# Patient Record
Sex: Female | Born: 1970 | Race: White | Hispanic: No | Marital: Married | Smoking: Never smoker
Health system: Southern US, Community
[De-identification: ages and names within clinical notes are randomized; demographics above are authoritative.]

## PROBLEM LIST (undated history)

## (undated) DIAGNOSIS — J302 Other seasonal allergic rhinitis: Secondary | ICD-10-CM

## (undated) DIAGNOSIS — G43909 Migraine, unspecified, not intractable, without status migrainosus: Secondary | ICD-10-CM

## (undated) DIAGNOSIS — E559 Vitamin D deficiency, unspecified: Secondary | ICD-10-CM

## (undated) DIAGNOSIS — K529 Noninfective gastroenteritis and colitis, unspecified: Secondary | ICD-10-CM

## (undated) DIAGNOSIS — F419 Anxiety disorder, unspecified: Secondary | ICD-10-CM

## (undated) DIAGNOSIS — I499 Cardiac arrhythmia, unspecified: Secondary | ICD-10-CM

## (undated) DIAGNOSIS — Z87442 Personal history of urinary calculi: Secondary | ICD-10-CM

## (undated) DIAGNOSIS — I10 Essential (primary) hypertension: Secondary | ICD-10-CM

## (undated) DIAGNOSIS — L570 Actinic keratosis: Secondary | ICD-10-CM

## (undated) DIAGNOSIS — E785 Hyperlipidemia, unspecified: Secondary | ICD-10-CM

## (undated) DIAGNOSIS — R9439 Abnormal result of other cardiovascular function study: Secondary | ICD-10-CM

## (undated) HISTORY — DX: Hyperlipidemia, unspecified: E78.5

## (undated) HISTORY — DX: Essential (primary) hypertension: I10

## (undated) HISTORY — DX: Noninfective gastroenteritis and colitis, unspecified: K52.9

## (undated) HISTORY — DX: Abnormal result of other cardiovascular function study: R94.39

## (undated) HISTORY — DX: Actinic keratosis: L57.0

## (undated) HISTORY — DX: Anxiety disorder, unspecified: F41.9

## (undated) HISTORY — DX: Migraine, unspecified, not intractable, without status migrainosus: G43.909

## (undated) HISTORY — DX: Other seasonal allergic rhinitis: J30.2

## (undated) HISTORY — DX: Vitamin D deficiency, unspecified: E55.9

---

## 2002-06-01 ENCOUNTER — Ambulatory Visit (HOSPITAL_COMMUNITY): Admission: RE | Admit: 2002-06-01 | Discharge: 2002-06-01 | Payer: Self-pay | Admitting: Obstetrics and Gynecology

## 2002-06-01 ENCOUNTER — Encounter: Payer: Self-pay | Admitting: Obstetrics and Gynecology

## 2002-06-14 ENCOUNTER — Inpatient Hospital Stay (HOSPITAL_COMMUNITY): Admission: AD | Admit: 2002-06-14 | Discharge: 2002-06-14 | Payer: Self-pay | Admitting: Obstetrics and Gynecology

## 2002-07-25 ENCOUNTER — Inpatient Hospital Stay (HOSPITAL_COMMUNITY): Admission: AD | Admit: 2002-07-25 | Discharge: 2002-07-29 | Payer: Self-pay | Admitting: Obstetrics and Gynecology

## 2004-01-17 ENCOUNTER — Emergency Department (HOSPITAL_COMMUNITY): Admission: EM | Admit: 2004-01-17 | Discharge: 2004-01-17 | Payer: Self-pay | Admitting: Emergency Medicine

## 2006-04-24 LAB — HM MAMMOGRAPHY: HM Mammogram: NORMAL

## 2006-07-19 ENCOUNTER — Inpatient Hospital Stay (HOSPITAL_COMMUNITY): Admission: AD | Admit: 2006-07-19 | Discharge: 2006-07-19 | Payer: Self-pay | Admitting: Obstetrics and Gynecology

## 2006-10-27 ENCOUNTER — Inpatient Hospital Stay (HOSPITAL_COMMUNITY): Admission: AD | Admit: 2006-10-27 | Discharge: 2006-10-29 | Payer: Self-pay | Admitting: Obstetrics and Gynecology

## 2007-04-12 ENCOUNTER — Emergency Department: Payer: Self-pay | Admitting: Emergency Medicine

## 2007-04-12 ENCOUNTER — Other Ambulatory Visit: Payer: Self-pay

## 2008-01-21 ENCOUNTER — Ambulatory Visit: Payer: Self-pay | Admitting: Family Medicine

## 2010-03-03 ENCOUNTER — Ambulatory Visit: Payer: Self-pay | Admitting: Internal Medicine

## 2011-10-15 ENCOUNTER — Ambulatory Visit: Payer: Self-pay | Admitting: Internal Medicine

## 2011-10-23 ENCOUNTER — Encounter: Payer: Self-pay | Admitting: Internal Medicine

## 2011-10-23 ENCOUNTER — Ambulatory Visit (INDEPENDENT_AMBULATORY_CARE_PROVIDER_SITE_OTHER): Payer: 59 | Admitting: Internal Medicine

## 2011-10-23 VITALS — BP 128/78 | HR 61 | Temp 98.4°F | Ht 66.0 in | Wt 139.0 lb

## 2011-10-23 DIAGNOSIS — Z0000 Encounter for general adult medical examination without abnormal findings: Secondary | ICD-10-CM

## 2011-10-23 DIAGNOSIS — J45909 Unspecified asthma, uncomplicated: Secondary | ICD-10-CM

## 2011-10-23 DIAGNOSIS — R197 Diarrhea, unspecified: Secondary | ICD-10-CM

## 2011-10-23 NOTE — Patient Instructions (Signed)
Labs next week. Start Align for 1 week to see if any improvement. Follow up 1 month.

## 2011-10-24 DIAGNOSIS — J45909 Unspecified asthma, uncomplicated: Secondary | ICD-10-CM | POA: Insufficient documentation

## 2011-10-24 DIAGNOSIS — R197 Diarrhea, unspecified: Secondary | ICD-10-CM | POA: Insufficient documentation

## 2011-10-24 NOTE — Progress Notes (Signed)
Subjective:    Patient ID: Briana Klein, female    DOB: 09-24-1971, 40 y.o.   MRN: [id number]  HPI 40 year old female presents to establish care. Her primary concern today is a several year history of intermittent diarrhea and crampy abdominal pain. She notes that several times per week after eating a meal or in situations of increased anxiety she develops cramping in her abdomen and sudden watery diarrhea. Occasionally the diarrhea has some mucus. She does not report any blood in her stool or black stool. She notes that she has lost weight as a result of this diarrhea. It is also limited her ability to function because she is constantly worried about episodes of explosive diarrhea. She was evaluated by her previous physician by a barium enema which she reports was normal. She has never had a colonoscopy. She has never had a CT of her abdomen. She reports that irritable bowel syndrome is prevalent in her family. She denies any family history of Crohn's disease or ulcerative colitis. She denies any fever or chills. She denies any arthralgias. She denies any skin changes.  She is also concerned about a history of asthma. She notes that she was diagnosed with asthma after having a stress test which was reportedly abnormal. She reports that after her stress test which was obtained for chest tightness she was placed on albuterol as needed. She is not a smoker. She continues to have some chest tightness and cough on a regular basis. She is unsure whether or not she ever had spirometry. She did not have asthma as a child. She does not exercise on a regular basis.  Outpatient Encounter Prescriptions as of 10/23/2011  Medication Sig Dispense Refill  . albuterol (VENTOLIN HFA) 108 (90 BASE) MCG/ACT inhaler Inhale 2 puffs into the lungs every 6 (six) hours as needed.        . ALPRAZolam (XANAX) 0.25 MG tablet Take 0.25 mg by mouth at bedtime as needed. For asthma attack       . PRESCRIPTION MEDICATION Advair  Inhaler ? dose         Review of Systems  Constitutional: Negative for fever, chills, appetite change, fatigue and unexpected weight change.  HENT: Negative for ear pain, congestion, sore throat, trouble swallowing, neck pain, voice change and sinus pressure.   Eyes: Negative for visual disturbance.  Respiratory: Positive for chest tightness and shortness of breath. Negative for cough, wheezing and stridor.   Cardiovascular: Negative for chest pain, palpitations and leg swelling.  Gastrointestinal: Positive for abdominal pain and diarrhea. Negative for nausea, vomiting, constipation, blood in stool, abdominal distention and anal bleeding.  Genitourinary: Negative for dysuria and flank pain.  Musculoskeletal: Negative for myalgias, arthralgias and gait problem.  Skin: Negative for color change and rash.  Neurological: Negative for dizziness and headaches.  Hematological: Negative for adenopathy. Does not bruise/bleed easily.  Psychiatric/Behavioral: Negative for suicidal ideas, sleep disturbance and dysphoric mood. The patient is not nervous/anxious.    BP 128/78  Pulse 61  Temp(Src) 98.4 F (36.9 C) (Oral)  Ht 5\' 6"  (1.676 m)  Wt 139 lb (63.05 kg)  BMI 22.44 kg/m2  SpO2 99%  LMP 10/10/2011     Objective:   Physical Exam  Constitutional: She is oriented to person, place, and time. She appears well-developed and well-nourished. No distress.  HENT:  Head: Normocephalic and atraumatic.  Right Ear: External ear normal.  Left Ear: External ear normal.  Nose: Nose normal.  Mouth/Throat: Oropharynx is clear and moist. No  oropharyngeal exudate.  Eyes: Conjunctivae are normal. Pupils are equal, round, and reactive to light. Right eye exhibits no discharge. Left eye exhibits no discharge. No scleral icterus.  Neck: Normal range of motion. Neck supple. No tracheal deviation present. No thyromegaly present.  Cardiovascular: Normal rate, regular rhythm, normal heart sounds and intact distal  pulses.  Exam reveals no gallop and no friction rub.   No murmur heard. Pulmonary/Chest: Effort normal and breath sounds normal. No respiratory distress. She has no wheezes. She has no rales. She exhibits no tenderness.  Abdominal: Soft. Bowel sounds are normal. She exhibits no distension and no mass. There is no tenderness. There is no rebound and no guarding.  Musculoskeletal: Normal range of motion. She exhibits no edema and no tenderness.  Lymphadenopathy:    She has no cervical adenopathy.  Neurological: She is alert and oriented to person, place, and time. No cranial nerve deficit. She exhibits normal muscle tone. Coordination normal.  Skin: Skin is warm and dry. No rash noted. She is not diaphoretic. No erythema. No pallor.  Psychiatric: She has a normal mood and affect. Her behavior is normal. Judgment and thought content normal.          Assessment & Plan:  1. Diarrhea and abdominal pain - Chronic. No specific food triggers.  Question IBD. Will check CBC, CMP, lactose tolerance, celiac panel with labs.  Pt will likely need colonoscopy.  Will get old records for review. Will start probiotic. Follow up 1 month.  2. Chest tightness/Dyspnea - Pt reports dx of asthma in the past based on abnormal stress test?  Will get old records for review.  We discussed that spirometry might be helpful. Will review records to see if this was completed in the past.  3. Anxiety - Will continue prn alprazolam.

## 2011-11-03 ENCOUNTER — Telehealth: Payer: Self-pay | Admitting: Internal Medicine

## 2011-11-03 NOTE — Telephone Encounter (Signed)
Labs from [facility] including blood counts, kidney and liver function, thyroid function, testing for lactose intolerance and testing for celiac were all normal.

## 2011-11-03 NOTE — Telephone Encounter (Signed)
Attempt to contact patient--home phone keeps saying "number cannot be called as dialed"?

## 2011-11-09 NOTE — Telephone Encounter (Signed)
No answer on hm #, called Wk # - Spoke w/pt and informed of results, also added cell # in chart

## 2011-11-18 ENCOUNTER — Encounter: Payer: Self-pay | Admitting: Internal Medicine

## 2011-11-25 ENCOUNTER — Encounter: Payer: Self-pay | Admitting: Internal Medicine

## 2011-11-25 ENCOUNTER — Ambulatory Visit (INDEPENDENT_AMBULATORY_CARE_PROVIDER_SITE_OTHER): Payer: 59 | Admitting: Internal Medicine

## 2011-11-25 ENCOUNTER — Other Ambulatory Visit (HOSPITAL_COMMUNITY)
Admission: RE | Admit: 2011-11-25 | Discharge: 2011-11-25 | Disposition: A | Discharge status: Home / Self Care | Payer: 59 | Source: Ambulatory Visit | Attending: Internal Medicine | Admitting: Internal Medicine

## 2011-11-25 DIAGNOSIS — K589 Irritable bowel syndrome without diarrhea: Secondary | ICD-10-CM

## 2011-11-25 DIAGNOSIS — F411 Generalized anxiety disorder: Secondary | ICD-10-CM

## 2011-11-25 DIAGNOSIS — Z0000 Encounter for general adult medical examination without abnormal findings: Secondary | ICD-10-CM

## 2011-11-25 DIAGNOSIS — Z01419 Encounter for gynecological examination (general) (routine) without abnormal findings: Secondary | ICD-10-CM | POA: Insufficient documentation

## 2011-11-25 DIAGNOSIS — F419 Anxiety disorder, unspecified: Secondary | ICD-10-CM | POA: Insufficient documentation

## 2011-11-25 DIAGNOSIS — Z124 Encounter for screening for malignant neoplasm of cervix: Secondary | ICD-10-CM

## 2011-11-25 DIAGNOSIS — Z1159 Encounter for screening for other viral diseases: Secondary | ICD-10-CM | POA: Insufficient documentation

## 2011-11-25 DIAGNOSIS — J45909 Unspecified asthma, uncomplicated: Secondary | ICD-10-CM

## 2011-11-25 MED ORDER — FLUTICASONE-SALMETEROL 115-21 MCG/ACT IN AERO: 2.0000 | INHALATION_SPRAY | Freq: Two times a day (BID) | RESPIRATORY_TRACT | Status: DC | PRN

## 2011-11-25 MED ORDER — ALIGN 4 MG PO CAPS: 4.0000 mg | ORAL_CAPSULE | Freq: Every day | ORAL | Status: DC

## 2011-11-25 MED ORDER — ALPRAZOLAM 0.25 MG PO TABS: 0.2500 mg | ORAL_TABLET | Freq: Every evening | ORAL | Status: DC | PRN

## 2011-11-25 MED ORDER — ALBUTEROL SULFATE HFA 108 (90 BASE) MCG/ACT IN AERS: 2.0000 | INHALATION_SPRAY | Freq: Four times a day (QID) | RESPIRATORY_TRACT | Status: DC | PRN

## 2011-11-25 NOTE — Progress Notes (Signed)
Subjective:    Patient ID: Briana Klein, female    DOB: May 06, 1971, 40 y.o.   MRN: [id number]  HPI 40 year old female presents for her annual exam. At her last visit, she complained of frequent episodes of watery diarrhea. We started a probiotic and she reports significant improvement with this. She denies any recurrent abdominal pain or diarrhea. She does now appreciate that she has some food triggers and she has been avoiding these. We reviewed labs from her last visit including celiac panel and testing for lactose intolerance which were both negative.  She reports that she's been feeling well. She denies any fatigue. She reports normal appetite and activity level.  Outpatient Encounter Prescriptions as of 11/25/2011  Medication Sig Dispense Refill  . albuterol (VENTOLIN HFA) 108 (90 BASE) MCG/ACT inhaler Inhale 2 puffs into the lungs every 6 (six) hours as needed.  18 g  3  . ALPRAZolam (XANAX) 0.25 MG tablet Take 1 tablet (0.25 mg total) by mouth at bedtime as needed. For asthma attack  30 tablet  3  . fluticasone-salmeterol (ADVAIR HFA) 115-21 MCG/ACT inhaler Inhale 2 puffs into the lungs 2 (two) times daily as needed.  1 Inhaler  3  . DISCONTD: albuterol (VENTOLIN HFA) 108 (90 BASE) MCG/ACT inhaler Inhale 2 puffs into the lungs every 6 (six) hours as needed.        Marland Kitchen DISCONTD: ALPRAZolam (XANAX) 0.25 MG tablet Take 0.25 mg by mouth at bedtime as needed. For asthma attack       . DISCONTD: fluticasone-salmeterol (ADVAIR HFA) 115-21 MCG/ACT inhaler Inhale 2 puffs into the lungs 2 (two) times daily as needed.        . Probiotic Product (ALIGN) 4 MG CAPS Take 4 mg by mouth daily.  30 capsule  3    Review of Systems  Constitutional: Negative for fever, chills, appetite change, fatigue and unexpected weight change.  HENT: Negative for ear pain, congestion, sore throat, trouble swallowing, neck pain, voice change and sinus pressure.   Eyes: Negative for visual disturbance.  Respiratory:  Negative for cough, shortness of breath, wheezing and stridor.   Cardiovascular: Negative for chest pain, palpitations and leg swelling.  Gastrointestinal: Negative for nausea, vomiting, abdominal pain, diarrhea, constipation, blood in stool, abdominal distention and anal bleeding.  Genitourinary: Negative for dysuria and flank pain.  Musculoskeletal: Negative for myalgias, arthralgias and gait problem.  Skin: Negative for color change and rash.  Neurological: Negative for dizziness and headaches.  Hematological: Negative for adenopathy. Does not bruise/bleed easily.  Psychiatric/Behavioral: Negative for suicidal ideas, sleep disturbance and dysphoric mood. The patient is not nervous/anxious.    BP 124/72  Pulse 69  Temp(Src) 98.1 F (36.7 C) (Oral)  Wt 139 lb (63.05 kg)  SpO2 98%     Objective:   Physical Exam  Constitutional: She is oriented to person, place, and time. She appears well-developed and well-nourished. No distress.  HENT:  Head: Normocephalic and atraumatic.  Right Ear: External ear normal.  Left Ear: External ear normal.  Nose: Nose normal.  Mouth/Throat: Oropharynx is clear and moist. No oropharyngeal exudate.  Eyes: Conjunctivae are normal. Pupils are equal, round, and reactive to light. Right eye exhibits no discharge. Left eye exhibits no discharge. No scleral icterus.  Neck: Normal range of motion. Neck supple. No tracheal deviation present. No thyromegaly present.  Cardiovascular: Normal rate, regular rhythm, normal heart sounds and intact distal pulses.  Exam reveals no gallop and no friction rub.   No murmur heard. Pulmonary/Chest: Effort  normal and breath sounds normal. No respiratory distress. She has no wheezes. She has no rales. She exhibits no tenderness. Right breast exhibits no inverted nipple, no mass, no nipple discharge, no skin change and no tenderness. Left breast exhibits no inverted nipple, no mass, no nipple discharge, no skin change and no  tenderness. Breasts are symmetrical.  Abdominal: Soft. Bowel sounds are normal. She exhibits no distension and no mass. There is no tenderness. There is no rebound and no guarding.  Genitourinary: Vagina normal and uterus normal. No breast swelling, tenderness, discharge or bleeding. Pelvic exam was performed with patient prone. There is no rash, tenderness or lesion on the right labia. There is no rash, tenderness or lesion on the left labia. Uterus is not enlarged and not tender. Cervix exhibits discharge. Cervix exhibits no motion tenderness and no friability. Right adnexum displays no mass, no tenderness and no fullness. Left adnexum displays no mass, no tenderness and no fullness. No erythema or tenderness around the vagina. No vaginal discharge found.  Musculoskeletal: Normal range of motion. She exhibits no edema and no tenderness.  Lymphadenopathy:    She has no cervical adenopathy.  Neurological: She is alert and oriented to person, place, and time. No cranial nerve deficit. She exhibits normal muscle tone. Coordination normal.  Skin: Skin is warm and dry. No rash noted. She is not diaphoretic. No erythema. No pallor.  Psychiatric: She has a normal mood and affect. Her behavior is normal. Judgment and thought content normal.          Assessment & Plan:  1. General exam - Exam including breast and pelvic exam normal today.  PAP pending.  Reviewed recent labs including CBC, CMP, lipids which were normal. Follow up 6 months and prn.  2. IBS - Improved with Align. Will continue. Follow up prn.

## 2012-04-06 ENCOUNTER — Telehealth: Payer: Self-pay | Admitting: Internal Medicine

## 2012-04-06 DIAGNOSIS — K589 Irritable bowel syndrome without diarrhea: Secondary | ICD-10-CM

## 2012-04-06 NOTE — Telephone Encounter (Signed)
Patient called and wanted to know if she could have paper copies of all her prescriptions to verify they belong to her while going through [employer].  She also wanted to know if you could recommend an allergy or sinus med to help with symptoms due to flying.  Please advise.

## 2012-04-06 NOTE — Telephone Encounter (Signed)
Fine to print out paper Rx.  Would recommend using Claritin D for allergies.

## 2012-04-07 NOTE — Telephone Encounter (Signed)
Left message asking patient to call us back. 

## 2012-04-07 NOTE — Telephone Encounter (Signed)
Patient returned call and says that she does not need for this to be on rx paper. She just needs a copy of med list with MD signature. Printed med list for signature and patient will come by and pick it up

## 2012-05-26 ENCOUNTER — Ambulatory Visit: Payer: 59 | Admitting: Internal Medicine

## 2012-12-08 ENCOUNTER — Ambulatory Visit (INDEPENDENT_AMBULATORY_CARE_PROVIDER_SITE_OTHER): Payer: 59 | Admitting: Internal Medicine

## 2012-12-08 ENCOUNTER — Encounter: Payer: Self-pay | Admitting: Internal Medicine

## 2012-12-08 VITALS — BP 130/82 | HR 67 | Temp 97.7°F | Resp 16 | Wt 144.5 lb

## 2012-12-08 DIAGNOSIS — J45901 Unspecified asthma with (acute) exacerbation: Secondary | ICD-10-CM

## 2012-12-08 DIAGNOSIS — J209 Acute bronchitis, unspecified: Secondary | ICD-10-CM

## 2012-12-08 DIAGNOSIS — J45909 Unspecified asthma, uncomplicated: Secondary | ICD-10-CM | POA: Insufficient documentation

## 2012-12-08 DIAGNOSIS — F419 Anxiety disorder, unspecified: Secondary | ICD-10-CM

## 2012-12-08 DIAGNOSIS — F411 Generalized anxiety disorder: Secondary | ICD-10-CM

## 2012-12-08 MED ORDER — ALBUTEROL SULFATE HFA 108 (90 BASE) MCG/ACT IN AERS: 2.0000 | INHALATION_SPRAY | Freq: Four times a day (QID) | RESPIRATORY_TRACT | Status: DC | PRN

## 2012-12-08 MED ORDER — ALPRAZOLAM 0.25 MG PO TABS: 0.2500 mg | ORAL_TABLET | Freq: Every evening | ORAL | Status: DC | PRN

## 2012-12-08 MED ORDER — FLUTICASONE-SALMETEROL 115-21 MCG/ACT IN AERO: 2.0000 | INHALATION_SPRAY | Freq: Two times a day (BID) | RESPIRATORY_TRACT | Status: DC | PRN

## 2012-12-08 MED ORDER — HYDROCODONE-ACETAMINOPHEN 5-325 MG PO TABS: 1.0000 | ORAL_TABLET | Freq: Four times a day (QID) | ORAL | Status: DC | PRN

## 2012-12-08 MED ORDER — PREDNISONE (PAK) 10 MG PO TABS: ORAL_TABLET | ORAL | Status: DC

## 2012-12-08 MED ORDER — CEFDINIR 300 MG PO CAPS: 300.0000 mg | ORAL_CAPSULE | Freq: Two times a day (BID) | ORAL | Status: DC

## 2012-12-08 NOTE — Assessment & Plan Note (Signed)
Symptoms are most consistent with asthma exacerbation secondary to bronchitis. Will start prednisone taper, omnicef.  Will refill albuterol and advair. Pt will use Hydrocodone as needed for cough. Pt will call if symptoms not improving over next 48hr, or if any worsening shortness of breath, chest pain, fever.  Follow up prn.

## 2012-12-08 NOTE — Progress Notes (Signed)
Subjective:    Patient ID: Briana Klein, female    DOB: 1971/02/19, 41 y.o.   MRN: [id number]  HPI 41YO female with h/o asthma presents for acute visit. Complains of 6 day h/o nasal congestion, sinus pressure, sore throat, cough productive of yellow-gray sputum, shortness of breath. Denies fever, chills chest pain. Has been taking OTC cough and cold preparations with decongestants with no improvement.  Has been out of albuterol and advair.  Outpatient Encounter Prescriptions as of 12/08/2012  Medication Sig Dispense Refill  . albuterol (VENTOLIN HFA) 108 (90 BASE) MCG/ACT inhaler Inhale 2 puffs into the lungs every 6 (six) hours as needed.  18 g  3  . ALPRAZolam (XANAX) 0.25 MG tablet Take 1 tablet (0.25 mg total) by mouth at bedtime as needed. For asthma attack  30 tablet  3  . fluticasone-salmeterol (ADVAIR HFA) 115-21 MCG/ACT inhaler Inhale 2 puffs into the lungs 2 (two) times daily as needed.  1 Inhaler  3  . Probiotic Product (ALIGN) 4 MG CAPS Take 4 mg by mouth daily.  30 capsule  3  . cefdinir (OMNICEF) 300 MG capsule Take 1 capsule (300 mg total) by mouth 2 (two) times daily.  20 capsule  0  . HYDROcodone-acetaminophen (NORCO/VICODIN) 5-325 MG per tablet Take 1 tablet by mouth every 6 (six) hours as needed (cough).  60 tablet  0  . predniSONE (STERAPRED UNI-PAK) 10 MG tablet Take 60mg  day 1 then taper by 10mg  daily  21 tablet  0   BP 130/82  Pulse 67  Temp 97.7 F (36.5 C) (Oral)  Resp 16  Wt 144 lb 8 oz (65.545 kg)  SpO2 98%  LMP 12/01/2012  Review of Systems  Constitutional: Positive for fatigue. Negative for fever, chills, appetite change and unexpected weight change.  HENT: Positive for congestion, rhinorrhea, postnasal drip and sinus pressure. Negative for ear pain, sore throat, trouble swallowing, neck pain and voice change.   Eyes: Negative for visual disturbance.  Respiratory: Positive for cough, chest tightness and shortness of breath. Negative for wheezing and  stridor.   Cardiovascular: Negative for chest pain, palpitations and leg swelling.  Gastrointestinal: Negative for nausea, vomiting, abdominal pain, diarrhea, constipation, blood in stool, abdominal distention and anal bleeding.  Genitourinary: Negative for dysuria and flank pain.  Musculoskeletal: Negative for myalgias, arthralgias and gait problem.  Skin: Negative for color change and rash.  Neurological: Negative for dizziness and headaches.  Hematological: Negative for adenopathy. Does not bruise/bleed easily.  Psychiatric/Behavioral: Negative for suicidal ideas, sleep disturbance and dysphoric mood. The patient is not nervous/anxious.        Objective:   Physical Exam  Constitutional: She is oriented to person, place, and time. She appears well-developed and well-nourished. No distress.  HENT:  Head: Normocephalic and atraumatic.  Right Ear: External ear normal.  Left Ear: External ear normal.  Nose: Nose normal.  Mouth/Throat: Oropharynx is clear and moist. No oropharyngeal exudate.  Eyes: Conjunctivae normal are normal. Pupils are equal, round, and reactive to light. Right eye exhibits no discharge. Left eye exhibits no discharge. No scleral icterus.  Neck: Normal range of motion. Neck supple. No tracheal deviation present. No thyromegaly present.  Cardiovascular: Normal rate, regular rhythm, normal heart sounds and intact distal pulses.  Exam reveals no gallop and no friction rub.   No murmur heard. Pulmonary/Chest: Effort normal. No accessory muscle usage. Not tachypneic. No respiratory distress. She has decreased breath sounds (prolonged expiration). She has no wheezes. She has rhonchi (few scattered  rhonchi). She has no rales. She exhibits no tenderness.  Musculoskeletal: Normal range of motion. She exhibits no edema and no tenderness.  Lymphadenopathy:    She has no cervical adenopathy.  Neurological: She is alert and oriented to person, place, and time. No cranial nerve  deficit. She exhibits normal muscle tone. Coordination normal.  Skin: Skin is warm and dry. No rash noted. She is not diaphoretic. No erythema. No pallor.  Psychiatric: She has a normal mood and affect. Her behavior is normal. Judgment and thought content normal.          Assessment & Plan:

## 2013-03-03 ENCOUNTER — Observation Stay: Payer: Self-pay | Admitting: Internal Medicine

## 2013-03-03 ENCOUNTER — Telehealth: Payer: Self-pay | Admitting: Internal Medicine

## 2013-03-03 ENCOUNTER — Ambulatory Visit: Payer: 59 | Admitting: Adult Health

## 2013-03-03 LAB — URINALYSIS, COMPLETE
Bilirubin,UR: NEGATIVE
Ketone: NEGATIVE
Ph: 7 (ref 4.5–8.0)
Protein: NEGATIVE
Specific Gravity: 1.013 (ref 1.003–1.030)
Squamous Epithelial: 6

## 2013-03-03 LAB — CBC
HGB: 13.9 g/dL (ref 12.0–16.0)
MCH: 31.1 pg (ref 26.0–34.0)
MCV: 88 fL (ref 80–100)
RBC: 4.46 10*6/uL (ref 3.80–5.20)
RDW: 14.5 % (ref 11.5–14.5)
WBC: 7.5 10*3/uL (ref 3.6–11.0)

## 2013-03-03 LAB — CK TOTAL AND CKMB (NOT AT ARMC)
CK, Total: 46 U/L (ref 21–215)
CK-MB: <0.5 ng/mL — ABNORMAL LOW (ref 0.5–3.6)

## 2013-03-03 LAB — PRO B NATRIURETIC PEPTIDE: B-Type Natriuretic Peptide: 58 pg/mL (ref 0–125)

## 2013-03-03 LAB — TROPONIN I: Troponin-I: <0.02 ng/mL

## 2013-03-03 NOTE — Telephone Encounter (Signed)
Patient Information:  Caller Name: Husna  Phone: [phone]  Patient: Briana Klein, Briana Klein  Gender: Female  DOB: 12/07/1971  Age: 42 Years  PCP: Ronna Polio (Adults only)  Pregnant: No  Office Follow Up:  Does the office need to follow up with this patient?: No  Instructions For The Office: N/A  RN Note:  Pt is having a coworker drive her to [facility] now for hypertension assessment.  Symptoms  Reason For Call & Symptoms: High Pressure 184/112 1st, 200/120-2nd, 178/110 rd at,1120. Feeling like she was going to pass out initially; had muffled hearing. Sitting at desk and now, resting, and feels somewhat better. Is having a tightness in chest but not pain. Feels like anxiety she has had in the past.  Reviewed Health History In EMR: Yes  Reviewed Medications In EMR: Yes  Reviewed Allergies In EMR: Yes  Reviewed Surgeries / Procedures: Yes  Date of Onset of Symptoms: 03/03/2013 OB / GYN:  LMP: 03/02/2013  Guideline(s) Used:  High Blood Pressure  Disposition Per Guideline:   Go to ED Now (or to Office with PCP Approval)  Reason For Disposition Reached:   BP > 160 / 100 and any cardiac or neurologic symptoms (e.g., chest pain, difficulty breathing, unsteady gait, blurred vision)  Advice Given:  Call Back If:  Headache, blurred vision, difficulty talking, or difficulty walking occurs  Chest pain or difficulty breathing occurs  You become worse.  Patient Will Follow Care Advice:  YES

## 2013-03-03 NOTE — Telephone Encounter (Signed)
Called left message on patient voicemail to see why she cancelled appointment with Raquel, if she was received medical treatment somewhere else.

## 2013-03-03 NOTE — Telephone Encounter (Signed)
No openings were available and patient had an appointment with Raquel but she cancelled it.

## 2013-03-03 NOTE — Telephone Encounter (Signed)
Pt had BP checked at work and it was extremely high.  Scheduled appt to see Raquel this afternoon at 3:45 as well as transferred pt to Triage.  Pt asked to please contact her with any other options of being seen sooner than 3:45.   May call work or cell.

## 2013-03-04 LAB — CBC WITH DIFFERENTIAL/PLATELET
Basophil #: 0.1 10*3/uL (ref 0.0–0.1)
Basophil %: 1.2 %
Eosinophil %: 3.3 %
HCT: 35.8 % (ref 35.0–47.0)
MCHC: 35.5 g/dL (ref 32.0–36.0)
MCV: 88 fL (ref 80–100)
Neutrophil #: 3.6 10*3/uL (ref 1.4–6.5)
Neutrophil %: 59.5 %
Platelet: 230 10*3/uL (ref 150–440)
RBC: 4.06 10*6/uL (ref 3.80–5.20)
RDW: 14.3 % (ref 11.5–14.5)
WBC: 6 10*3/uL (ref 3.6–11.0)

## 2013-03-04 LAB — BASIC METABOLIC PANEL
Anion Gap: 7 (ref 7–16)
BUN: 12 mg/dL (ref 7–18)
Calcium, Total: 8.3 mg/dL — ABNORMAL LOW (ref 8.5–10.1)
Chloride: 107 mmol/L (ref 98–107)
Co2: 27 mmol/L (ref 21–32)
EGFR (African American): >60

## 2013-03-04 LAB — CK TOTAL AND CKMB (NOT AT ARMC)
CK, Total: 36 U/L (ref 21–215)
CK-MB: <0.5 ng/mL — ABNORMAL LOW (ref 0.5–3.6)

## 2013-03-04 LAB — TROPONIN I: Troponin-I: <0.02 ng/mL

## 2013-03-06 ENCOUNTER — Ambulatory Visit (INDEPENDENT_AMBULATORY_CARE_PROVIDER_SITE_OTHER): Payer: 59 | Admitting: Adult Health

## 2013-03-06 ENCOUNTER — Telehealth: Payer: Self-pay | Admitting: Internal Medicine

## 2013-03-06 ENCOUNTER — Encounter: Payer: Self-pay | Admitting: Adult Health

## 2013-03-06 VITALS — BP 163/99 | HR 74 | Temp 98.1°F | Resp 14 | Ht 67.0 in | Wt 147.0 lb

## 2013-03-06 DIAGNOSIS — R9439 Abnormal result of other cardiovascular function study: Secondary | ICD-10-CM

## 2013-03-06 DIAGNOSIS — J45909 Unspecified asthma, uncomplicated: Secondary | ICD-10-CM

## 2013-03-06 DIAGNOSIS — I10 Essential (primary) hypertension: Secondary | ICD-10-CM | POA: Insufficient documentation

## 2013-03-06 DIAGNOSIS — Z09 Encounter for follow-up examination after completed treatment for conditions other than malignant neoplasm: Secondary | ICD-10-CM | POA: Insufficient documentation

## 2013-03-06 DIAGNOSIS — F419 Anxiety disorder, unspecified: Secondary | ICD-10-CM

## 2013-03-06 DIAGNOSIS — J302 Other seasonal allergic rhinitis: Secondary | ICD-10-CM

## 2013-03-06 MED ORDER — FEXOFENADINE HCL 180 MG PO TABS: 180.0000 mg | ORAL_TABLET | Freq: Every day | ORAL | Status: DC

## 2013-03-06 NOTE — Patient Instructions (Addendum)
  I have ordered Allegra 180 mg daily for allergies.   Take Bystolic 5 mg daily. Monitor your b/p daily to see how you respond to this medication. Bring your b/p readings to your next appointment with Dr. Dan Humphreys.  Start to use your Advair inhaler regularly. Do 2 puffs every 12 hours. Rinse your mouth well after using. Use your ventolin every 6 hours as needed for wheezing or shortness of breath.  Use your xanax 0.25 mg as needed. Try not to exceed two doses daily.

## 2013-03-06 NOTE — Telephone Encounter (Signed)
Patient to see Raquel today

## 2013-03-06 NOTE — Telephone Encounter (Signed)
Patient called to let us know she was admitted to hospital on Friday.

## 2013-03-06 NOTE — Assessment & Plan Note (Signed)
Start Allegra 180 mg daily. Instructed patient to avoid medications with decongestants at this time as these will elevate her blood pressure.

## 2013-03-06 NOTE — Assessment & Plan Note (Signed)
Increasing episodes of anxiety. Increase Xanax 0.25 mg to twice a day when necessary.

## 2013-03-06 NOTE — Addendum Note (Signed)
Addended by: Novella Olive on: 03/06/2013 08:08 PM   Modules accepted: Level of Service

## 2013-03-06 NOTE — Telephone Encounter (Signed)
Patient was admitted to the hospital Friday March 03, 2013 . She is wanting to be seen sooner than Friday  March 28.

## 2013-03-06 NOTE — Progress Notes (Signed)
Subjective:    Patient ID: Briana Klein, female    DOB: Jul 07, 1971, 42 y.o.   MRN: [id number]  HPI  Patient is a pleasant 42 year old female with history of migraine headaches, asthma who presents to clinic for followup of recent hospitalization. Patient was hospitalized at [facility] from March 21 through March 04, 2013 for lightheadedness, chest tightness and malignant hypertension. Initial EKG was abnormal showing T-wave inversion and ST segment depression inferiolaterally. Electrolytes showed hypokalemia and hypomagnesemia which were replaced during hospitalization. Troponin was < 0.02 x 3. She was evaluated by Dr. Juliann Pares and is s/p stress test and echo. Note that discharge summary reported negative stress test; however, review of actual stress test shows some discrepancy as follows:   "No evidence for ischemia. Scan findings show an abnormal appearance. Basal inferioseptal segment is abnormal. Apex, mid and apical anterior wall, apical lateral segment, and apical inferior segment are abnormal".  Patient reports having a history of "asthma". She reports having episodes where she feels she cannot get a full breath. Advair is ordered but the patient is only using this as needed. She also has a rescue inhaler. Also experiencing episodes of anxiety. She is uncertain which symptom she experiences first. In addition, patient feels that allergies may also be contributing to her symptoms. She has been taking over-the-counter medications with decongestants.   Past Medical History  Diagnosis Date  . Chronic diarrhea   . Anxiety   . Asthma   . Abnormal stress test     Dr. Oleta Mouse  . Seasonal allergies     Family History  Problem Relation Age of Onset  . Hypertension Mother   . Heart disease Father   . Diabetes Father   . Stroke Father   . Breast cancer Maternal Aunt   . Breast cancer Paternal Grandmother   . Heart disease Paternal Grandfather   . Diabetes Other     History   Social  History  . Marital Status: Married    Spouse Name: N/A    Number of Children: 3  . Years of Education: N/A   Occupational History  . [employer]   Social History Main Topics  . Smoking status: Never Smoker   . Smokeless tobacco: Never Used  . Alcohol Use: Yes     Comment: Rarely  . Drug Use: No  . Sexually Active: Not on file   Other Topics Concern  . Not on file   Social History Narrative   Lives in [place]. Works in [employer] at [employer]. 3 children. 2 cats and dog. Diet: regular.    Daily Caffeine Use:  1 sweet tea and 1 soda   Regular Exercise -  NO             Current Outpatient Prescriptions on File Prior to Visit  Medication Sig Dispense Refill  . albuterol (VENTOLIN HFA) 108 (90 BASE) MCG/ACT inhaler Inhale 2 puffs into the lungs every 6 (six) hours as needed.  18 g  3  . ALPRAZolam (XANAX) 0.25 MG tablet Take 1 tablet (0.25 mg total) by mouth at bedtime as needed. For asthma attack  30 tablet  3  . fluticasone-salmeterol (ADVAIR HFA) 115-21 MCG/ACT inhaler Inhale 2 puffs into the lungs 2 (two) times daily as needed.  1 Inhaler  3  . Probiotic Product (ALIGN) 4 MG CAPS Take 4 mg by mouth daily.  30 capsule  3   No current facility-administered medications on file prior to visit.  Review of Systems  Constitutional: Negative for fever, chills and appetite change.  HENT: Positive for rhinorrhea, sneezing and postnasal drip.   Eyes: Negative.   Respiratory: Negative for cough, chest tightness, shortness of breath and wheezing.   Cardiovascular: Negative for chest pain, palpitations and leg swelling.  Gastrointestinal: Negative.   Allergic/Immunologic: Positive for environmental allergies.  Psychiatric/Behavioral: The patient is nervous/anxious.    BP 163/99  Pulse 74  Temp(Src) 98.1 F (36.7 C)  Resp 14  Ht 5\' 7"  (1.702 m)  Wt 147 lb (66.679 kg)  BMI 23.02 kg/m2  SpO2 99%  LMP 02/14/2013     Objective:   Physical Exam   Constitutional: She is oriented to person, place, and time. She appears well-developed and well-nourished.  Appears anxious  HENT:  Head: Normocephalic and atraumatic.  Cardiovascular: Normal rate, regular rhythm, normal heart sounds and intact distal pulses.  Exam reveals no gallop.   No murmur heard. Pulmonary/Chest: Effort normal and breath sounds normal. No respiratory distress. She has no wheezes. She has no rales. She exhibits no tenderness.  Abdominal: Soft. Bowel sounds are normal.  Musculoskeletal: Normal range of motion. She exhibits no edema.  Neurological: She is alert and oriented to person, place, and time.  Skin: Skin is warm and dry.  Psychiatric: She has a normal mood and affect. Her behavior is normal. Judgment and thought content normal.      Assessment & Plan:

## 2013-03-06 NOTE — Assessment & Plan Note (Addendum)
Patient was recently admitted for lightheadedness and chest tightness. There is some discrepancy in the stress test results. I think it would be prudent to have the patient followup with cardiology. I will make referral. Her blood pressure is also elevated and I will add bystolic 5 mg daily. Patient is scheduled to be seen back in the office this Friday. I have asked patient to bring in blood pressure readings. Note, greater than 40 minutes were spent in face-to-face communication with patient in establishing care for multiple problems listed.

## 2013-03-06 NOTE — Assessment & Plan Note (Signed)
Blood pressure is still elevated. On recheck it was 150/98. Patient was given Coreg in the hospital however this was discontinued secondary to low blood pressure. She was discharged on no blood pressure medication. I will start a trial of bystolic 5 mg daily. Patient will return for followup on Friday.

## 2013-03-06 NOTE — Assessment & Plan Note (Signed)
Patient is not using Advair appropriately. I have instructed her that this is not a rescue inhaler and needs to be used regularly. She is to take 2 puffs into the lungs twice a day. The Ventolin inhaler is a rescue inhaler and she is to use this as needed including prior to exercising.

## 2013-03-10 ENCOUNTER — Ambulatory Visit (INDEPENDENT_AMBULATORY_CARE_PROVIDER_SITE_OTHER): Payer: 59 | Admitting: Internal Medicine

## 2013-03-10 ENCOUNTER — Telehealth: Payer: Self-pay | Admitting: Internal Medicine

## 2013-03-10 ENCOUNTER — Encounter: Payer: Self-pay | Admitting: Internal Medicine

## 2013-03-10 VITALS — BP 130/80 | HR 62 | Temp 98.2°F | Wt 146.0 lb

## 2013-03-10 DIAGNOSIS — I10 Essential (primary) hypertension: Secondary | ICD-10-CM

## 2013-03-10 DIAGNOSIS — R55 Syncope and collapse: Secondary | ICD-10-CM

## 2013-03-10 DIAGNOSIS — Z0000 Encounter for general adult medical examination without abnormal findings: Secondary | ICD-10-CM

## 2013-03-10 MED ORDER — NEBIVOLOL HCL 5 MG PO TABS: 5.0000 mg | ORAL_TABLET | Freq: Every day | ORAL | Status: DC

## 2013-03-10 NOTE — Telephone Encounter (Signed)
Fwd to Dr. Walker 

## 2013-03-10 NOTE — Progress Notes (Signed)
Subjective:    Patient ID: Briana Klein, female    DOB: [birth year], 42 y.o.   MRN: [id number]  HPI 41YO female presents to follow up HTN. Recently admitted at [facility], after having episode at work with sudden onset lightheadedness, followed by elevated BP >200/100.  She denies chest pain, palpitations during that time. She reports initial EKG in the ED was abnormal with T-wave changes. Cardiac markers were normal. Stress test performed during admission was reportedly normal. She was discharged home without BP medications. She returned to clinic 03/06/2013 and was noted to have elevated BP. She was started on Bystolic. BP at home have been 110-130s/70-80s. HR in 50-60s. She denies any recurrent episodes of lightheadedness. She reports she is feeling well. Denies any recent stressors, increased anxiety, new supplement medications, alcohol or caffeine intake.  Outpatient Encounter Prescriptions as of 03/10/2013  Medication Sig Dispense Refill  . albuterol (VENTOLIN HFA) 108 (90 BASE) MCG/ACT inhaler Inhale 2 puffs into the lungs every 6 (six) hours as needed.  18 g  3  . ALPRAZolam (XANAX) 0.25 MG tablet Take 1 tablet (0.25 mg total) by mouth at bedtime as needed. For asthma attack  30 tablet  3  . fexofenadine (ALLEGRA) 180 MG tablet Take 1 tablet (180 mg total) by mouth daily.  30 tablet  6  . fluticasone-salmeterol (ADVAIR HFA) 115-21 MCG/ACT inhaler Inhale 2 puffs into the lungs 2 (two) times daily as needed.  1 Inhaler  3  . nebivolol (BYSTOLIC) 5 MG tablet Take 1 tablet (5 mg total) by mouth daily.  30 tablet  3  . Probiotic Product (ALIGN) 4 MG CAPS Take 4 mg by mouth daily.  30 capsule  3   No facility-administered encounter medications on file as of 03/10/2013.   BP 130/80  Pulse 62  Temp(Src) 98.2 F (36.8 C) (Oral)  Wt 146 lb (66.225 kg)  BMI 22.86 kg/m2  SpO2 98%  LMP 02/14/2013  Review of Systems  Constitutional: Negative for fever, chills, appetite change, fatigue and unexpected  weight change.  HENT: Negative for ear pain, congestion, sore throat, trouble swallowing, neck pain, voice change and sinus pressure.   Eyes: Negative for visual disturbance.  Respiratory: Negative for cough, shortness of breath, wheezing and stridor.   Cardiovascular: Negative for chest pain, palpitations and leg swelling.  Gastrointestinal: Negative for nausea, vomiting, abdominal pain, diarrhea, constipation, blood in stool, abdominal distention and anal bleeding.  Genitourinary: Negative for dysuria and flank pain.  Musculoskeletal: Negative for myalgias, arthralgias and gait problem.  Skin: Negative for color change and rash.  Neurological: Negative for dizziness and headaches.  Hematological: Negative for adenopathy. Does not bruise/bleed easily.  Psychiatric/Behavioral: Negative for suicidal ideas, sleep disturbance and dysphoric mood. The patient is not nervous/anxious.        Objective:   Physical Exam  Constitutional: She is oriented to person, place, and time. She appears well-developed and well-nourished. No distress.  HENT:  Head: Normocephalic and atraumatic.  Right Ear: External ear normal.  Left Ear: External ear normal.  Nose: Nose normal.  Mouth/Throat: Oropharynx is clear and moist. No oropharyngeal exudate.  Eyes: Conjunctivae are normal. Pupils are equal, round, and reactive to light. Right eye exhibits no discharge. Left eye exhibits no discharge. No scleral icterus.  Neck: Normal range of motion. Neck supple. No tracheal deviation present. No thyromegaly present.  Cardiovascular: Normal rate, regular rhythm, normal heart sounds and intact distal pulses.  Exam reveals no gallop and no friction rub.   No  murmur heard. Pulmonary/Chest: Effort normal and breath sounds normal. No respiratory distress. She has no wheezes. She has no rales. She exhibits no tenderness.  Musculoskeletal: Normal range of motion. She exhibits no edema and no tenderness.  Lymphadenopathy:     She has no cervical adenopathy.  Neurological: She is alert and oriented to person, place, and time. No cranial nerve deficit. She exhibits normal muscle tone. Coordination normal.  Skin: Skin is warm and dry. No rash noted. She is not diaphoretic. No erythema. No pallor.  Psychiatric: She has a normal mood and affect. Her behavior is normal. Judgment and thought content normal.          Assessment & Plan:

## 2013-03-10 NOTE — Assessment & Plan Note (Signed)
BP Readings from Last 3 Encounters:  03/10/13 130/80  03/06/13 163/99  12/08/12 130/82   BP improved today on Bystolic. Will plan to continue Bystolic. Follow up with Cardiology this month.

## 2013-03-10 NOTE — Assessment & Plan Note (Addendum)
Pre-syncopal episode seems most consistent with vasovagal reaction. Exam normal today. Will check electrolytes and thyroid function with labs. Will continue bystolic for now. Encouraged pt to keep follow up as scheduled with cardiology for additional evaluatoin. Initial reading on nuclear stress test performed during inpatient stay has some discrepancies, read as normal but notes abnormal perfusion.

## 2013-03-10 NOTE — Telephone Encounter (Signed)
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  Pt to have CPE 5/12.  If labs are needed please forward lab slip to [facility].

## 2013-03-11 ENCOUNTER — Encounter: Payer: Self-pay | Admitting: Internal Medicine

## 2013-03-11 LAB — COMPREHENSIVE METABOLIC PANEL
ALT: 13 IU/L (ref 0–32)
Albumin/Globulin Ratio: 1.8 (ref 1.1–2.5)
Alkaline Phosphatase: 67 IU/L (ref 39–117)
BUN/Creatinine Ratio: 17 (ref 9–23)
GFR calc Af Amer: 111 mL/min/{1.73_m2} (ref >59)
GFR calc non Af Amer: 96 mL/min/{1.73_m2} (ref >59)
Glucose: 81 mg/dL (ref 65–99)
Potassium: 3.7 mmol/L (ref 3.5–5.2)
Total Bilirubin: 0.5 mg/dL (ref 0.0–1.2)
Total Protein: 7.1 g/dL (ref 6.0–8.5)

## 2013-03-11 LAB — CBC WITH DIFFERENTIAL/PLATELET
Basos: 2 % (ref 0–3)
Eos: 3 % (ref 0–5)
HCT: 38.2 % (ref 34.0–46.6)
Lymphocytes Absolute: 1.9 10*3/uL (ref 0.7–3.1)
MCH: 30.5 pg (ref 26.6–33.0)
MCV: 92 fL (ref 79–97)
Monocytes Absolute: 0.6 10*3/uL (ref 0.1–0.9)
RBC: 4.17 x10E6/uL (ref 3.77–5.28)
RDW: 14.5 % (ref 12.3–15.4)
WBC: 6.4 10*3/uL (ref 3.4–10.8)

## 2013-03-11 LAB — LIPID PANEL
Chol/HDL Ratio: 3.7 ratio units (ref 0.0–4.4)
HDL: 48 mg/dL (ref >39)
LDL Calculated: 101 mg/dL — ABNORMAL HIGH (ref 0–99)
Triglycerides: 142 mg/dL (ref 0–149)
VLDL Cholesterol Cal: 28 mg/dL (ref 5–40)

## 2013-03-16 ENCOUNTER — Encounter: Payer: Self-pay | Admitting: *Deleted

## 2013-03-16 NOTE — Telephone Encounter (Signed)
No she doesn't need any additional labs. Full panel 3/28. Thanks!

## 2013-03-16 NOTE — Telephone Encounter (Signed)
Left detailed information on patient voicemail.  

## 2013-03-16 NOTE — Telephone Encounter (Signed)
Dr. Dan Humphreys pt had labs done on 03/10/2013, did you want to order anything different?

## 2013-03-27 ENCOUNTER — Encounter: Payer: Self-pay | Admitting: *Deleted

## 2013-03-28 ENCOUNTER — Encounter: Payer: Self-pay | Admitting: Cardiovascular Disease

## 2013-03-28 ENCOUNTER — Ambulatory Visit (INDEPENDENT_AMBULATORY_CARE_PROVIDER_SITE_OTHER): Payer: 59 | Admitting: Cardiovascular Disease

## 2013-03-28 VITALS — BP 130/100 | HR 61 | Ht 66.0 in | Wt 143.8 lb

## 2013-03-28 DIAGNOSIS — R079 Chest pain, unspecified: Secondary | ICD-10-CM

## 2013-03-28 DIAGNOSIS — F411 Generalized anxiety disorder: Secondary | ICD-10-CM

## 2013-03-28 DIAGNOSIS — I10 Essential (primary) hypertension: Secondary | ICD-10-CM

## 2013-03-28 DIAGNOSIS — R0602 Shortness of breath: Secondary | ICD-10-CM

## 2013-03-28 DIAGNOSIS — F419 Anxiety disorder, unspecified: Secondary | ICD-10-CM

## 2013-03-28 DIAGNOSIS — R55 Syncope and collapse: Secondary | ICD-10-CM

## 2013-03-28 NOTE — Patient Instructions (Addendum)
Your physician has requested that you have a renal artery duplex. During this test, an ultrasound is used to evaluate blood flow to the kidneys. Allow one hour for this exam. Do not eat after midnight the day before and avoid carbonated beverages. Take your medications as you usually do.  Labs to be checked in morning (does not have to be fasting).   Follow up as needed.

## 2013-03-28 NOTE — Assessment & Plan Note (Signed)
I think given her young age, we need to rule out secondary hypertension. Her potassium was low recently also during her hospitalization. Thus, I will check aldosterone/renin ratio, morning cortisol level and renal artery duplex ultrasound to make sure she has no evidence of fibromuscular dysplasia. If she continues to have similar episodes associated with sudden hypertension, he might need to consider 24 hour urine collection to check for pheochromocytoma.

## 2013-03-28 NOTE — Progress Notes (Signed)
HPI  This is a pleasant 42 year old Caucasian female who is here today for evaluation of hypertension and abnormal ECG. She works at [employer]. She has known history of migraine and possible asthma. Recently while she was at work, she had an episode where she felt pressure in her ears and head associated with tunneled vision which was then followed by fast breathing and dizziness without syncope. She was checked by the nurse there and was found to have a blood pressure of greater than 200. She was taken to the emergency room at [facility]. Her cardiac enzymes were negative. Her labs were unremarkable except for a potassium of 3.2 and magnesium of 1.7. She underwent an echocardiogram which was personally reviewed by me today and was basically normal. She had a treadmill nuclear stress test which was also normal with an ejection fraction of 69%. She was placed on carvedilol but her blood pressure dropped and this was discontinued. After stopping this her blood pressure started going up and subsequently Bystolic 5 mg daily was added. All of her hospital records were reviewed. She reports having a similar episode about 4 or 5 years ago which was attributed to anxiety. There is no family history of premature coronary artery disease or hypertension. She had routine labs done  recently which showed normal thyroid function. She denies any chest pain.  Allergies  Allergen Reactions  . Amoxicillin-Pot Clavulanate     Passed out, has taken penicillin and amoxicillin w/no problems     Current Outpatient Prescriptions on File Prior to Visit  Medication Sig Dispense Refill  . albuterol (VENTOLIN HFA) 108 (90 BASE) MCG/ACT inhaler Inhale 2 puffs into the lungs every 6 (six) hours as needed.  18 g  3  . ALPRAZolam (XANAX) 0.25 MG tablet Take 1 tablet (0.25 mg total) by mouth at bedtime as needed. For asthma attack  30 tablet  3  . fexofenadine (ALLEGRA) 180 MG tablet Take 1 tablet (180 mg total) by mouth daily.  30  tablet  6  . fluticasone-salmeterol (ADVAIR HFA) 115-21 MCG/ACT inhaler Inhale 2 puffs into the lungs 2 (two) times daily as needed.  1 Inhaler  3  . nebivolol (BYSTOLIC) 5 MG tablet Take 1 tablet (5 mg total) by mouth daily.  30 tablet  3  . Probiotic Product (ALIGN) 4 MG CAPS Take 4 mg by mouth daily.  30 capsule  3   No current facility-administered medications on file prior to visit.     Past Medical History  Diagnosis Date  . Chronic diarrhea   . Anxiety   . Asthma   . Abnormal stress test     Dr. Oleta Mouse  . Seasonal allergies   . Migraines   . Hypertension      Past Surgical History  Procedure Laterality Date  . Vaginal delivery      3     Family History  Problem Relation Age of Onset  . Hypertension Mother   . Heart disease Father   . Diabetes Father   . Stroke Father   . Breast cancer Maternal Aunt   . Breast cancer Paternal Grandmother   . Heart disease Paternal Grandfather   . Diabetes Other      History   Social History  . Marital Status: Married    Spouse Name: N/A    Number of Children: 3  . Years of Education: N/A   Occupational History  . [employer]   Social History Main Topics  .  Smoking status: Never Smoker   . Smokeless tobacco: Never Used  . Alcohol Use: Yes     Comment: Rarely  . Drug Use: No  . Sexually Active: Not on file   Other Topics Concern  . Not on file   Social History Narrative   Lives in [place]. Works in [employer] at [employer]. 3 children. 2 cats and dog. Diet: regular.    Daily Caffeine Use:  1 sweet tea and 1 soda   Regular Exercise -  NO              ROS Constitutional: Negative for fever, chills, diaphoresis, activity change, appetite change and fatigue.  HENT: Negative for hearing loss, nosebleeds, congestion, sore throat, facial swelling, drooling, trouble swallowing, neck pain, voice change, sinus pressure and tinnitus.  Eyes: Negative for photophobia, pain, discharge and visual  disturbance.  Respiratory: Negative for apnea, cough, chest tightness and wheezing.  Cardiovascular: Negative for chest pain, palpitations and leg swelling.  Gastrointestinal: Negative for nausea, vomiting, abdominal pain, diarrhea, constipation, blood in stool and abdominal distention.  Genitourinary: Negative for dysuria, urgency, frequency, hematuria and decreased urine volume.  Musculoskeletal: Negative for myalgias, back pain, joint swelling, arthralgias and gait problem.  Skin: Negative for color change, pallor, rash and wound.  Neurological: Negative for  tremors, seizures, syncope, speech difficulty, weakness, light-headedness, numbness and headaches.  Psychiatric/Behavioral: Negative for suicidal ideas, hallucinations, behavioral problems and agitation. The patient is not nervous/anxious.     PHYSICAL EXAM   BP 130/100  Pulse 61  Ht 5\' 6"  (1.676 m)  Wt 143 lb 12 oz (65.205 kg)  BMI 23.21 kg/m2  LMP 02/14/2013 Constitutional: She is oriented to person, place, and time. She appears well-developed and well-nourished. No distress.  HENT: No nasal discharge.  Head: Normocephalic and atraumatic.  Eyes: Pupils are equal and round. Right eye exhibits no discharge. Left eye exhibits no discharge.  Neck: Normal range of motion. Neck supple. No JVD present. No thyromegaly present.  Cardiovascular: Normal rate, regular rhythm, normal heart sounds. Exam reveals no gallop and no friction rub. No murmur heard.  Pulmonary/Chest: Effort normal and breath sounds normal. No stridor. No respiratory distress. She has no wheezes. She has no rales. She exhibits no tenderness.  Abdominal: Soft. Bowel sounds are normal. She exhibits no distension. There is no tenderness. There is no rebound and no guarding.  Musculoskeletal: Normal range of motion. She exhibits no edema and no tenderness.  Neurological: She is alert and oriented to person, place, and time. Coordination normal.  Skin: Skin is warm and  dry. No rash noted. She is not diaphoretic. No erythema. No pallor.  Psychiatric: She has a normal mood and affect. Her behavior is normal. Judgment and thought content normal.     EKG: Sinus  Rhythm  -RSR(V1) -nondiagnostic.   -Nonspecific ST depression  -Nondiagnostic.   ABNORMAL     ASSESSMENT AND PLAN

## 2013-03-28 NOTE — Assessment & Plan Note (Signed)
It appears that she had dizziness in the setting of hyperventilating which could have been related to anxiety. Her cardiac workup including echocardiogram and nuclear stress test were both unremarkable. She has nonspecific changes on her EKG which was more pronounced in the setting of elevated blood pressure. She has really no significant risk factors for coronary artery disease.

## 2013-03-28 NOTE — Assessment & Plan Note (Signed)
It is possible that the recent episode was related to anxiety/panic attack. However, this is a diagnosis of exclusion.

## 2013-03-29 ENCOUNTER — Other Ambulatory Visit: Payer: Self-pay | Admitting: Cardiovascular Disease

## 2013-03-30 LAB — CORTISOL: Cortisol: 11 ug/dL (ref 2.3–19.4)

## 2013-04-01 LAB — ALDOSTERONE/RENIN RATIO
ALDOS/RENIN RATIO: 8.2 (ref 0.0–30.0)
ALDOSTERONE: 4.2 ng/dL (ref 0.0–30.0)

## 2013-04-02 DIAGNOSIS — Z09 Encounter for follow-up examination after completed treatment for conditions other than malignant neoplasm: Secondary | ICD-10-CM

## 2013-04-02 DIAGNOSIS — R9439 Abnormal result of other cardiovascular function study: Secondary | ICD-10-CM

## 2013-04-02 DIAGNOSIS — I10 Essential (primary) hypertension: Secondary | ICD-10-CM

## 2013-04-02 DIAGNOSIS — J45909 Unspecified asthma, uncomplicated: Secondary | ICD-10-CM

## 2013-04-02 DIAGNOSIS — F411 Generalized anxiety disorder: Secondary | ICD-10-CM

## 2013-04-02 DIAGNOSIS — J309 Allergic rhinitis, unspecified: Secondary | ICD-10-CM

## 2013-04-03 ENCOUNTER — Encounter: Payer: Self-pay | Admitting: *Deleted

## 2013-04-03 NOTE — Progress Notes (Signed)
lmtcb

## 2013-04-03 NOTE — Progress Notes (Signed)
Attempted to call x2. Lab result released to my chart.

## 2013-04-24 ENCOUNTER — Ambulatory Visit (INDEPENDENT_AMBULATORY_CARE_PROVIDER_SITE_OTHER): Payer: 59 | Admitting: Internal Medicine

## 2013-04-24 ENCOUNTER — Encounter: Payer: Self-pay | Admitting: Internal Medicine

## 2013-04-24 VITALS — BP 98/70 | HR 59 | Temp 98.2°F | Ht 66.5 in | Wt 147.0 lb

## 2013-04-24 DIAGNOSIS — Z0000 Encounter for general adult medical examination without abnormal findings: Secondary | ICD-10-CM

## 2013-04-24 DIAGNOSIS — I10 Essential (primary) hypertension: Secondary | ICD-10-CM

## 2013-04-24 MED ORDER — NEBIVOLOL HCL 5 MG PO TABS: 2.5000 mg | ORAL_TABLET | Freq: Every day | ORAL | Status: DC

## 2013-04-24 NOTE — Progress Notes (Signed)
Subjective:    Patient ID: Briana Klein, female    DOB: [birth year], 42 y.o.   MRN: [id number]  HPI 42 year old female presents for annual exam. She reports she is generally doing well. She denies any repeated episodes of palpitations after recent episode during which she was evaluated in the emergency room for palpitations and chest pain. Notably, exercise stress test was normal. She is scheduled to undergo renal artery Doppler later this week. She reports she is feeling well. She denies exercise intolerance, shortness of breath. She is trying to follow a healthy diet and get regular physical activity.  In regards to health maintenance, she underwent Pap smear in 2012 which was normal HPV negative. She has not had a mammogram in over 7 years. She is up-to-date on immunizations.  Outpatient Encounter Prescriptions as of 04/24/2013  Medication Sig Dispense Refill  . albuterol (VENTOLIN HFA) 108 (90 BASE) MCG/ACT inhaler Inhale 2 puffs into the lungs every 6 (six) hours as needed.  18 g  3  . ALPRAZolam (XANAX) 0.25 MG tablet Take 1 tablet (0.25 mg total) by mouth at bedtime as needed. For asthma attack  30 tablet  3  . fexofenadine (ALLEGRA) 180 MG tablet Take 1 tablet (180 mg total) by mouth daily.  30 tablet  6  . fluticasone-salmeterol (ADVAIR HFA) 115-21 MCG/ACT inhaler Inhale 2 puffs into the lungs 2 (two) times daily as needed.  1 Inhaler  3  . nebivolol (BYSTOLIC) 5 MG tablet Take 0.5 tablets (2.5 mg total) by mouth daily.  30 tablet  3  . POTASSIUM PO Take by mouth daily.      . [DISCONTINUED] nebivolol (BYSTOLIC) 5 MG tablet Take 1 tablet (5 mg total) by mouth daily.  30 tablet  3  . Multiple Vitamin (MULTIVITAMIN) tablet Take 1 tablet by mouth daily.      . Probiotic Product (ALIGN) 4 MG CAPS Take 4 mg by mouth daily.  30 capsule  3   No facility-administered encounter medications on file as of 04/24/2013.   BP 98/70  Pulse 59  Temp(Src) 98.2 F (36.8 C) (Oral)  Ht 5' 6.5" (1.689 m)   Wt 147 lb (66.679 kg)  BMI 23.37 kg/m2  SpO2 98%  LMP 04/05/2013  Review of Systems  Constitutional: Negative for fever, chills, appetite change, fatigue and unexpected weight change.  HENT: Negative for ear pain, congestion, sore throat, trouble swallowing, neck pain, voice change and sinus pressure.   Eyes: Negative for visual disturbance.  Respiratory: Negative for cough, shortness of breath, wheezing and stridor.   Cardiovascular: Negative for chest pain, palpitations and leg swelling.  Gastrointestinal: Negative for nausea, vomiting, abdominal pain, diarrhea, constipation, blood in stool, abdominal distention and anal bleeding.  Genitourinary: Negative for dysuria and flank pain.  Musculoskeletal: Negative for myalgias, arthralgias and gait problem.  Skin: Negative for color change and rash.  Neurological: Negative for dizziness and headaches.  Hematological: Negative for adenopathy. Does not bruise/bleed easily.  Psychiatric/Behavioral: Negative for suicidal ideas, sleep disturbance and dysphoric mood. The patient is not nervous/anxious.        Objective:   Physical Exam  Constitutional: She is oriented to person, place, and time. She appears well-developed and well-nourished. No distress.  HENT:  Head: Normocephalic and atraumatic.  Right Ear: External ear normal.  Left Ear: External ear normal.  Nose: Nose normal.  Mouth/Throat: Oropharynx is clear and moist. No oropharyngeal exudate.  Eyes: Conjunctivae are normal. Pupils are equal, round, and reactive to light. Right eye  exhibits no discharge. Left eye exhibits no discharge. No scleral icterus.  Neck: Normal range of motion. Neck supple. No tracheal deviation present. No thyromegaly present.  Cardiovascular: Normal rate, regular rhythm, normal heart sounds and intact distal pulses.  Exam reveals no gallop and no friction rub.   No murmur heard. Pulmonary/Chest: Effort normal and breath sounds normal. No accessory muscle  usage. Not tachypneic. No respiratory distress. She has no decreased breath sounds. She has no wheezes. She has no rales. She exhibits no tenderness. Right breast exhibits no inverted nipple, no mass, no nipple discharge, no skin change and no tenderness. Left breast exhibits no inverted nipple, no mass, no nipple discharge, no skin change and no tenderness. Breasts are symmetrical.  Abdominal: Soft. Bowel sounds are normal. She exhibits no distension and no mass. There is no tenderness. There is no rebound and no guarding.  Musculoskeletal: Normal range of motion. She exhibits no edema and no tenderness.  Lymphadenopathy:    She has no cervical adenopathy.  Neurological: She is alert and oriented to person, place, and time. No cranial nerve deficit. She exhibits normal muscle tone. Coordination normal.  Skin: Skin is warm and dry. No rash noted. She is not diaphoretic. No erythema. No pallor.  Psychiatric: She has a normal mood and affect. Her behavior is normal. Judgment and thought content normal.          Assessment & Plan:

## 2013-04-24 NOTE — Assessment & Plan Note (Signed)
General medical exam including breast exam normal today. Pap is up-to-date and was normal, negative HPV 2012. Plan to repeat Pap in 2017. Will schedule mammogram. Recommended yearly mammograms. Reviewed recent labs including lipid profile which was normal. Encouraged continued efforts at healthy diet and regular physical activity. Followup in 6 months or sooner as needed.

## 2013-04-24 NOTE — Assessment & Plan Note (Signed)
BP running low on Bystolic 5mg . Will taper to 2.5mg  daily. Pt will monitor BP and call if BP consistently <140/90, will plan to stop Bystolic. Evaluation to date for secondary cause of hypertension has been negative. Renal artery doppler pending for next week.

## 2013-04-26 ENCOUNTER — Encounter (INDEPENDENT_AMBULATORY_CARE_PROVIDER_SITE_OTHER): Payer: 59

## 2013-04-26 DIAGNOSIS — I10 Essential (primary) hypertension: Secondary | ICD-10-CM

## 2013-05-29 ENCOUNTER — Telehealth: Payer: Self-pay

## 2013-05-29 NOTE — Telephone Encounter (Signed)
lmtcb re:CT abd/pelvis [facility] does not have record of pt having this done

## 2013-06-02 NOTE — Telephone Encounter (Signed)
msg left that mellisa rn  Will return Monday and will return call.

## 2013-06-02 NOTE — Telephone Encounter (Signed)
Pt is returning call to [facility] to R/s her CT scan. PLease call

## 2013-06-05 ENCOUNTER — Encounter: Payer: Self-pay | Admitting: Emergency Medicine

## 2013-06-05 NOTE — Telephone Encounter (Signed)
lmtcb

## 2013-06-21 ENCOUNTER — Telehealth: Payer: Self-pay | Admitting: Internal Medicine

## 2013-06-21 NOTE — Telephone Encounter (Signed)
Mammogram from [facility] from 06/19/2013 requested additional compression views. Has this been ordered/performed?

## 2013-06-22 NOTE — Telephone Encounter (Signed)
Spoke with patient she is not sure if anything has been ordered, she received a message from [facility]. She will call them back then let us know.

## 2013-06-22 NOTE — Telephone Encounter (Signed)
LMTCB

## 2013-06-27 NOTE — Telephone Encounter (Signed)
This was already scheduled by [facility]

## 2013-06-30 ENCOUNTER — Encounter: Payer: Self-pay | Admitting: Internal Medicine

## 2013-06-30 ENCOUNTER — Other Ambulatory Visit: Payer: Self-pay | Admitting: Internal Medicine

## 2013-06-30 NOTE — Telephone Encounter (Signed)
Refilled xanax #[phone] with no refills.   

## 2013-07-14 ENCOUNTER — Telehealth: Payer: Self-pay | Admitting: *Deleted

## 2013-07-14 NOTE — Telephone Encounter (Signed)
We would need to bring her in to check blood type.

## 2013-07-14 NOTE — Telephone Encounter (Signed)
Patient left a voicemail asking how she could get documentation of her blood type. She thinks she is AB but need documentation of that.

## 2013-07-14 NOTE — Telephone Encounter (Signed)
Left message to call back  

## 2013-07-17 NOTE — Telephone Encounter (Signed)
Left detailed message on patient voicemail, she need to call office to schedule an appointment.

## 2013-07-25 NOTE — Telephone Encounter (Signed)
Patient called back today and left a message stating she does not want to come in for blood typing, thought we may have had a copy of it on file. Just looking for a print out to give to her job, thought maybe she had her children they faxed that information over. But since we do not have it then no need for an appointment just checking up on it.

## 2013-08-18 ENCOUNTER — Other Ambulatory Visit: Payer: Self-pay | Admitting: *Deleted

## 2013-08-18 DIAGNOSIS — I10 Essential (primary) hypertension: Secondary | ICD-10-CM

## 2013-08-18 MED ORDER — ALPRAZOLAM 0.25 MG PO TABS: ORAL_TABLET | ORAL | Status: DC

## 2013-08-18 MED ORDER — NEBIVOLOL HCL 5 MG PO TABS: 2.5000 mg | ORAL_TABLET | Freq: Every day | ORAL | Status: DC

## 2013-08-18 NOTE — Telephone Encounter (Signed)
Left message for patient to call me back, she left message on voicemail stating she was using a new pharmacy that would allow her to get her medication through mail order.

## 2013-09-01 ENCOUNTER — Ambulatory Visit (INDEPENDENT_AMBULATORY_CARE_PROVIDER_SITE_OTHER): Payer: 59 | Admitting: Internal Medicine

## 2013-09-01 ENCOUNTER — Encounter: Payer: Self-pay | Admitting: Internal Medicine

## 2013-09-01 VITALS — BP 130/92 | HR 58 | Temp 98.1°F | Wt 147.0 lb

## 2013-09-01 DIAGNOSIS — F419 Anxiety disorder, unspecified: Secondary | ICD-10-CM

## 2013-09-01 DIAGNOSIS — N920 Excessive and frequent menstruation with regular cycle: Secondary | ICD-10-CM | POA: Insufficient documentation

## 2013-09-01 DIAGNOSIS — F411 Generalized anxiety disorder: Secondary | ICD-10-CM

## 2013-09-01 DIAGNOSIS — I10 Essential (primary) hypertension: Secondary | ICD-10-CM

## 2013-09-01 MED ORDER — ALPRAZOLAM 0.25 MG PO TABS: 0.2500 mg | ORAL_TABLET | Freq: Every evening | ORAL | Status: DC | PRN

## 2013-09-01 MED ORDER — FLUOXETINE HCL 20 MG PO TABS: 20.0000 mg | ORAL_TABLET | Freq: Every day | ORAL | Status: DC

## 2013-09-01 MED ORDER — NEBIVOLOL HCL 5 MG PO TABS: 2.5000 mg | ORAL_TABLET | Freq: Every day | ORAL | Status: DC

## 2013-09-01 NOTE — Assessment & Plan Note (Signed)
Significant menorrhagia. Will check CBC and ferritin today. Will set up GYN evaluation. Question if she might be a good candidate for NovaSure.

## 2013-09-01 NOTE — Assessment & Plan Note (Signed)
Blood pressure intermittently elevated, likely related to increased anxiety and stress particularly at work. Adding fluoxetine today to better control anxiety symptoms. We'll continue Bystolic 2.5 mg daily. Followup in 4 weeks.

## 2013-09-01 NOTE — Assessment & Plan Note (Signed)
Persistent symptoms of anxiety. Will start fluoxetine 20 mg daily. Continue alprazolam as needed. Followup in 4 weeks or sooner as needed.

## 2013-09-01 NOTE — Progress Notes (Signed)
Subjective:    Patient ID: Briana Klein, female    DOB: 04/14/1971, 42 y.o.   MRN: [id number]  HPI 42 year old female with history of anxiety and hypertension presents for acute visit. She reports recent symptoms of increased anxiety related to stress at work. She reports that she generally feels well at home and blood pressure is well-controlled typically less than 130/80. However, at work, she feels increased stress often having sensation of pressure or pain behind her ear is described as headache. She has checked her blood pressure on occasion at work and it is greater than 150/100. She questions whether her medication needs to be changed or increased. She denies chest pain or palpitations.   She is also concerned about recent extremely heavy menses. She reports that her menstrual flow is so heavy that she is unable to contain the flow with use of the menstrual pad. Recently she had to leave work because blood was soaking through her clothes. She is unable to use a tampon because the flow is so heavy. She feels exhausted during her periods.  Outpatient Encounter Prescriptions as of 09/01/2013  Medication Sig Dispense Refill  . albuterol (VENTOLIN HFA) 108 (90 BASE) MCG/ACT inhaler Inhale 2 puffs into the lungs every 6 (six) hours as needed.  18 g  3  . ALPRAZolam (XANAX) 0.25 MG tablet Take 1 tablet (0.25 mg total) by mouth at bedtime as needed for sleep or anxiety.  90 tablet  3  . fexofenadine (ALLEGRA) 180 MG tablet Take 1 tablet (180 mg total) by mouth daily.  30 tablet  6  . fluticasone-salmeterol (ADVAIR HFA) 115-21 MCG/ACT inhaler Inhale 2 puffs into the lungs 2 (two) times daily as needed.  1 Inhaler  3  . Multiple Vitamin (MULTIVITAMIN) tablet Take 1 tablet by mouth daily.      . nebivolol (BYSTOLIC) 5 MG tablet Take 0.5 tablets (2.5 mg total) by mouth daily.  90 tablet  3   No facility-administered encounter medications on file as of 09/01/2013.   BP 130/92  Pulse 58  Temp(Src) 98.1  F (36.7 C) (Oral)  Wt 147 lb (66.679 kg)  BMI 23.37 kg/m2  SpO2 98%  Review of Systems  Constitutional: Positive for fatigue. Negative for fever, chills, appetite change and unexpected weight change.  HENT: Negative for ear pain, congestion, sore throat, trouble swallowing, neck pain, voice change and sinus pressure.   Eyes: Negative for visual disturbance.  Respiratory: Negative for cough, shortness of breath, wheezing and stridor.   Cardiovascular: Negative for chest pain, palpitations and leg swelling.  Gastrointestinal: Negative for nausea, vomiting, abdominal pain, diarrhea, constipation, blood in stool, abdominal distention and anal bleeding.  Genitourinary: Negative for dysuria and flank pain.  Musculoskeletal: Negative for myalgias, arthralgias and gait problem.  Skin: Negative for color change and rash.  Neurological: Positive for headaches. Negative for dizziness.  Hematological: Negative for adenopathy. Does not bruise/bleed easily.  Psychiatric/Behavioral: Positive for sleep disturbance. Negative for suicidal ideas and dysphoric mood. The patient is nervous/anxious.        Objective:   Physical Exam  Constitutional: She is oriented to person, place, and time. She appears well-developed and well-nourished. No distress.  HENT:  Head: Normocephalic and atraumatic.  Right Ear: External ear normal.  Left Ear: External ear normal.  Nose: Nose normal.  Mouth/Throat: Oropharynx is clear and moist. No oropharyngeal exudate.  Eyes: Conjunctivae are normal. Pupils are equal, round, and reactive to light. Right eye exhibits no discharge. Left eye exhibits no  discharge. No scleral icterus.  Neck: Normal range of motion. Neck supple. No tracheal deviation present. No thyromegaly present.  Cardiovascular: Normal rate, regular rhythm, normal heart sounds and intact distal pulses.  Exam reveals no gallop and no friction rub.   No murmur heard. Pulmonary/Chest: Effort normal and breath  sounds normal. No accessory muscle usage. Not tachypneic. No respiratory distress. She has no decreased breath sounds. She has no wheezes. She has no rhonchi. She has no rales. She exhibits no tenderness.  Musculoskeletal: Normal range of motion. She exhibits no edema and no tenderness.  Lymphadenopathy:    She has no cervical adenopathy.  Neurological: She is alert and oriented to person, place, and time. No cranial nerve deficit. She exhibits normal muscle tone. Coordination normal.  Skin: Skin is warm and dry. No rash noted. She is not diaphoretic. No erythema. No pallor.  Psychiatric: Her speech is normal and behavior is normal. Judgment and thought content normal. Her mood appears anxious. Cognition and memory are normal.          Assessment & Plan:

## 2013-09-02 LAB — CBC WITH DIFFERENTIAL/PLATELET
Basophils Absolute: 0.1 10*3/uL (ref 0.0–0.2)
Eosinophils Absolute: 0.2 10*3/uL (ref 0.0–0.4)
Immature Grans (Abs): 0 10*3/uL (ref 0.0–0.1)
Lymphs: 31 %
MCH: 31.3 pg (ref 26.6–33.0)
MCHC: 34.7 g/dL (ref 31.5–35.7)
Monocytes Absolute: 0.5 10*3/uL (ref 0.1–0.9)
Neutrophils Relative %: 56 %

## 2013-09-02 LAB — FERRITIN: Ferritin: 40 ng/mL (ref 15–150)

## 2013-09-21 ENCOUNTER — Other Ambulatory Visit: Payer: Self-pay | Admitting: *Deleted

## 2013-09-21 DIAGNOSIS — I10 Essential (primary) hypertension: Secondary | ICD-10-CM

## 2013-09-21 DIAGNOSIS — F419 Anxiety disorder, unspecified: Secondary | ICD-10-CM

## 2013-09-21 MED ORDER — FLUOXETINE HCL 20 MG PO TABS: 20.0000 mg | ORAL_TABLET | Freq: Every day | ORAL | Status: DC

## 2013-09-21 MED ORDER — NEBIVOLOL HCL 5 MG PO TABS: 2.5000 mg | ORAL_TABLET | Freq: Every day | ORAL | Status: DC

## 2013-09-21 NOTE — Telephone Encounter (Signed)
Prescription sent to the pharmacy.

## 2013-09-28 ENCOUNTER — Encounter: Payer: Self-pay | Admitting: *Deleted

## 2013-09-29 ENCOUNTER — Encounter: Payer: Self-pay | Admitting: Internal Medicine

## 2013-09-29 ENCOUNTER — Ambulatory Visit (INDEPENDENT_AMBULATORY_CARE_PROVIDER_SITE_OTHER): Payer: 59 | Admitting: Internal Medicine

## 2013-09-29 VITALS — BP 134/80 | HR 60 | Temp 98.0°F | Wt 145.0 lb

## 2013-09-29 DIAGNOSIS — N920 Excessive and frequent menstruation with regular cycle: Secondary | ICD-10-CM

## 2013-09-29 DIAGNOSIS — F411 Generalized anxiety disorder: Secondary | ICD-10-CM

## 2013-09-29 DIAGNOSIS — F419 Anxiety disorder, unspecified: Secondary | ICD-10-CM

## 2013-09-29 DIAGNOSIS — I10 Essential (primary) hypertension: Secondary | ICD-10-CM

## 2013-09-29 MED ORDER — BUPROPION HCL ER (XL) 150 MG PO TB24: 150.0000 mg | ORAL_TABLET | Freq: Every day | ORAL | Status: DC

## 2013-09-29 NOTE — Assessment & Plan Note (Signed)
Pt recently seen by GYN and scheduled for endometrial biopsy, pelvic US, with plans for possible NovaSur. Will Follow.

## 2013-09-29 NOTE — Progress Notes (Signed)
Subjective:    Patient ID: Briana Klein, female    DOB: 1971-08-03, 42 y.o.   MRN: [id number]  HPI 42 year old female with history of hypertension and anxiety presents for followup. At her last visit, she complained of worsening anxiety. She was started on fluoxetine. She reports significant improvement in anxiety however is concerned about side effects from the medication including tremor in her hands and decreased sexual function. She would like to try alternative medications.  In regards to blood pressure, she reports blood pressure has generally been near 130/80. She denies any headache, chest pain, palpitations. She is compliant with medication.  In regards to menorrhagia, she was recently seen by GYN. She has scheduled endometrial biopsy and has discussed possible NovaSure in the future.  Outpatient Prescriptions Prior to Visit  Medication Sig Dispense Refill  . ALPRAZolam (XANAX) 0.25 MG tablet Take 1 tablet (0.25 mg total) by mouth at bedtime as needed for sleep or anxiety.  90 tablet  3  . Multiple Vitamin (MULTIVITAMIN) tablet Take 1 tablet by mouth daily.      . nebivolol (BYSTOLIC) 5 MG tablet Take 0.5 tablets (2.5 mg total) by mouth daily.  90 tablet  3  . FLUoxetine (PROZAC) 20 MG tablet Take 1 tablet (20 mg total) by mouth daily.  90 tablet  2  . albuterol (VENTOLIN HFA) 108 (90 BASE) MCG/ACT inhaler Inhale 2 puffs into the lungs every 6 (six) hours as needed.  18 g  3  . fexofenadine (ALLEGRA) 180 MG tablet Take 1 tablet (180 mg total) by mouth daily.  30 tablet  6  . fluticasone-salmeterol (ADVAIR HFA) 115-21 MCG/ACT inhaler Inhale 2 puffs into the lungs 2 (two) times daily as needed.  1 Inhaler  3  . POTASSIUM PO Take by mouth daily.       No facility-administered medications prior to visit.   BP 134/80  Pulse 60  Temp(Src) 98 F (36.7 C) (Oral)  Wt 145 lb (65.772 kg)  BMI 23.06 kg/m2  SpO2 98%  LMP 09/11/2013  Review of Systems  Constitutional: Negative for  fever, chills, appetite change, fatigue and unexpected weight change.  HENT: Negative for congestion, ear pain, sinus pressure, sore throat, trouble swallowing and voice change.   Eyes: Negative for visual disturbance.  Respiratory: Negative for cough, shortness of breath, wheezing and stridor.   Cardiovascular: Negative for chest pain, palpitations and leg swelling.  Gastrointestinal: Negative for nausea, vomiting, abdominal pain, diarrhea, constipation, blood in stool, abdominal distention and anal bleeding.  Genitourinary: Negative for dysuria and flank pain.  Musculoskeletal: Negative for arthralgias, gait problem, myalgias and neck pain.  Skin: Negative for color change and rash.  Neurological: Positive for tremors. Negative for dizziness and headaches.  Hematological: Negative for adenopathy. Does not bruise/bleed easily.  Psychiatric/Behavioral: Negative for suicidal ideas, sleep disturbance and dysphoric mood. The patient is nervous/anxious.        Objective:   Physical Exam  Constitutional: She is oriented to person, place, and time. She appears well-developed and well-nourished. No distress.  HENT:  Head: Normocephalic and atraumatic.  Right Ear: External ear normal.  Left Ear: External ear normal.  Nose: Nose normal.  Mouth/Throat: Oropharynx is clear and moist. No oropharyngeal exudate.  Eyes: Conjunctivae are normal. Pupils are equal, round, and reactive to light. Right eye exhibits no discharge. Left eye exhibits no discharge. No scleral icterus.  Neck: Normal range of motion. Neck supple. No tracheal deviation present. No thyromegaly present.  Cardiovascular: Normal rate, regular rhythm,  normal heart sounds and intact distal pulses.  Exam reveals no gallop and no friction rub.   No murmur heard. Pulmonary/Chest: Effort normal and breath sounds normal. No accessory muscle usage. Not tachypneic. No respiratory distress. She has no decreased breath sounds. She has no wheezes.  She has no rhonchi. She has no rales. She exhibits no tenderness.  Musculoskeletal: Normal range of motion. She exhibits no edema and no tenderness.  Lymphadenopathy:    She has no cervical adenopathy.  Neurological: She is alert and oriented to person, place, and time. No cranial nerve deficit. She exhibits normal muscle tone. Coordination normal.  Skin: Skin is warm and dry. No rash noted. She is not diaphoretic. No erythema. No pallor.  Psychiatric: Her behavior is normal. Judgment and thought content normal. Her mood appears anxious.          Assessment & Plan:

## 2013-09-29 NOTE — Assessment & Plan Note (Signed)
BP Readings from Last 3 Encounters:  09/29/13 134/80  09/01/13 130/92  04/24/13 98/70   BP generally well controlled on Bystolic 2.5mg  daily. Will continue to monitor.

## 2013-09-29 NOTE — Assessment & Plan Note (Signed)
Symptoms of anxiety improved with Fluoxetine, however having side effects of tremor and sexual dysfunction. Will try change to Wellbutrin. Pt will email with update next week. Follow up 4 weeks and prn.

## 2013-10-24 ENCOUNTER — Ambulatory Visit: Payer: 59 | Admitting: Internal Medicine

## 2013-10-26 ENCOUNTER — Ambulatory Visit: Payer: 59 | Admitting: Internal Medicine

## 2013-10-27 ENCOUNTER — Ambulatory Visit: Payer: 59 | Admitting: Internal Medicine

## 2013-11-17 ENCOUNTER — Ambulatory Visit: Payer: 59 | Admitting: Internal Medicine

## 2013-11-20 ENCOUNTER — Encounter: Payer: Self-pay | Admitting: *Deleted

## 2013-11-21 ENCOUNTER — Ambulatory Visit (INDEPENDENT_AMBULATORY_CARE_PROVIDER_SITE_OTHER): Payer: 59 | Admitting: Internal Medicine

## 2013-11-21 ENCOUNTER — Encounter: Payer: Self-pay | Admitting: Internal Medicine

## 2013-11-21 VITALS — BP 120/80 | HR 68 | Temp 97.8°F | Wt 146.0 lb

## 2013-11-21 DIAGNOSIS — F411 Generalized anxiety disorder: Secondary | ICD-10-CM

## 2013-11-21 DIAGNOSIS — F419 Anxiety disorder, unspecified: Secondary | ICD-10-CM

## 2013-11-21 MED ORDER — BUSPIRONE HCL 5 MG PO TABS: 5.0000 mg | ORAL_TABLET | Freq: Three times a day (TID) | ORAL | Status: DC | PRN

## 2013-11-21 NOTE — Progress Notes (Signed)
   Subjective:    Patient ID: Briana Klein, female    DOB: 12-Jan-1971, 42 y.o.   MRN: [id number]  HPI 41YO female with h/o anxiety presents for follow up. At her last visit, she was started on Wellbutrin to help with anxiety. She notes minimal improvement with this. On the medication, she had problems with sexual function. She stopped the medication and symptoms resolved, however her anxiety has increased. She reports feeling very tense and anxious. She is having trouble sleeping. She notes increased stress at work and at home.  Outpatient Encounter Prescriptions as of 11/21/2013  Medication Sig  . ALPRAZolam (XANAX) 0.25 MG tablet Take 1 tablet (0.25 mg total) by mouth at bedtime as needed for sleep or anxiety.  . nebivolol (BYSTOLIC) 5 MG tablet Take 0.5 tablets (2.5 mg total) by mouth daily.  . fexofenadine (ALLEGRA) 180 MG tablet Take 1 tablet (180 mg total) by mouth daily.  Marland Kitchen POTASSIUM PO Take by mouth daily.   BP 120/80  Pulse 68  Temp(Src) 97.8 F (36.6 C) (Oral)  Wt 146 lb (66.225 kg)  SpO2 98%  Review of Systems  Constitutional: Negative for fever, chills, appetite change, fatigue and unexpected weight change.  HENT: Negative for congestion, ear pain, sinus pressure, sore throat, trouble swallowing and voice change.   Eyes: Negative for visual disturbance.  Respiratory: Negative for cough, shortness of breath, wheezing and stridor.   Cardiovascular: Negative for chest pain, palpitations and leg swelling.  Gastrointestinal: Negative for nausea, vomiting, abdominal pain, diarrhea, constipation, blood in stool, abdominal distention and anal bleeding.  Genitourinary: Negative for dysuria and flank pain.  Musculoskeletal: Negative for arthralgias, gait problem, myalgias and neck pain.  Skin: Negative for color change and rash.  Neurological: Negative for dizziness and headaches.  Hematological: Negative for adenopathy. Does not bruise/bleed easily.  Psychiatric/Behavioral:  Negative for suicidal ideas, sleep disturbance and dysphoric mood. The patient is nervous/anxious.        Objective:   Physical Exam  Constitutional: She is oriented to person, place, and time. She appears well-developed and well-nourished. No distress.  HENT:  Head: Normocephalic and atraumatic.  Right Ear: External ear normal.  Left Ear: External ear normal.  Nose: Nose normal.  Mouth/Throat: Oropharynx is clear and moist. No oropharyngeal exudate.  Eyes: Conjunctivae are normal. Pupils are equal, round, and reactive to light. Right eye exhibits no discharge. Left eye exhibits no discharge. No scleral icterus.  Neck: Normal range of motion. Neck supple. No tracheal deviation present. No thyromegaly present.  Cardiovascular: Normal rate, regular rhythm, normal heart sounds and intact distal pulses.  Exam reveals no gallop and no friction rub.   No murmur heard. Pulmonary/Chest: Effort normal and breath sounds normal. No accessory muscle usage. Not tachypneic. No respiratory distress. She has no decreased breath sounds. She has no wheezes. She has no rhonchi. She has no rales. She exhibits no tenderness.  Musculoskeletal: Normal range of motion. She exhibits no edema and no tenderness.  Lymphadenopathy:    She has no cervical adenopathy.  Neurological: She is alert and oriented to person, place, and time. No cranial nerve deficit. She exhibits normal muscle tone. Coordination normal.  Skin: Skin is warm and dry. No rash noted. She is not diaphoretic. No erythema. No pallor.  Psychiatric: Her behavior is normal. Judgment and thought content normal. Her mood appears anxious.          Assessment & Plan:

## 2013-11-21 NOTE — Assessment & Plan Note (Addendum)
Recent increase in symptoms of anxiety after stopping Wellbutrin. Had problems with sexual function on both Fluoxetine and Wellbutrin. We discussed several options to help control her anxiety including use of another SSRI such as Lexapro, or use of alternative medication such as Buspar. Will try prn Buspar. Pt will email with update later this week. Follow up 4 weeks and prn. Over of which >50% spent in face-to-face contact with patient discussing plan of care

## 2013-11-21 NOTE — Progress Notes (Signed)
Pre-visit discussion using our clinic review tool. No additional management support is needed unless otherwise documented below in the visit note.  

## 2013-11-30 ENCOUNTER — Encounter (HOSPITAL_COMMUNITY): Payer: Self-pay

## 2013-12-04 ENCOUNTER — Encounter (HOSPITAL_COMMUNITY): Payer: Self-pay

## 2013-12-04 ENCOUNTER — Encounter (HOSPITAL_COMMUNITY)
Admission: RE | Admit: 2013-12-04 | Discharge: 2013-12-04 | Disposition: A | Discharge status: Home / Self Care | Payer: 59 | Source: Ambulatory Visit | Attending: Obstetrics and Gynecology | Admitting: Obstetrics and Gynecology

## 2013-12-04 DIAGNOSIS — Z01812 Encounter for preprocedural laboratory examination: Secondary | ICD-10-CM | POA: Insufficient documentation

## 2013-12-04 LAB — BASIC METABOLIC PANEL
BUN: 16 mg/dL (ref 6–23)
Chloride: 101 mEq/L (ref 96–112)
Creatinine, Ser: 0.79 mg/dL (ref 0.50–1.10)
GFR calc non Af Amer: >90 mL/min (ref >90)
Glucose, Bld: 86 mg/dL (ref 70–99)
Potassium: 3.4 mEq/L — ABNORMAL LOW (ref 3.5–5.1)

## 2013-12-04 LAB — CBC
HCT: 36.7 % (ref 36.0–46.0)
Hemoglobin: 13.1 g/dL (ref 12.0–15.0)
MCHC: 35.7 g/dL (ref 30.0–36.0)
MCV: 87.6 fL (ref 78.0–100.0)
WBC: 5.7 10*3/uL (ref 4.0–10.5)

## 2013-12-04 NOTE — Patient Instructions (Signed)
   Your procedure is scheduled on: Tuesday, Dec 30  Enter through the [employer] of [facility] at:  11 AM Pick up the phone at the desk and dial [phone] and inform us of your arrival.  Please call this number if you have any problems the morning of surgery: [phone]  Remember: Do not eat food after midnight:  Monday Do not drink clear liquids after: 830 am Tuesday, day of surgery Take these medicines the morning of surgery with a SIP OF WATER: xanax and buspar  Do not wear jewelry, make-up, or FINGER nail polish No metal in your hair or on your body. Do not wear lotions, powders, perfumes. You may wear deodorant.  Please use your CHG wash as directed prior to surgery.  Do not shave anywhere for at least 12 hours prior to first CHG shower.  Do not bring valuables to the hospital. Contacts, dentures or bridgework may not be worn into surgery.  Patients discharged on the day of surgery will not be allowed to drive home.   Home with husband jason  cell [phone].

## 2013-12-10 NOTE — H&P (Signed)
Briana Klein is an 42 y.o. female G3P3 who presents for an ablation procedure for an ongoing problem with menorrhagia.  The patient has cyclic periods every 28 days but they last 7-10 days and are very heavy--flooding through clothes and causing her to miss work.  She is not a candidate for OCP's because they elevate her BP.  AN US performed 11/14 showed a normal cavity with no lesions seen.  Her husband is s/p vasectomy and she has no future desire for childbearing.  Pertinent Gynecological History: OB History:  NSVD x 3   Menstrual History:  No LMP recorded.    Past Medical History  Diagnosis Date  . Chronic diarrhea     history  . Anxiety   . Abnormal stress test     Dr. Oleta Mouse  . Seasonal allergies   . Hypertension   . SVD (spontaneous vaginal delivery)     x 3  . Asthma     inhaler  . Migraines     otc med prn    No past surgical history on file.  Family History  Problem Relation Age of Onset  . Hypertension Mother   . Heart disease Father   . Diabetes Father   . Stroke Father   . Breast cancer Maternal Aunt   . Breast cancer Paternal Grandmother   . Heart disease Paternal Grandfather   . Diabetes Other     Social History:  reports that she has never smoked. She has never used smokeless tobacco. She reports that she drinks alcohol. She reports that she does not use illicit drugs.  Allergies:  Allergies  Allergen Reactions  . Amoxicillin-Pot Clavulanate     Passed out, has taken penicillin and amoxicillin w/no problems    No prescriptions prior to admission    ROS  There were no vitals taken for this visit. Physical Exam  Constitutional: She is oriented to person, place, and time. She appears well-developed and well-nourished.  Cardiovascular: Normal rate and regular rhythm.   Respiratory: Effort normal and breath sounds normal.  GI: Soft.  Genitourinary: Vagina normal and uterus normal.  Neurological: She is alert and oriented to person, place,  and time.    No results found for this or any previous visit (from the past 24 hour(s)).  No results found.  Assessment/Plan: Pt counseled on risks and benefits of endometrial ablation including bleeding and uterine perforation.  She will pretreat with cytotec 3 hours prior to the surgery to reduce this risk.    She desires to proceed. Oliver Pila 12/10/2013, 4:02 PM

## 2013-12-12 ENCOUNTER — Ambulatory Visit (HOSPITAL_COMMUNITY)
Admission: RE | Admit: 2013-12-12 | Discharge: 2013-12-12 | Disposition: A | Discharge status: Home / Self Care | Payer: 59 | Source: Ambulatory Visit | Attending: Obstetrics and Gynecology | Admitting: Obstetrics and Gynecology

## 2013-12-12 ENCOUNTER — Ambulatory Visit (HOSPITAL_COMMUNITY): Payer: 59 | Admitting: Anesthesiology

## 2013-12-12 ENCOUNTER — Encounter (HOSPITAL_COMMUNITY): Payer: 59 | Admitting: Anesthesiology

## 2013-12-12 ENCOUNTER — Encounter (HOSPITAL_COMMUNITY)
Admission: RE | Disposition: A | Discharge status: Home / Self Care | Payer: Self-pay | Source: Ambulatory Visit | Attending: Obstetrics and Gynecology

## 2013-12-12 DIAGNOSIS — N920 Excessive and frequent menstruation with regular cycle: Secondary | ICD-10-CM | POA: Insufficient documentation

## 2013-12-12 HISTORY — PX: HYSTEROSCOPY WITH NOVASURE: SHX5574

## 2013-12-12 SURGERY — HYSTEROSCOPY WITH NOVASURE
Anesthesia: General

## 2013-12-12 MED ORDER — MIDAZOLAM HCL 2 MG/2ML IJ SOLN
INTRAMUSCULAR | Status: DC | PRN
  Administered 2013-12-12: 2 mg via INTRAVENOUS

## 2013-12-12 MED ORDER — OXYCODONE-ACETAMINOPHEN 5-325 MG PO TABS: 1.0000 | ORAL_TABLET | ORAL | Status: DC | PRN

## 2013-12-12 MED ORDER — MEPERIDINE HCL 25 MG/ML IJ SOLN: 6.2500 mg | INTRAMUSCULAR | Status: DC | PRN

## 2013-12-12 MED ORDER — KETOROLAC TROMETHAMINE 30 MG/ML IJ SOLN: 15.0000 mg | Freq: Once | INTRAMUSCULAR | Status: DC | PRN

## 2013-12-12 MED ORDER — FENTANYL CITRATE 0.05 MG/ML IJ SOLN
INTRAMUSCULAR | Status: DC | PRN
  Administered 2013-12-12 (×2): 50 ug via INTRAVENOUS

## 2013-12-12 MED ORDER — PROPOFOL 10 MG/ML IV BOLUS
INTRAVENOUS | Status: DC | PRN
  Administered 2013-12-12: 150 mg via INTRAVENOUS

## 2013-12-12 MED ORDER — LIDOCAINE HCL 1 % IJ SOLN
INTRAMUSCULAR | Status: AC
  Filled 2013-12-12: qty 20

## 2013-12-12 MED ORDER — LIDOCAINE HCL (CARDIAC) 20 MG/ML IV SOLN
INTRAVENOUS | Status: AC
  Filled 2013-12-12: qty 5

## 2013-12-12 MED ORDER — METOCLOPRAMIDE HCL 5 MG/ML IJ SOLN: 10.0000 mg | Freq: Once | INTRAMUSCULAR | Status: DC | PRN

## 2013-12-12 MED ORDER — ONDANSETRON HCL 4 MG/2ML IJ SOLN
INTRAMUSCULAR | Status: AC
  Filled 2013-12-12: qty 2

## 2013-12-12 MED ORDER — OXYCODONE-ACETAMINOPHEN 5-325 MG PO TABS
1.0000 | ORAL_TABLET | ORAL | Status: DC | PRN
  Administered 2013-12-12: 1 via ORAL

## 2013-12-12 MED ORDER — FENTANYL CITRATE 0.05 MG/ML IJ SOLN
INTRAMUSCULAR | Status: AC
  Filled 2013-12-12: qty 2

## 2013-12-12 MED ORDER — SODIUM CHLORIDE 0.9 % IJ SOLN
INTRAMUSCULAR | Status: AC
  Filled 2013-12-12: qty 3

## 2013-12-12 MED ORDER — LACTATED RINGERS IV SOLN: INTRAVENOUS | Status: DC

## 2013-12-12 MED ORDER — OXYCODONE-ACETAMINOPHEN 5-325 MG PO TABS
ORAL_TABLET | ORAL | Status: AC
  Filled 2013-12-12: qty 1

## 2013-12-12 MED ORDER — HYDROMORPHONE HCL PF 1 MG/ML IJ SOLN
INTRAMUSCULAR | Status: AC
  Filled 2013-12-12: qty 1

## 2013-12-12 MED ORDER — PHENYLEPHRINE HCL 10 MG/ML IJ SOLN
INTRAMUSCULAR | Status: DC | PRN
  Administered 2013-12-12: 40 ug via INTRAVENOUS

## 2013-12-12 MED ORDER — LACTATED RINGERS IV SOLN
INTRAVENOUS | Status: DC
  Administered 2013-12-12 (×2): via INTRAVENOUS

## 2013-12-12 MED ORDER — GLYCOPYRROLATE 0.2 MG/ML IJ SOLN
INTRAMUSCULAR | Status: AC
  Filled 2013-12-12: qty 1

## 2013-12-12 MED ORDER — GLYCOPYRROLATE 0.2 MG/ML IJ SOLN
INTRAMUSCULAR | Status: DC | PRN
  Administered 2013-12-12: 0.2 mg via INTRAVENOUS

## 2013-12-12 MED ORDER — ONDANSETRON HCL 4 MG/2ML IJ SOLN
INTRAMUSCULAR | Status: DC | PRN
  Administered 2013-12-12: 4 mg via INTRAVENOUS

## 2013-12-12 MED ORDER — KETOROLAC TROMETHAMINE 30 MG/ML IJ SOLN
INTRAMUSCULAR | Status: DC | PRN
  Administered 2013-12-12: 30 mg via INTRAVENOUS

## 2013-12-12 MED ORDER — PROPOFOL 10 MG/ML IV EMUL
INTRAVENOUS | Status: AC
  Filled 2013-12-12: qty 20

## 2013-12-12 MED ORDER — PHENYLEPHRINE 40 MCG/ML (10ML) SYRINGE FOR IV PUSH (FOR BLOOD PRESSURE SUPPORT)
PREFILLED_SYRINGE | INTRAVENOUS | Status: AC
  Filled 2013-12-12: qty 5

## 2013-12-12 MED ORDER — LIDOCAINE HCL 1 % IJ SOLN
INTRAMUSCULAR | Status: DC | PRN
  Administered 2013-12-12: 20 mL

## 2013-12-12 MED ORDER — FENTANYL CITRATE 0.05 MG/ML IJ SOLN
25.0000 ug | INTRAMUSCULAR | Status: DC | PRN
  Administered 2013-12-12 (×4): 50 ug via INTRAVENOUS

## 2013-12-12 MED ORDER — HYDROMORPHONE HCL PF 1 MG/ML IJ SOLN
1.0000 mg | Freq: Once | INTRAMUSCULAR | Status: AC
  Administered 2013-12-12: 1 mg via INTRAVENOUS

## 2013-12-12 MED ORDER — LIDOCAINE HCL (CARDIAC) 20 MG/ML IV SOLN
INTRAVENOUS | Status: DC | PRN
  Administered 2013-12-12: 70 mg via INTRAVENOUS
  Administered 2013-12-12: 30 mg via INTRAVENOUS

## 2013-12-12 MED ORDER — MIDAZOLAM HCL 2 MG/2ML IJ SOLN
INTRAMUSCULAR | Status: AC
  Filled 2013-12-12: qty 2

## 2013-12-12 MED ORDER — KETOROLAC TROMETHAMINE 30 MG/ML IJ SOLN
INTRAMUSCULAR | Status: AC
  Filled 2013-12-12: qty 1

## 2013-12-12 SURGICAL SUPPLY — 13 items
ABLATOR ENDOMETRIAL BIPOLAR (ABLATOR) ×1 IMPLANT
CANISTER SUCT 3000ML (MISCELLANEOUS) ×2 IMPLANT
CATH ROBINSON RED A/P 16FR (CATHETERS) ×2 IMPLANT
CLOTH BEACON ORANGE TIMEOUT ST (SAFETY) ×2 IMPLANT
CONTAINER PREFILL 10% NBF 60ML (FORM) ×3 IMPLANT
DRSG TELFA 3X8 NADH (GAUZE/BANDAGES/DRESSINGS) ×2 IMPLANT
GLOVE BIO SURGEON STRL SZ 6.5 (GLOVE) ×4 IMPLANT
GOWN STRL REIN XL XLG (GOWN DISPOSABLE) ×4 IMPLANT
PACK HYSTEROSCOPY LF (CUSTOM PROCEDURE TRAY) ×2 IMPLANT
PAD DRESSING TELFA 3X8 NADH (GAUZE/BANDAGES/DRESSINGS) ×1 IMPLANT
PAD OB MATERNITY 4.3X12.25 (PERSONAL CARE ITEMS) ×2 IMPLANT
TOWEL OR 17X24 6PK STRL BLUE (TOWEL DISPOSABLE) ×4 IMPLANT
WATER STERILE IRR 1000ML POUR (IV SOLUTION) ×2 IMPLANT

## 2013-12-12 NOTE — Brief Op Note (Signed)
12/12/2013  1:12 PM  PATIENT:  Aleeya Debo  42 y.o. female  PRE-OPERATIVE DIAGNOSIS:  Menorrhagia  POST-OPERATIVE DIAGNOSIS:  Menorrhagia  PROCEDURE:  Procedure(s): HYSTEROSCOPY WITH NOVASURE (N/A) Curettage SURGEON:  Surgeon(s) and Role:    * Oliver Pila, MD - Primary    ANESTHESIA:   local and IV sedation  EBL:  Total I/O In: 1100 [I.V.:1100] Out: 93 [Urine:93]  BLOOD ADMINISTERED:none  DRAINS: none   LOCAL MEDICATIONS USED:  LIDOCAINE   SPECIMEN:  Endometrial currettings  DISPOSITION OF SPECIMEN:  PATHOLOGY  COUNTS:  YES  TOURNIQUET:  * No tourniquets in log *  DICTATION: .Dragon Dictation  PLAN OF CARE discharge from PACU  PATIENT DISPOSITION:  PACU - hemodynamically stable.

## 2013-12-12 NOTE — Op Note (Signed)
Operative note   Preoperative diagnosis Menorrhagia  Postoperative diagnosis Same  Procedure NovaSure endometrial ablation with hysteroscopy  Endometrial curettage   Surgeon  Dr. Huel Cote   Anesthesia  LMA and paracervical block   Fluids  Estimated blood loss minimal  Urine output 80 cc straight catheter prior procedure  IV fluids 800 cc LR  Hysteroscopic deficit 30 cc   Findings  Uterine cavity appeared normal but did have abundant endometrium which was sampled with curettage. There were no intracavitary lesions   Specimen  Endometrial curettings sent to pathology   Procedure note  Patient was taken to operating room where LMA anesthesia was obtained without difficulty she was then prepped and draped in the normal sterile fashion in the dorsal lithotomy position. An appropriate time out was performed. A speculum was then placed within the patient's vagina and the cervix identified and injected with 2 cc of 1% lidocaine on the anterior lip of the cervix. This was then grasped with a single-tooth tenaculum and an additional 9 cc each was placed at 2 and 10:00. The uterus was then sounded to approximately 9 and half centimeters and the cervix measured at 4 cm. The hysteroscope was then easily introduced into the uterine cavity and it appeared normal except for proliferative endometrium. The hysteroscope was then removed and a curettage was performed with the tissue and handed off to pathology. The NovaSure unit was then introduced into the uterine fundus and deployed. The cavity width was noted to be 4.2 and the cervical seal was put in place and a test run performed. With the test passed the unit was activated and a treatment time of approximately 90 seconds noted. At the conclusion of the treatment the NovaSure unit was removed without difficulty and handed off. The hysteroscope was reintroduced into the cavity and the endometrium appeared well blanched with no viable  endometrium visible in the cornua are lower uterine segment. The tenaculum was then removed and silver nitrate applied to the tenaculum site. All appeared hemostatic. Patient was then taken to the PACU in good condition awakened and all instruments and sponge counts were correct.

## 2013-12-12 NOTE — Anesthesia Preprocedure Evaluation (Addendum)
Anesthesia Evaluation  Patient identified by MRN, date of birth, ID band Patient awake    Reviewed: Allergy & Precautions, H&P , NPO status , Patient's Chart, lab work & pertinent test results, reviewed documented beta blocker date and time   History of Anesthesia Complications Negative for: history of anesthetic complications  Airway Mallampati: II TM Distance: >3 FB Neck ROM: full    Dental  (+) Teeth Intact   Pulmonary asthma (last inhaler use one year ago, seasonal only) ,  breath sounds clear to auscultation  Pulmonary exam normal       Cardiovascular hypertension, Pt. on home beta blockers Rhythm:regular Rate:Normal     Neuro/Psych  Headaches (once every 3-4 months), PSYCHIATRIC DISORDERS (anxiety)    GI/Hepatic Neg liver ROS, IBS   Endo/Other  negative endocrine ROS  Renal/GU negative Renal ROS  Female GU complaint     Musculoskeletal   Abdominal   Peds  Hematology negative hematology ROS (+)   Anesthesia Other Findings   Reproductive/Obstetrics negative OB ROS                         Anesthesia Physical Anesthesia Plan  ASA: II  Anesthesia Plan: General LMA   Post-op Pain Management:    Induction:   Airway Management Planned:   Additional Equipment:   Intra-op Plan:   Post-operative Plan:   Informed Consent: I have reviewed the patients History and Physical, chart, labs and discussed the procedure including the risks, benefits and alternatives for the proposed anesthesia with the patient or authorized representative who has indicated his/her understanding and acceptance.   Dental Advisory Given  Plan Discussed with: CRNA and Surgeon  Anesthesia Plan Comments:         Anesthesia Quick Evaluation

## 2013-12-12 NOTE — Progress Notes (Signed)
Patient ID: Briana Klein, female   DOB: [birth year], 42 y.o.   MRN: [id number] Per pt no changes in dictated H&P.  Brief exam WNL.

## 2013-12-12 NOTE — Anesthesia Postprocedure Evaluation (Signed)
  Anesthesia Post-op Note  Anesthesia Post Note  Patient: Briana Klein  Procedure(s) Performed: Procedure(s) (LRB): HYSTEROSCOPY WITH NOVASURE (N/A)  Anesthesia type: General  Patient location: PACU  Post pain: Pain level controlled  Post assessment: Post-op Vital signs reviewed  Last Vitals:  Filed Vitals:   12/12/13 1345  BP: 142/75  Pulse: 65  Temp:   Resp: 16    Post vital signs: Reviewed  Level of consciousness: sedated  Complications: No apparent anesthesia complications

## 2013-12-12 NOTE — Transfer of Care (Signed)
Immediate Anesthesia Transfer of Care Note  Patient: Briana Klein  Procedure(s) Performed: Procedure(s): HYSTEROSCOPY WITH NOVASURE (N/A)  Patient Location: PACU  Anesthesia Type:General  Level of Consciousness: awake, oriented and patient cooperative  Airway & Oxygen Therapy: Patient Spontanous Breathing and Patient connected to nasal cannula oxygen  Post-op Assessment: Report given to PACU RN and Post -op Vital signs reviewed and stable  Post vital signs: Reviewed and stable  Complications: No apparent anesthesia complications

## 2013-12-13 ENCOUNTER — Encounter (HOSPITAL_COMMUNITY): Payer: Self-pay | Admitting: Obstetrics and Gynecology

## 2013-12-22 ENCOUNTER — Encounter: Payer: Self-pay | Admitting: Internal Medicine

## 2013-12-22 ENCOUNTER — Ambulatory Visit (INDEPENDENT_AMBULATORY_CARE_PROVIDER_SITE_OTHER): Payer: 59 | Admitting: Internal Medicine

## 2013-12-22 VITALS — BP 120/90 | HR 56 | Temp 98.1°F | Wt 147.0 lb

## 2013-12-22 DIAGNOSIS — F419 Anxiety disorder, unspecified: Secondary | ICD-10-CM

## 2013-12-22 DIAGNOSIS — F411 Generalized anxiety disorder: Secondary | ICD-10-CM

## 2013-12-22 DIAGNOSIS — N920 Excessive and frequent menstruation with regular cycle: Secondary | ICD-10-CM

## 2013-12-22 DIAGNOSIS — I10 Essential (primary) hypertension: Secondary | ICD-10-CM

## 2013-12-22 MED ORDER — ALPRAZOLAM 0.25 MG PO TABS: 0.5000 mg | ORAL_TABLET | Freq: Three times a day (TID) | ORAL | Status: DC | PRN

## 2013-12-22 MED ORDER — BUSPIRONE HCL 5 MG PO TABS: 5.0000 mg | ORAL_TABLET | Freq: Three times a day (TID) | ORAL | Status: DC

## 2013-12-22 MED ORDER — NEBIVOLOL HCL 5 MG PO TABS: 2.5000 mg | ORAL_TABLET | Freq: Every day | ORAL | Status: DC

## 2013-12-22 NOTE — Progress Notes (Signed)
Pre-visit discussion using our clinic review tool. No additional management support is needed unless otherwise documented below in the visit note.  

## 2013-12-23 NOTE — Assessment & Plan Note (Signed)
Resolution of her menorrhagia after recent endometrial ablation.

## 2013-12-23 NOTE — Assessment & Plan Note (Signed)
Symptoms well controlled on BuSpar. Will continue.

## 2013-12-23 NOTE — Assessment & Plan Note (Signed)
BP Readings from Last 3 Encounters:  12/22/13 120/90  12/12/13 155/96  12/12/13 155/96   Blood pressure slightly elevated today however has generally been well-controlled at home with Bystolic. Will continue. If any persistent blood pressure greater than 140/90, will plan to increase Bystolic to 5 mg daily.

## 2013-12-23 NOTE — Progress Notes (Signed)
Subjective:    Patient ID: Briana Klein, female    DOB: 1971-05-31, 43 y.o.   MRN: [id number]  HPI 43 year old female with history of anxiety and hypertension presents for followup. She reports she has been feeling well. At her last visit, we started BuSpar to help with anxiety. She reports she is tolerating this medication well. She has noted a significant improvement in overall anxiety level. She denies any side effects from the medications except for a brief period of increased energy when she takes the medicine. She denies any new concerns today.  She recently underwent endometrial ablation which she reports she tolerated well.  Outpatient Prescriptions Prior to Visit  Medication Sig Dispense Refill  . albuterol (PROVENTIL HFA;VENTOLIN HFA) 108 (90 BASE) MCG/ACT inhaler Inhale 2 puffs into the lungs every 6 (six) hours as needed for wheezing or shortness of breath.      . ALPRAZolam (XANAX) 0.25 MG tablet Take 0.5 mg by mouth at bedtime as needed for sleep or anxiety.      . busPIRone (BUSPAR) 5 MG tablet Take 5 mg by mouth daily.      . nebivolol (BYSTOLIC) 5 MG tablet Take 2.5 mg by mouth at bedtime.       No facility-administered medications prior to visit.   BP 120/90  Pulse 56  Temp(Src) 98.1 F (36.7 C) (Oral)  Wt 147 lb (66.679 kg)  SpO2 98%  LMP 11/26/2013  Review of Systems  Constitutional: Negative for fever, chills, appetite change, fatigue and unexpected weight change.  HENT: Negative for congestion, ear pain, sinus pressure, sore throat, trouble swallowing and voice change.   Eyes: Negative for visual disturbance.  Respiratory: Negative for cough, shortness of breath, wheezing and stridor.   Cardiovascular: Negative for chest pain, palpitations and leg swelling.  Gastrointestinal: Negative for nausea, vomiting, abdominal pain, diarrhea, constipation, blood in stool, abdominal distention and anal bleeding.  Genitourinary: Negative for dysuria and flank pain.    Musculoskeletal: Negative for arthralgias, gait problem, myalgias and neck pain.  Skin: Negative for color change and rash.  Neurological: Negative for dizziness and headaches.  Hematological: Negative for adenopathy. Does not bruise/bleed easily.  Psychiatric/Behavioral: Negative for suicidal ideas, sleep disturbance and dysphoric mood. The patient is nervous/anxious.        Objective:   Physical Exam  Constitutional: She is oriented to person, place, and time. She appears well-developed and well-nourished. No distress.  HENT:  Head: Normocephalic and atraumatic.  Right Ear: External ear normal.  Left Ear: External ear normal.  Nose: Nose normal.  Mouth/Throat: Oropharynx is clear and moist. No oropharyngeal exudate.  Eyes: Conjunctivae are normal. Pupils are equal, round, and reactive to light. Right eye exhibits no discharge. Left eye exhibits no discharge. No scleral icterus.  Neck: Normal range of motion. Neck supple. No tracheal deviation present. No thyromegaly present.  Cardiovascular: Normal rate, regular rhythm, normal heart sounds and intact distal pulses.  Exam reveals no gallop and no friction rub.   No murmur heard. Pulmonary/Chest: Effort normal and breath sounds normal. No accessory muscle usage. Not tachypneic. No respiratory distress. She has no decreased breath sounds. She has no wheezes. She has no rhonchi. She has no rales. She exhibits no tenderness.  Musculoskeletal: Normal range of motion. She exhibits no edema and no tenderness.  Lymphadenopathy:    She has no cervical adenopathy.  Neurological: She is alert and oriented to person, place, and time. No cranial nerve deficit. She exhibits normal muscle tone. Coordination normal.  Skin: Skin is warm and dry. No rash noted. She is not diaphoretic. No erythema. No pallor.  Psychiatric: Her behavior is normal. Judgment and thought content normal. Her mood appears anxious.          Assessment & Plan:

## 2014-01-05 ENCOUNTER — Ambulatory Visit (INDEPENDENT_AMBULATORY_CARE_PROVIDER_SITE_OTHER): Payer: 59 | Admitting: Internal Medicine

## 2014-01-05 ENCOUNTER — Encounter: Payer: Self-pay | Admitting: Internal Medicine

## 2014-01-05 VITALS — BP 128/80 | HR 72 | Temp 98.0°F | Wt 146.0 lb

## 2014-01-05 DIAGNOSIS — J45909 Unspecified asthma, uncomplicated: Secondary | ICD-10-CM

## 2014-01-05 MED ORDER — AZITHROMYCIN 250 MG PO TABS: ORAL_TABLET | ORAL | Status: DC

## 2014-01-05 MED ORDER — HYDROCODONE-ACETAMINOPHEN 5-325 MG PO TABS: 1.0000 | ORAL_TABLET | Freq: Three times a day (TID) | ORAL | Status: DC | PRN

## 2014-01-05 MED ORDER — PREDNISONE (PAK) 10 MG PO TABS: ORAL_TABLET | ORAL | Status: DC

## 2014-01-05 NOTE — Progress Notes (Signed)
   Subjective:    Patient ID: Briana Klein, female    DOB: December 13, 1971, 43 y.o.   MRN: [id number]  HPI 43YO female presents for acute visit. Complains of cough with wheeze x1 week. Mild dyspnea. Possible fever initially, now resolved. Sore throat initially, now resolved. Taking OTC cold meds and using Advair and albuterol prn with no improvement.  Outpatient Encounter Prescriptions as of 01/05/2014  Medication Sig  . albuterol (PROVENTIL HFA;VENTOLIN HFA) 108 (90 BASE) MCG/ACT inhaler Inhale 2 puffs into the lungs every 6 (six) hours as needed for wheezing or shortness of breath.  . ALPRAZolam (XANAX) 0.25 MG tablet Take 2 tablets (0.5 mg total) by mouth 3 (three) times daily as needed for anxiety or sleep.  . busPIRone (BUSPAR) 5 MG tablet Take 1 tablet (5 mg total) by mouth 3 (three) times daily.  Marland Kitchen ibuprofen (ADVIL,MOTRIN) 200 MG tablet Take 400 mg by mouth daily as needed for cramping.  . nebivolol (BYSTOLIC) 5 MG tablet Take 0.5 tablets (2.5 mg total) by mouth at bedtime.   BP 128/80  Pulse 72  Temp(Src) 98 F (36.7 C) (Oral)  Wt 146 lb (66.225 kg)  SpO2 98%   Review of Systems  Constitutional: Positive for fatigue. Negative for fever, chills and unexpected weight change.  HENT: Positive for congestion and postnasal drip. Negative for ear discharge, ear pain, facial swelling, hearing loss, mouth sores, nosebleeds, rhinorrhea, sinus pressure, sneezing, sore throat, tinnitus, trouble swallowing and voice change.   Eyes: Negative for pain, discharge, redness and visual disturbance.  Respiratory: Positive for cough, chest tightness and shortness of breath. Negative for wheezing and stridor.   Cardiovascular: Negative for chest pain, palpitations and leg swelling.  Musculoskeletal: Negative for arthralgias, myalgias, neck pain and neck stiffness.  Skin: Negative for color change and rash.  Neurological: Negative for dizziness, weakness, light-headedness and headaches.  Hematological:  Negative for adenopathy.       Objective:   Physical Exam  Constitutional: She is oriented to person, place, and time. She appears well-developed and well-nourished. No distress.  HENT:  Head: Normocephalic and atraumatic.  Right Ear: External ear normal.  Left Ear: External ear normal.  Nose: Nose normal.  Mouth/Throat: Oropharynx is clear and moist. No oropharyngeal exudate.  Eyes: Conjunctivae are normal. Pupils are equal, round, and reactive to light. Right eye exhibits no discharge. Left eye exhibits no discharge. No scleral icterus.  Neck: Normal range of motion. Neck supple. No tracheal deviation present. No thyromegaly present.  Cardiovascular: Normal rate, regular rhythm, normal heart sounds and intact distal pulses.  Exam reveals no gallop and no friction rub.   No murmur heard. Pulmonary/Chest: Effort normal. No accessory muscle usage. Not tachypneic. No respiratory distress. She has decreased breath sounds (prolonged expiration). Wheezes: few scattered. She has no rhonchi. She has no rales. She exhibits no tenderness.  Musculoskeletal: Normal range of motion. She exhibits no edema and no tenderness.  Lymphadenopathy:    She has no cervical adenopathy.  Neurological: She is alert and oriented to person, place, and time. No cranial nerve deficit. She exhibits normal muscle tone. Coordination normal.  Skin: Skin is warm and dry. No rash noted. She is not diaphoretic. No erythema. No pallor.  Psychiatric: She has a normal mood and affect. Her behavior is normal. Judgment and thought content normal.          Assessment & Plan:

## 2014-01-05 NOTE — Patient Instructions (Signed)
Start Prednisone taper and Azithromycin. Continue Albuterol inhaled every 4 hours as needed for shortness of breath or wheezing.  Follow up immediately if any worsening shortness of breath, cough, fever, or other concerns.

## 2014-01-06 DIAGNOSIS — J45909 Unspecified asthma, uncomplicated: Secondary | ICD-10-CM | POA: Insufficient documentation

## 2014-01-06 NOTE — Assessment & Plan Note (Signed)
Symptoms are consistent with asthma with acute bronchitis. Likely triggered by viral upper respiratory infection. Will start prednisone taper and azithromycin. We've reviewed proper use of Advair twice daily and use of albuterol as needed for wheezing or cough. Will also start hydrocodone as needed for cough. Patient will call or return to clinic if symptoms are not improving.

## 2014-02-23 ENCOUNTER — Telehealth: Payer: Self-pay | Admitting: *Deleted

## 2014-02-23 DIAGNOSIS — F419 Anxiety disorder, unspecified: Secondary | ICD-10-CM

## 2014-02-23 MED ORDER — ALPRAZOLAM 0.25 MG PO TABS: 0.2500 mg | ORAL_TABLET | Freq: Three times a day (TID) | ORAL | Status: DC | PRN

## 2014-02-23 NOTE — Telephone Encounter (Signed)
Patient left voicemail stating the prescription sent to [facility] for her Alprazolam was incorrect so they would not refill it. It was sent at 6 tablets a day as needed which exceeds her plan quantity. The max they will approve is up to 4 times daily as needed. However patient stated she has never taken that many and not sure how the prescription got like that. She rarely take 2 a day, only if she has a bad day. Per voicemail left by patient she take it 1-2 tabs a day, no more than that. Please correct prescription and send to pharmacy

## 2014-02-23 NOTE — Telephone Encounter (Signed)
OK. I have re-printed Rx.

## 2014-02-23 NOTE — Telephone Encounter (Signed)
Prescription faxed to pharmacy.

## 2014-04-23 ENCOUNTER — Other Ambulatory Visit: Payer: Self-pay | Admitting: Internal Medicine

## 2014-04-23 NOTE — Telephone Encounter (Signed)
Ok refill? Not on current med list 

## 2014-05-03 ENCOUNTER — Encounter: Payer: Self-pay | Admitting: Internal Medicine

## 2014-05-03 ENCOUNTER — Ambulatory Visit (INDEPENDENT_AMBULATORY_CARE_PROVIDER_SITE_OTHER): Payer: 59 | Admitting: Internal Medicine

## 2014-05-03 VITALS — BP 110/94 | HR 55 | Temp 97.8°F | Ht 66.3 in | Wt 147.0 lb

## 2014-05-03 DIAGNOSIS — F411 Generalized anxiety disorder: Secondary | ICD-10-CM

## 2014-05-03 DIAGNOSIS — F419 Anxiety disorder, unspecified: Secondary | ICD-10-CM

## 2014-05-03 DIAGNOSIS — Z0000 Encounter for general adult medical examination without abnormal findings: Secondary | ICD-10-CM

## 2014-05-03 MED ORDER — ALPRAZOLAM 0.25 MG PO TABS: 0.2500 mg | ORAL_TABLET | Freq: Three times a day (TID) | ORAL | Status: DC | PRN

## 2014-05-03 NOTE — Assessment & Plan Note (Addendum)
General medical exam normal today including breast exam. PAP and pelvic deferred as scheduled with [facility] July 2015. Last PAP normal 2012. Mammogram ordered. Encouraged healthy diet and exercise. Immunizations are UTD. Labs through [facility]. Follow up 6 months and prn.

## 2014-05-03 NOTE — Progress Notes (Signed)
Subjective:    Patient ID: Briana Klein, female    DOB: [birth year], 43 y.o.   MRN: [id number]  HPI 43YO female presents for annual exam. Generally doing well. No concerns. Has follow up with GYN in July. Signed up with personal trainer. Following healthy diet.  Review of Systems  Constitutional: Negative for fever, chills, appetite change, fatigue and unexpected weight change.  HENT: Negative for congestion, ear pain, sinus pressure, sore throat, trouble swallowing and voice change.   Eyes: Negative for visual disturbance.  Respiratory: Negative for cough, shortness of breath, wheezing and stridor.   Cardiovascular: Negative for chest pain, palpitations and leg swelling.  Gastrointestinal: Negative for nausea, vomiting, abdominal pain, diarrhea, constipation, blood in stool, abdominal distention and anal bleeding.  Genitourinary: Negative for dysuria and flank pain.  Musculoskeletal: Negative for arthralgias, gait problem, myalgias and neck pain.  Skin: Negative for color change and rash.  Neurological: Negative for dizziness and headaches.  Hematological: Negative for adenopathy. Does not bruise/bleed easily.  Psychiatric/Behavioral: Negative for suicidal ideas, sleep disturbance and dysphoric mood. The patient is not nervous/anxious.        Objective:    BP 110/94  Pulse 55  Temp(Src) 97.8 F (36.6 C) (Oral)  Ht 5' 6.3" (1.684 m)  Wt 147 lb (66.679 kg)  BMI 23.51 kg/m2  SpO2 98%  LMP 04/29/2014 Physical Exam  Constitutional: She is oriented to person, place, and time. She appears well-developed and well-nourished. No distress.  HENT:  Head: Normocephalic and atraumatic.  Right Ear: External ear normal.  Left Ear: External ear normal.  Nose: Nose normal.  Mouth/Throat: Oropharynx is clear and moist. No oropharyngeal exudate.  Eyes: Conjunctivae are normal. Pupils are equal, round, and reactive to light. Right eye exhibits no discharge. Left eye exhibits no discharge.  No scleral icterus.  Neck: Normal range of motion. Neck supple. No tracheal deviation present. No thyromegaly present.  Cardiovascular: Normal rate, regular rhythm, normal heart sounds and intact distal pulses.  Exam reveals no gallop and no friction rub.   No murmur heard. Pulmonary/Chest: Effort normal and breath sounds normal. No accessory muscle usage. Not tachypneic. No respiratory distress. She has no decreased breath sounds. She has no wheezes. She has no rales. She exhibits no tenderness. Right breast exhibits no inverted nipple, no mass, no nipple discharge, no skin change and no tenderness. Left breast exhibits no inverted nipple, no mass, no nipple discharge, no skin change and no tenderness. Breasts are symmetrical.  Abdominal: Soft. Bowel sounds are normal. She exhibits no distension and no mass. There is no tenderness. There is no rebound and no guarding.  Musculoskeletal: Normal range of motion. She exhibits no edema and no tenderness.  Lymphadenopathy:    She has no cervical adenopathy.  Neurological: She is alert and oriented to person, place, and time. No cranial nerve deficit. She exhibits normal muscle tone. Coordination normal.  Skin: Skin is warm and dry. No rash noted. She is not diaphoretic. No erythema. No pallor.  Psychiatric: She has a normal mood and affect. Her behavior is normal. Judgment and thought content normal.          Assessment & Plan:   Problem List Items Addressed This Visit     Unprioritized   Anxiety   Relevant Medications      ALPRAZolam Duanne Moron) tablet   Routine general medical examination at a health care facility - Primary     General medical exam normal today including breast exam. PAP and pelvic  deferred as scheduled with [facility] July 2015. Last PAP normal 2012. Mammogram ordered. Encouraged healthy diet and exercise. Immunizations are UTD. Labs through [facility]. Follow up 6 months and prn.    Relevant Orders      MM Digital Screening      CBC  with Differential      Comprehensive metabolic panel      Lipid panel      Vit D  25 hydroxy (rtn osteoporosis monitoring)      TSH       Return in about 6 months (around 11/03/2014) for Recheck.

## 2014-05-03 NOTE — Progress Notes (Signed)
Pre visit review using our clinic review tool, if applicable. No additional management support is needed unless otherwise documented below in the visit note. 

## 2014-05-08 ENCOUNTER — Telehealth: Payer: Self-pay | Admitting: Internal Medicine

## 2014-05-08 NOTE — Telephone Encounter (Signed)
Labs 5/22 normal except for Vit D 19.5

## 2014-11-12 ENCOUNTER — Other Ambulatory Visit: Payer: Self-pay | Admitting: *Deleted

## 2014-11-12 DIAGNOSIS — F419 Anxiety disorder, unspecified: Secondary | ICD-10-CM

## 2014-11-12 MED ORDER — ALPRAZOLAM 0.25 MG PO TABS: 0.2500 mg | ORAL_TABLET | Freq: Three times a day (TID) | ORAL | Status: DC | PRN

## 2015-04-05 NOTE — Consult Note (Signed)
PATIENT NAME:  Briana Klein, MCCLARTY MR#:  [id number] DATE OF BIRTH:  05-19-1971  DATE OF CONSULTATION:  03/04/2013  REFERRING PHYSICIAN:   CONSULTING PHYSICIAN:  Dwayne D. Callwood, MD  INDICATION: Chest pain, near syncope.   REFERRING PHYSICIAN:  Ronette Deter, the patient was referred by [facility].  HISTORY OF PRESENT ILLNESS: The patient is a 44 year old white female with history of migraine, asthma, family history of coronary artery disease, reportedly was doing reasonably well then started having some chest pain and tightness in the mid chest without any significant shortness of breath or palpitations. She got lightheaded, finally came to the Emergency Room. Blood pressure was elevated to 081 systolic and subsequently had a followup blood pressure of 208. By the time she got to the Emergency Room her blood pressure was much improved. She had not had symptoms of this before. Chest tightness was maybe 3 out of 10. She had a functional study done more than 5 years ago so she presented for further evaluation and management.   REVIEW OF SYSTEMS: No blackout spells or syncope. She had near syncope. No weight loss or weight gain. No hemoptysis or hematemesis. Denies bright red blood per rectum. No vision changes or hearing changes.  No sputum production or cough.   PAST MEDICAL HISTORY:  Asthma and migraines.   FAMILY HISTORY: Coronary artery disease.   SOCIAL HISTORY: Works  smoking or alcohol consumption, married, children,   ALLERGIES: AUGMENTIN.   MEDICATIONS:  Ventolin 2 puffs every 4 hours p.r.n., Excedrin Migraine 1 tablet every 6 hours as needed, Tylenol with Codeine every 6 hours p.r.n. for migraines as needed.   PHYSICAL EXAMINATION: VITAL SIGNS: Blood pressure initially was 208/115 but subsequently came down to 150/90,  pulse 80, respiratory rate 16, afebrile.  HEENT: Normocephalic, atraumatic. Pupils equal and reactive to light.  NECK: Supple. No significant JVD, bruits or  adenopathy.  LUNGS: Clear to auscultation and percussion. No significant wheeze, rhonchi or rale.  HEART: Regular rhythm.  ABDOMEN:  Positive bowel sounds. No rebound, guarding or tenderness.  EXTREMITIES: Within normal limits.  NEUROLOGIC: Intact.  SKIN: Normal.   LABORATORY, DIAGNOSTIC AND RADIOLOGICAL DATA: EKG: Normal sinus rhythm, T wave inversion with ST segment depression inferolaterally, rate of 75. BNP was 58. Troponin less than 0.02. White count 7.5, hemoglobin 13.9. Chest x-ray negative.   ASSESSMENT: 1.  Chest pain, abnormal EKG.  2.  Hypertension.  3.  Migraines.  4.  Asthma.   PLAN:  1.  Agree with admit. Rule out for myocardial infarction. Followup cardiac enzymes. Followup EKGs. Place on telemetry. Place on aspirin. Consider beta blockade therapy. Consider short-term anticoagulation. Consider functional study. Echocardiogram may also be helpful.  2.  Abnormal EKG. This EKG can be consistent with underlying ischemia so I recommend further evaluation as we are doing with telemetry and functional study, probably echocardiogram. Make sure electrolytes are okay, appears to be okay right now.  3.  Hypertension. She has had malignant hypertension, seems to be somewhat improved and may be related to anxiety but we will continue to treat. Follow blood pressure and see whether long-term therapy as necessary.  4. Asthma. Continue inhalers as necessary. She has done reasonably well. No clear evidence of wheezing at this time but more aggressive therapy will be helpful if symptoms return.  5.  Migraine headaches. Continue her usual migraine headache regimen and have her continue to follow that up with her primary physician.  6.  Lipid management. We will proceed with lipid studies  to see if primary prevention is indicated at this point. Again, based on a functional study will determine whether further evaluation is necessary      ____________________________ Loran Senters. Clayborn Bigness,  MD ddc:cs D: 03/05/2013 14:40:00 ET T: 03/05/2013 15:04:39 ET JOB#: [id number]  cc: Dwayne D. Clayborn Bigness, MD, <Dictator> Yolonda Kida MD ELECTRONICALLY SIGNED 03/27/2013 7:09

## 2015-04-05 NOTE — H&P (Signed)
PATIENT NAME:  Briana Klein, Briana Klein MR#:  [id number] DATE OF BIRTH:  19-Jun-1971  DATE OF ADMISSION:  03/03/2013  PRIMARY CARE PHYSICIAN: Dr. Gilford Rile with [facility]. The patient used to see Dr. Francoise Schaumann in the past.   CHIEF COMPLAINT: Lightheadedness and chest tightness.   HISTORY OF PRESENT ILLNESS: This is a 44 year old Caucasian female patient with history of migraine and asthma who presents to the Emergency Room after she noticed some lightheadedness and tunnel vision while she was working at her desk. The patient also complains of some central chest tightness, very minimal, without any shortness of breath or palpitations. The lightheadedness resolved prior to coming to the Emergency Room, but at her work at [employer] when her blood pressure was checked, the systolic was elevated to 782 and here, her blood pressure has been recorded at 208/115 in the Emergency Room, presently at 147/94.   The patient never had similar symptoms. Her chest tightness is nonradiating, about 2 to 3/10. No aggravating or relieving factors.   The patient had a nuclear stress test in 2008 which was normal.   PAST MEDICAL HISTORY: Asthma, migraines.   FAMILY HISTORY: Coronary artery disease in father. No premature coronary artery disease in the family.   SOCIAL HISTORY: The patient works at [employer]. No smoking. No alcohol. No illicit drugs. Lives with her husband.   CODE STATUS: Full code.   ALLERGIES: AUGMENTIN.   HOME MEDICATIONS: Include: 1.  Ventolin 2 puffs every 4 hours as needed for shortness of breath.  2.  Excedrin Migraine 1 tablet every 6 hours as needed.  4.  Acetaminophen/hydrocodone 325/5, 1 tablet oral every 6 hours as needed for pain.  REVIEW OF SYSTEMS:    CONSTITUTIONAL: No fever, fatigue, weakness.  EYES: No blurred vision, pain, redness, but did have tunnel vision during the lightheadedness episode.  ENT: No tinnitus, ear pain, hearing loss.  RESPIRATORY: Has asthma. No cough, wheezing at  this time.  CARDIOVASCULAR: Has some chest tightness. No orthopnea, edema, arrhythmias.  GASTROINTESTINAL: No nausea, vomiting, diarrhea, abdominal pain.  GENITOURINARY: No dysuria, hematuria, frequency.  ENDOCRINE: No polyuria, nocturia, thyroid problems.  HEMATOLOGIC: No anemia, easy bruising, bleeding.  INTEGUMENTARY: No acne, rash, lesions.  MUSCULOSKELETAL: No back pain, shoulder pain, neck pain.  NEUROLOGIC: No focal numbness, weakness, seizures.  PSYCHIATRIC: No anxiety or depression, but the patient feels a little anxious now.   PHYSICAL EXAMINATION:  VITAL SIGNS: Temperature 98.2, blood pressure 208/115, presently 147/94 saturating 100% on room air.  GENERAL: Moderately built Caucasian female patient sitting up in bed, comfortable, conversational, cooperative with exam.  PSYCHIATRIC: Alert, oriented x 3. Mood and affect appropriate. Judgment intact.  HEENT: Atraumatic, normocephalic. Oral mucosa moist and pink. External ears and nose normal. No pallor. No icterus. Pupils bilaterally equal and reactive to light.  NECK: Supple. No thyromegaly. No palpable lymph nodes. Trachea midline. No carotid bruit. No JVD.  RESPIRATORY: Normal work of breathing. Clear to auscultation on both sides.  CARDIOVASCULAR: S1, S2, without any murmurs. No chest wall tenderness. Peripheral pulses 2+.  GASTROINTESTINAL: Soft, nontender. No hepatosplenomegaly palpable. Bowel sounds present.  SKIN: No petechiae, rash, ulcers. Warm and dry.  MUSCULOSKELETAL: No joint swelling, redness, effusion of the large joints. Normal muscle tone.  NEUROLOGICAL: Motor strength 5/5 in upper and lower extremities. Cranial nerves II through XII intact.   LABORATORY, DIAGNOSTIC AND RADIOLOGICAL DATA: BNP of 58. Troponin less than 0.02. WBC 7.5, hemoglobin 13.9.   EKG shows T wave inversions with ST depression in inferolateral  leads. The ST depression has resolved on a repeat EKG, but the inverted T waves persist.   Chest  x-ray shows no acute abnormalities.   ASSESSMENT AND PLAN:  1.  Chest tightness with lightheadedness and EKG changes: Will get an echocardiogram to look for any wall motion abnormalities. Get 2 more sets of cardiac enzymes. Will consult cardiology, Dr. Clayborn Bigness, to see if she needs a stress test versus catheterization with possible left anterior descending disease on the EKG changes. The patient will be started on aspirin. Check fasting lipid profile.  2.  Hypertension: New diagnosis. Will start the patient on Coreg. Intravenous hydralazine as needed.  3.  Migraine: To use her home medications as needed. 4.  Deep vein thrombosis prophylaxis with Lovenox.   CODE STATUS: Full code.   TIME SPENT: Time spent today on this case was 65 minutes, with more than 50% of time spent in coordination of care.    ____________________________ Leia Alf. Ariston Grandison, MD srs:jm D: 03/03/2013 17:45:59 ET T: 03/03/2013 18:34:06 ET JOB#: [id number]  cc: Alveta Heimlich R. Briyana Badman, MD, <Dictator> Eduard Clos. Gilford Rile, MD Alveta Heimlich Arlice Colt MD ELECTRONICALLY SIGNED 03/08/2013 19:48

## 2015-04-05 NOTE — Discharge Summary (Signed)
PATIENT NAME:  Briana Klein, Briana Klein MR#:  [id number] DATE OF BIRTH:  [birth year]  DATE OF ADMISSION:  03/03/2013 DATE OF DISCHARGE:  03/04/2013  PRIMARY CARE PHYSICIAN:  Dr. Ronette Deter  FINAL DIAGNOSES: 1.  Near-syncope and chest pain.  2.  Hypotension accelerated on admission, but normal upon discharge.  3.  Anxiety.  4.  Hypokalemia and hypomagnesemia.   MEDICATIONS ON DISCHARGE:  Include Xanax 0.25 mg at bedtime or as needed, Ventolin HFA CFC 2 puffs 4 times a day, acetaminophen hydrocodone 325/5 one tablet every 6 hours as needed for cough, magnesium oxide 400 mg daily for 5 days.   DIET:  Regular consistency.   ACTIVITY:  As tolerated.  Follow up with Dr. Ronette Deter in 1 week.   HOSPITAL COURSE:  The patient was admitted March 21, discharged March 04, 2013, initially admitted as an observation for lightheadedness and chest tightness.   HISTORY OF PRESENT ILLNESS: A 44 year old female, history of migraine and asthma, presented to the ER after lightheadedness and tunnel vision, then developed chest pain and found to have a very elevated blood pressure. She was admitted as an observation. Cardiology consultation was obtained from Dr. Clayborn Bigness. The patient was initially started on Coreg for blood pressure.  LABORATORY AND RADIOLOGICAL DATA: Included a chest x-ray that was no acute cardiopulmonary abnormality.  BNP 58.  Urinalysis 2+ blood. Troponin negative x 4, white blood cell count 7.5, H and H 13.9 and 39.5, platelet count of 226. Chemistry showed a glucose of 133, BUN 12, creatinine 0.82, sodium 141, potassium 3.2, chloride 107, CO2 27, calcium 8.3. Repeat potassium 4.0, magnesium 1.7.  HOSPITAL COURSE PER PROBLEM LIST:  1.  Near-syncope and chest pain. The patient felt much better the next day. She was given IV fluid hydration over the hospital course: Cardiac enzymes are negative. Stress test read by Dr. Clayborn Bigness was negative.  2.  The patient did have accelerated  hypertension on admission, was started on Coreg but this was stopped when blood pressure was 101/62. Blood pressure was 137/86 upon discharge. The patient would like to hold off on blood pressure medication at this point. Followup next week with Dr. Gilford Rile for a repeat blood pressure check.  3.  Anxiety.  She does take p.r.n. Xanax.  4.  Hypokalemia and hypomagnesemia.  These were both replaced during the hospital course. I do recommend the followup check as outpatient.  TIME SPENT ON DISCHARGE:  40 minutes    ____________________________ Tana Conch. Leslye Peer, MD rjw:ce D: 03/04/2013 15:41:06 ET T: 03/04/2013 16:26:24 ET JOB#: [id number]  cc: Tana Conch. Leslye Peer, MD, <Dictator>  Dr. Ronette Deter Marisue Brooklyn MD ELECTRONICALLY SIGNED 03/15/2013 10:29

## 2015-05-10 ENCOUNTER — Other Ambulatory Visit: Payer: Self-pay | Admitting: *Deleted

## 2015-05-10 DIAGNOSIS — F419 Anxiety disorder, unspecified: Secondary | ICD-10-CM

## 2015-05-10 NOTE — Telephone Encounter (Signed)
Last visit 05/03/14, no upcoming appt scheduled

## 2015-05-11 MED ORDER — ALPRAZOLAM 0.25 MG PO TABS: 0.2500 mg | ORAL_TABLET | Freq: Three times a day (TID) | ORAL | Status: DC | PRN

## 2015-08-02 ENCOUNTER — Other Ambulatory Visit: Payer: Self-pay | Admitting: Internal Medicine

## 2015-08-02 ENCOUNTER — Encounter: Payer: Self-pay | Admitting: Internal Medicine

## 2015-08-02 ENCOUNTER — Ambulatory Visit (INDEPENDENT_AMBULATORY_CARE_PROVIDER_SITE_OTHER): Payer: Self-pay | Admitting: Internal Medicine

## 2015-08-02 VITALS — BP 138/72 | HR 80 | Temp 98.2°F | Ht 66.25 in | Wt 136.2 lb

## 2015-08-02 DIAGNOSIS — F419 Anxiety disorder, unspecified: Secondary | ICD-10-CM

## 2015-08-02 DIAGNOSIS — Z0000 Encounter for general adult medical examination without abnormal findings: Secondary | ICD-10-CM | POA: Diagnosis not present

## 2015-08-02 DIAGNOSIS — I10 Essential (primary) hypertension: Secondary | ICD-10-CM

## 2015-08-02 MED ORDER — NEBIVOLOL HCL 5 MG PO TABS: 2.5000 mg | ORAL_TABLET | Freq: Every day | ORAL | Status: DC

## 2015-08-02 MED ORDER — ALPRAZOLAM 0.25 MG PO TABS: 0.2500 mg | ORAL_TABLET | Freq: Three times a day (TID) | ORAL | Status: DC | PRN

## 2015-08-02 NOTE — Patient Instructions (Signed)

## 2015-08-02 NOTE — Progress Notes (Signed)
Subjective:    Patient ID: Briana Klein, female    DOB: 1971/10/07, 44 y.o.   MRN: [id number]  HPI  44YO female presents for annual exam.  Feeling well. No concerns today. Working with trainer to increase exercise. Follows healthy diet.  Past Medical History  Diagnosis Date  . Chronic diarrhea     history  . Anxiety   . Abnormal stress test     Dr. Mancel Parsons  . Seasonal allergies   . Hypertension   . SVD (spontaneous vaginal delivery)     x 3  . Asthma     inhaler  . Migraines     otc med prn   Family History  Problem Relation Age of Onset  . Hypertension Mother   . Heart disease Father   . Diabetes Father   . Stroke Father   . Breast cancer Maternal Aunt   . Breast cancer Paternal Grandmother   . Heart disease Paternal Grandfather   . Diabetes Other    Past Surgical History  Procedure Laterality Date  . Hysteroscopy with novasure N/A 12/12/2013    Procedure: HYSTEROSCOPY WITH NOVASURE;  Surgeon: Logan Bores, MD;  Location: [facility];  Service: Gynecology;  Laterality: N/A;   Social History   Social History  . Marital Status: Married    Spouse Name: N/A  . Number of Children: 3  . Years of Education: N/A   Occupational History  . [employer]   Social History Main Topics  . Smoking status: Never Smoker   . Smokeless tobacco: Never Used  . Alcohol Use: Yes     Comment: social  . Drug Use: No  . Sexual Activity: Yes    Birth Control/ Protection: None     Comment: husband vasectomy   Other Topics Concern  . None   Social History Narrative   Lives in [place]. Works in [employer] at [employer]. 3 children. 2 cats and dog. Diet: regular.    Daily Caffeine Use:  1 sweet tea and 1 soda   Regular Exercise -  NO             Review of Systems  Constitutional: Negative for fever, chills, appetite change, fatigue and unexpected weight change.  Eyes: Negative for visual disturbance.  Respiratory: Negative for shortness of breath.     Cardiovascular: Negative for chest pain and leg swelling.  Gastrointestinal: Negative for nausea, vomiting, abdominal pain, diarrhea, constipation and blood in stool.  Musculoskeletal: Negative for myalgias and arthralgias.  Skin: Negative for color change and rash.  Neurological: Negative for weakness and headaches.  Hematological: Negative for adenopathy. Does not bruise/bleed easily.  Psychiatric/Behavioral: Negative for suicidal ideas, sleep disturbance and dysphoric mood. The patient is nervous/anxious.        Objective:    BP 138/72 mmHg  Pulse 80  Temp(Src) 98.2 F (36.8 C) (Oral)  Ht 5' 6.25" (1.683 m)  Wt 136 lb 3.2 oz (61.78 kg)  BMI 21.81 kg/m2  SpO2 99%  LMP 07/24/2015 Physical Exam  Constitutional: She is oriented to person, place, and time. She appears well-developed and well-nourished. No distress.  HENT:  Head: Normocephalic and atraumatic.  Right Ear: External ear normal.  Left Ear: External ear normal.  Nose: Nose normal.  Mouth/Throat: Oropharynx is clear and moist. No oropharyngeal exudate.  Eyes: Conjunctivae are normal. Pupils are equal, round, and reactive to light. Right eye exhibits no discharge. Left eye exhibits no discharge. No scleral icterus.  Neck:  Normal range of motion. Neck supple. No tracheal deviation present. No thyromegaly present.  Cardiovascular: Normal rate, regular rhythm, normal heart sounds and intact distal pulses.  Exam reveals no gallop and no friction rub.   No murmur heard. Pulmonary/Chest: Effort normal and breath sounds normal. No respiratory distress. She has no wheezes. She has no rales. She exhibits no tenderness.  Abdominal: Soft. Bowel sounds are normal. She exhibits no distension and no mass. There is no tenderness. There is no rebound and no guarding.  Genitourinary: Rectum normal, vagina normal and uterus normal. No breast swelling, tenderness, discharge or bleeding. Pelvic exam was performed with patient supine. There is  no rash, tenderness or lesion on the right labia. There is no rash, tenderness or lesion on the left labia. Uterus is not enlarged and not tender. Cervix exhibits no motion tenderness, no discharge and no friability. Right adnexum displays no mass, no tenderness and no fullness. Left adnexum displays no mass, no tenderness and no fullness. No erythema or tenderness in the vagina. No vaginal discharge found.  Musculoskeletal: Normal range of motion. She exhibits no edema or tenderness.  Lymphadenopathy:    She has no cervical adenopathy.  Neurological: She is alert and oriented to person, place, and time. No cranial nerve deficit. She exhibits normal muscle tone. Coordination normal.  Skin: Skin is warm and dry. No rash noted. She is not diaphoretic. No erythema. No pallor.  Psychiatric: She has a normal mood and affect. Her behavior is normal. Judgment and thought content normal.          Assessment & Plan:   Problem List Items Addressed This Visit      Unprioritized   Anxiety   Relevant Medications   ALPRAZolam (XANAX) 0.25 MG tablet   HTN (hypertension)   Relevant Medications   nebivolol (BYSTOLIC) 5 MG tablet   Routine general medical examination at a health care facility - Primary    General medical exam normal today including breast and pelvic exam. PAP pending. Mammogram ordered. Labs as ordered through [facility]. Encouraged healthy diet and exercise. Flu vaccine this fall.      Relevant Orders   MM Digital Screening       Return in about 6 months (around 02/02/2016) for Recheck.

## 2015-08-02 NOTE — Progress Notes (Signed)
Pre visit review using our clinic review tool, if applicable. No additional management support is needed unless otherwise documented below in the visit note. 

## 2015-08-02 NOTE — Assessment & Plan Note (Signed)
General medical exam normal today including breast and pelvic exam. PAP pending. Mammogram ordered. Labs as ordered through [facility]. Encouraged healthy diet and exercise. Flu vaccine this fall.

## 2015-08-05 ENCOUNTER — Encounter: Payer: Self-pay | Admitting: *Deleted

## 2015-08-08 LAB — PAP LB, RFX HPV ASCU: PAP Smear Comment: 0

## 2015-08-16 LAB — CBC AND DIFFERENTIAL
HCT: 43 % (ref 36–46)
Hemoglobin: 14.5 g/dL (ref 12.0–16.0)
Neutrophils Absolute: 5 /uL
PLATELETS: 230 10*3/uL (ref 150–399)
WBC: 7.5 10*3/mL

## 2015-08-16 LAB — HEPATIC FUNCTION PANEL
ALT: 13 U/L (ref 7–35)
AST: 12 U/L — AB (ref 13–35)
Alkaline Phosphatase: 60 U/L (ref 25–125)
Bilirubin, Total: 0.6 mg/dL

## 2015-08-16 LAB — BASIC METABOLIC PANEL
BUN: 19 mg/dL (ref 4–21)
CREATININE: 0.7 mg/dL (ref 0.5–1.1)
Glucose: 75 mg/dL
Potassium: 3.9 mmol/L (ref 3.4–5.3)
Sodium: 141 mmol/L (ref 137–147)

## 2015-08-26 ENCOUNTER — Telehealth: Payer: Self-pay | Admitting: Internal Medicine

## 2015-08-26 NOTE — Telephone Encounter (Signed)
Labs normal.

## 2016-01-24 ENCOUNTER — Other Ambulatory Visit: Payer: Self-pay

## 2016-01-24 DIAGNOSIS — F419 Anxiety disorder, unspecified: Secondary | ICD-10-CM

## 2016-01-24 MED ORDER — ALPRAZOLAM 0.25 MG PO TABS: 0.2500 mg | ORAL_TABLET | Freq: Three times a day (TID) | ORAL | Status: DC | PRN

## 2016-01-24 NOTE — Telephone Encounter (Signed)
Please advise refill, last one was in August.

## 2016-02-05 ENCOUNTER — Ambulatory Visit (INDEPENDENT_AMBULATORY_CARE_PROVIDER_SITE_OTHER): Payer: 59 | Admitting: Internal Medicine

## 2016-02-05 ENCOUNTER — Encounter: Payer: Self-pay | Admitting: Internal Medicine

## 2016-02-05 VITALS — BP 124/77 | HR 60 | Temp 97.8°F | Ht 66.0 in | Wt 142.5 lb

## 2016-02-05 DIAGNOSIS — I10 Essential (primary) hypertension: Secondary | ICD-10-CM | POA: Diagnosis not present

## 2016-02-05 DIAGNOSIS — F411 Generalized anxiety disorder: Secondary | ICD-10-CM | POA: Diagnosis not present

## 2016-02-05 NOTE — Assessment & Plan Note (Signed)
Symptoms are most consistent with GAD. Discussed this with pt. Encouraged psychiatry evaluation. Encouraged her to consider adding an SSRI such as lexapro.Encouraged regular exercise and activities such as yoga to help with anxiety.  Continue prn Alprazolam.

## 2016-02-05 NOTE — Assessment & Plan Note (Signed)
BP Readings from Last 3 Encounters:  02/05/16 124/77  08/02/15 138/72  05/03/14 110/94   BP is generally well controlled out side of panic attacks. Encouraged her to consider treatment for her anxiety as noted. Will continue Bystolic.

## 2016-02-05 NOTE — Progress Notes (Signed)
Pre visit review using our clinic review tool, if applicable. No additional management support is needed unless otherwise documented below in the visit note. 

## 2016-02-05 NOTE — Patient Instructions (Addendum)
Try using plain Allegra, Zyrtec, or Claritin without decongestant to help with allergies.  You can also add nasal steroid such as Flonase or Nasacort.  Consider adding Lexapro to help control anxiety.  Also consider evaluation with psychiatry, Dr. Nicolasa Ducking.

## 2016-02-05 NOTE — Progress Notes (Signed)
Subjective:    Patient ID: Briana Klein, female    DOB: 12-28-1970, 45 y.o.   MRN: [id number]  HPI 45YO female presents for follow up.  HTN - In a couple of situations since last visit, BP has spiked up as high as 180/100. In setting of feeling anxious. No clear triggers. Took Alprazolam on one occasion with some improvement.  Also had one instance in which she had head tilted back looking upwards and felt dizzy and anxious.  She notes significant anxiety during most days. For example, recently when her son spilled some food on his shirt, she was very anxious and had a panic attack, having to stop her car on the side of the road because of worry about what others would think about it. She notes social anxiety, often avoiding interaction with others.  Sometimes feels like she has cotton in ears and feels pressure in ears. Started Allegra-D.   Wt Readings from Last 3 Encounters:  02/05/16 142 lb 8 oz (64.638 kg)  08/02/15 136 lb 3.2 oz (61.78 kg)  05/03/14 147 lb (66.679 kg)   BP Readings from Last 3 Encounters:  02/05/16 124/77  08/02/15 138/72  05/03/14 110/94    Past Medical History  Diagnosis Date  . Chronic diarrhea     history  . Anxiety   . Abnormal stress test     Dr. Mancel Parsons  . Seasonal allergies   . Hypertension   . SVD (spontaneous vaginal delivery)     x 3  . Asthma     inhaler  . Migraines     otc med prn   Family History  Problem Relation Age of Onset  . Hypertension Mother   . Heart disease Father   . Diabetes Father   . Stroke Father   . Breast cancer Maternal Aunt   . Breast cancer Paternal Grandmother   . Heart disease Paternal Grandfather   . Diabetes Other    Past Surgical History  Procedure Laterality Date  . Hysteroscopy with novasure N/A 12/12/2013    Procedure: HYSTEROSCOPY WITH NOVASURE;  Surgeon: Logan Bores, MD;  Location: [facility];  Service: Gynecology;  Laterality: N/A;   Social History   Social History  . Marital  Status: Married    Spouse Name: N/A  . Number of Children: 3  . Years of Education: N/A   Occupational History  . [employer]   Social History Main Topics  . Smoking status: Never Smoker   . Smokeless tobacco: Never Used  . Alcohol Use: Yes     Comment: social  . Drug Use: No  . Sexual Activity: Yes    Birth Control/ Protection: None     Comment: husband vasectomy   Other Topics Concern  . None   Social History Narrative   Lives in [place]. Works in [employer] at [employer]. 3 children. 2 cats and dog. Diet: regular.    Daily Caffeine Use:  1 sweet tea and 1 soda   Regular Exercise -  NO             Review of Systems  Constitutional: Negative for fever, chills, appetite change, fatigue and unexpected weight change.  Eyes: Negative for visual disturbance.  Respiratory: Positive for chest tightness. Negative for cough and shortness of breath.   Cardiovascular: Positive for palpitations. Negative for chest pain and leg swelling.  Gastrointestinal: Negative for nausea, abdominal pain, diarrhea and constipation.  Skin: Negative for color change and rash.  Neurological: Positive for dizziness and light-headedness.  Hematological: Negative for adenopathy. Does not bruise/bleed easily.  Psychiatric/Behavioral: Positive for behavioral problems and agitation. Negative for sleep disturbance and dysphoric mood. The patient is nervous/anxious.        Objective:    BP 124/77 mmHg  Pulse 60  Temp(Src) 97.8 F (36.6 C) (Oral)  Ht 5\' 6"  (1.676 m)  Wt 142 lb 8 oz (64.638 kg)  BMI 23.01 kg/m2  SpO2 99%  LMP 01/16/2016 Physical Exam  Constitutional: She is oriented to person, place, and time. She appears well-developed and well-nourished. No distress.  HENT:  Head: Normocephalic and atraumatic.  Right Ear: External ear normal.  Left Ear: External ear normal.  Nose: Nose normal.  Mouth/Throat: Oropharynx is clear and moist. No oropharyngeal exudate.  Eyes:  Conjunctivae are normal. Pupils are equal, round, and reactive to light. Right eye exhibits no discharge. Left eye exhibits no discharge. No scleral icterus.  Neck: Normal range of motion. Neck supple. No tracheal deviation present. No thyromegaly present.  Cardiovascular: Normal rate, regular rhythm, normal heart sounds and intact distal pulses.  Exam reveals no gallop and no friction rub.   No murmur heard. Pulmonary/Chest: Effort normal and breath sounds normal. No respiratory distress. She has no wheezes. She has no rales. She exhibits no tenderness.  Musculoskeletal: Normal range of motion. She exhibits no edema or tenderness.  Lymphadenopathy:    She has no cervical adenopathy.  Neurological: She is alert and oriented to person, place, and time. No cranial nerve deficit. She exhibits normal muscle tone. Coordination normal.  Skin: Skin is warm and dry. No rash noted. She is not diaphoretic. No erythema. No pallor.  Psychiatric: Her behavior is normal. Judgment and thought content normal. Her mood appears anxious. Cognition and memory are normal.          Assessment & Plan:   Problem List Items Addressed This Visit      Unprioritized   Anxiety - Primary    Symptoms are most consistent with GAD. Discussed this with pt. Encouraged psychiatry evaluation. Encouraged her to consider adding an SSRI such as lexapro.Encouraged regular exercise and activities such as yoga to help with anxiety.  Continue prn Alprazolam.      HTN (hypertension)    BP Readings from Last 3 Encounters:  02/05/16 124/77  08/02/15 138/72  05/03/14 110/94   BP is generally well controlled out side of panic attacks. Encouraged her to consider treatment for her anxiety as noted. Will continue Bystolic.          Return in about 6 months (around 08/04/2016) for Physical.  Ronette Deter, MD Internal Medicine [facility]

## 2016-07-27 ENCOUNTER — Other Ambulatory Visit: Payer: Self-pay

## 2016-07-27 DIAGNOSIS — F419 Anxiety disorder, unspecified: Secondary | ICD-10-CM

## 2016-07-27 NOTE — Telephone Encounter (Signed)
Please advise refill, last refill was on 2/10 #90 with one refill, follow up with arnett on 08/06/2016.

## 2016-07-28 MED ORDER — ALPRAZOLAM 0.25 MG PO TABS: 0.2500 mg | ORAL_TABLET | Freq: Three times a day (TID) | ORAL | 0 refills | Status: DC | PRN

## 2016-07-28 NOTE — Telephone Encounter (Signed)
Assessed  Controlled Substance Reporting System and did not see suspicious activity at this time under patient's name and address in Epic.   

## 2016-08-05 ENCOUNTER — Encounter: Payer: 59 | Admitting: Internal Medicine

## 2016-08-06 ENCOUNTER — Ambulatory Visit (INDEPENDENT_AMBULATORY_CARE_PROVIDER_SITE_OTHER): Payer: 59 | Admitting: Family

## 2016-08-06 ENCOUNTER — Encounter: Payer: Self-pay | Admitting: Family

## 2016-08-06 VITALS — BP 110/80 | HR 56 | Temp 97.4°F | Resp 16 | Ht 66.0 in | Wt 149.0 lb

## 2016-08-06 DIAGNOSIS — I10 Essential (primary) hypertension: Secondary | ICD-10-CM

## 2016-08-06 DIAGNOSIS — Z0000 Encounter for general adult medical examination without abnormal findings: Secondary | ICD-10-CM

## 2016-08-06 DIAGNOSIS — F419 Anxiety disorder, unspecified: Secondary | ICD-10-CM

## 2016-08-06 NOTE — Assessment & Plan Note (Signed)
Discussed risks of BZDs and discussed long term with concern of their use. Assessed [place] Controlled Substance Reporting System and did not see suspicious activity at this time under patient's name and address in Epic. Will continue to discuss trialing safer medications for anxiety as well as counseling at future visits.  New CSC.

## 2016-08-06 NOTE — Assessment & Plan Note (Signed)
Controlled. No recent labile blood pressure. Per chart review, patient had adequate work up in 2014. Will continue to current regimen and follow.

## 2016-08-06 NOTE — Progress Notes (Signed)
Subjective:    Patient ID: Briana Klein, female    DOB: 1971/07/08, 45 y.o.   MRN: [id number]  CC: Briana Klein is a 45 y.o. female who presents today for physical exam.    HPI: Patient here for complete physical and to establish care.   HTN- Has been labile for quite some time; states the Dr. Gilford Rile and her 'couldn't quite figure it' out as it would span from lows less than 90 SBP and run up SBP 200 at the highest one episode. Believes it is her anxiety. No h/o kidney problems. Had a normal stress test. On 1/2 tablet of bystolic of qhs. Has BP cuff at home.   Asthma- Stable; April through May, takes Allegra during that time; uses inhaler during that time. No wheezing, SOB.  Anxiety- Medically treated for 10 years, better than its been in the past.Related to work. Has anxiety about crowds, shopping. 3 panic attacks in past 5 years. Takes 0.25 mg qhs to go sleep. 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     Reviewed chart: renal duplex 2014 negative for renal stenosis, normal echo 03/2013, and normal myocardial scan 03/2013.    Breast Cancer Screening: Mammogram 2016 , normal. Due  Cervical Cancer Screening: UTD. 2016 normal.  Immunizations       Tetanus - UTD        HIV Screening- Candidate for  Labs: Screening labs today. Exercise: Gets regular exercise.  Alcohol use: Occasional.  Smoking/tobacco use: Nonsmoker.  Regular dental exams: UTD Wears seat belt: Yes.  HISTORY:  Past Medical History:  Diagnosis Date  . Abnormal stress test    Dr. Mancel Parsons  . Anxiety   . Asthma    inhaler  . Chronic diarrhea    history  . Hypertension   . Migraines    otc med prn  . Seasonal allergies   . SVD (spontaneous vaginal delivery)    x 3    Past Surgical History:  Procedure Laterality Date  . HYSTEROSCOPY WITH NOVASURE N/A 12/12/2013   Procedure: HYSTEROSCOPY WITH NOVASURE;  Surgeon: Logan Bores, MD;  Location: [facility];  Service:  Gynecology;  Laterality: N/A;   Family History  Problem Relation Age of Onset  . Hypertension Mother   . Heart disease Father   . Diabetes Father   . Stroke Father   . Heart disease Paternal Grandfather   . Breast cancer Maternal Aunt   . Breast cancer Paternal Grandmother   . Diabetes Other       ALLERGIES: Amoxicillin-pot clavulanate  Current Outpatient Prescriptions on File Prior to Visit  Medication Sig Dispense Refill  . ADVAIR HFA 115-21 MCG/ACT inhaler INHALE 2 PUFFS INTO THE LUNGS 2 (TWO) TIMES DAILY AS NEEDED. 12 g 0  . albuterol (PROVENTIL HFA;VENTOLIN HFA) 108 (90 BASE) MCG/ACT inhaler Inhale 2 puffs into the lungs every 6 (six) hours as needed for wheezing or shortness of breath.    . ALPRAZolam (XANAX) 0.25 MG tablet Take 1 tablet (0.25 mg total) by mouth 3 (three) times daily as needed for anxiety or sleep. 90 tablet 0  . busPIRone (BUSPAR) 5 MG tablet Take 1 tablet (5 mg total) by mouth 3 (three) times daily. 270 tablet 3  . ibuprofen (ADVIL,MOTRIN) 200 MG tablet Take 400 mg by mouth daily as needed for cramping.    . nebivolol (BYSTOLIC) 5 MG tablet Take 0.5 tablets (2.5 mg total) by mouth at bedtime. [place]  tablet 3  . VENTOLIN HFA 108 (90 BASE) MCG/ACT inhaler INHALE 2 PUFFS INTO THE LUNGS EVERY 6 (SIX) HOURS AS NEEDED. 18 each 0   No current facility-administered medications on file prior to visit.     Social History  Substance Use Topics  . Smoking status: Never Smoker  . Smokeless tobacco: Never Used  . Alcohol use Yes     Comment: social    Review of Systems  Constitutional: Negative for chills, fever and unexpected weight change.  HENT: Negative for congestion.   Respiratory: Negative for cough.   Cardiovascular: Negative for chest pain, palpitations and leg swelling.  Gastrointestinal: Negative for nausea and vomiting.  Musculoskeletal: Negative for arthralgias and myalgias.  Skin: Negative for rash.  Neurological: Negative for headaches.    Hematological: Negative for adenopathy.  Psychiatric/Behavioral: Negative for confusion.      Objective:    BP 110/80 (BP Location: Left Arm, Patient Position: Sitting, Cuff Size: Large)   Pulse (!) 56   Temp 97.4 F (36.3 C) (Oral)   Resp 16   Ht 5\' 6"  (1.676 m)   Wt 149 lb (67.6 kg)   SpO2 99%   BMI 24.05 kg/m   BP Readings from Last 3 Encounters:  08/06/16 110/80  02/05/16 124/77  08/02/15 138/72   Wt Readings from Last 3 Encounters:  08/06/16 149 lb (67.6 kg)  02/05/16 142 lb 8 oz (64.6 kg)  08/02/15 136 lb 3.2 oz (61.8 kg)    Physical Exam  Constitutional: She appears well-developed and well-nourished.  Eyes: Conjunctivae are normal.  Neck: No thyroid mass and no thyromegaly present.  Cardiovascular: Normal rate, regular rhythm, normal heart sounds and normal pulses.   Pulmonary/Chest: Effort normal and breath sounds normal. She has no wheezes. She has no rhonchi. She has no rales. Right breast exhibits no inverted nipple, no mass, no nipple discharge, no skin change and no tenderness. Left breast exhibits no inverted nipple, no mass, no nipple discharge, no skin change and no tenderness. Breasts are symmetrical.  CBE performed.   Lymphadenopathy:       Head (right side): No submental, no submandibular, no tonsillar, no preauricular, no posterior auricular and no occipital adenopathy present.       Head (left side): No submental, no submandibular, no tonsillar, no preauricular, no posterior auricular and no occipital adenopathy present.    She has no cervical adenopathy.       Right cervical: No superficial cervical, no deep cervical and no posterior cervical adenopathy present.      Left cervical: No superficial cervical, no deep cervical and no posterior cervical adenopathy present.    She has no axillary adenopathy.  Neurological: She is alert.  Skin: Skin is warm and dry.  Psychiatric: She has a normal mood and affect. Her speech is normal and behavior is  normal. Thought content normal.  Vitals reviewed.      Assessment & Plan:   Problem List Items Addressed This Visit      Cardiovascular and Mediastinum   HTN (hypertension)    Controlled. No recent labile blood pressure. Per chart review, patient had adequate work up in 2014. Will continue to current regimen and follow.         Other   Anxiety    Discussed risks of BZDs and discussed long term with concern of their use. Assessed [place] Controlled Substance Reporting System and did not see suspicious activity at this time under patient's name and address in Epic. Will  continue to discuss trialing safer medications for anxiety as well as counseling at future visits.  New CSC.        Routine general medical examination at a health care facility    Scheduled mammogram today. Pap UTD. Immunizations UTD. Screening labs including HIV ordered today. Encouraged exercise in lieu of weight gain over past year.        Other Visit Diagnoses    Routine health maintenance    -  Primary   Relevant Orders   CBC with Differential/Platelet   Comprehensive metabolic panel   Hemoglobin A1c   HIV antibody   Lipid panel   TSH   VITAMIN D 25 Hydroxy (Vit-D Deficiency, Fractures)       I am having Ms. Doren maintain her ibuprofen, albuterol, busPIRone, VENTOLIN HFA, ADVAIR HFA, nebivolol, and ALPRAZolam.   No orders of the defined types were placed in this encounter.   Return precautions given.   Risks, benefits, and alternatives of the medications and treatment plan prescribed today were discussed, and patient expressed understanding.   Education regarding symptom management and diagnosis given to patient on AVS.   Continue to follow with Rica Mast, MD for routine health maintenance.   Aubre D Obremski and I agreed with plan.   Mable Paris, FNP

## 2016-08-06 NOTE — Patient Instructions (Signed)
My pleasure meeting you.  Health Maintenance, Female Adopting a healthy lifestyle and getting preventive care can go a long way to promote health and wellness. Talk with your health care provider about what schedule of regular examinations is right for you. This is a good chance for you to check in with your provider about disease prevention and staying healthy. In between checkups, there are plenty of things you can do on your own. Experts have done a lot of research about which lifestyle changes and preventive measures are most likely to keep you healthy. Ask your health care provider for more information. WEIGHT AND DIET  Eat a healthy diet  Be sure to include plenty of vegetables, fruits, low-fat dairy products, and lean protein.  Do not eat a lot of foods high in solid fats, added sugars, or salt.  Get regular exercise. This is one of the most important things you can do for your health.  Most adults should exercise for at least 150 minutes each week. The exercise should increase your heart rate and make you sweat (moderate-intensity exercise).  Most adults should also do strengthening exercises at least twice a week. This is in addition to the moderate-intensity exercise.  Maintain a healthy weight  Body mass index (BMI) is a measurement that can be used to identify possible weight problems. It estimates body fat based on height and weight. Your health care provider can help determine your BMI and help you achieve or maintain a healthy weight.  For females 93 years of age and older:   A BMI below 18.5 is considered underweight.  A BMI of 18.5 to 24.9 is normal.  A BMI of 25 to 29.9 is considered overweight.  A BMI of 30 and above is considered obese.  Watch levels of cholesterol and blood lipids  You should start having your blood tested for lipids and cholesterol at 45 years of age, then have this test every 5 years.  You may need to have your cholesterol levels checked  more often if:  Your lipid or cholesterol levels are high.  You are older than 45 years of age.  You are at high risk for heart disease.  CANCER SCREENING   Lung Cancer  Lung cancer screening is recommended for adults 33-39 years old who are at high risk for lung cancer because of a history of smoking.  A yearly low-dose CT scan of the lungs is recommended for people who:  Currently smoke.  Have quit within the past 15 years.  Have at least a 30-pack-year history of smoking. A pack year is smoking an average of one pack of cigarettes a day for 1 year.  Yearly screening should continue until it has been 15 years since you quit.  Yearly screening should stop if you develop a health problem that would prevent you from having lung cancer treatment.  Breast Cancer  Practice breast self-awareness. This means understanding how your breasts normally appear and feel.  It also means doing regular breast self-exams. Let your health care provider know about any changes, no matter how small.  If you are in your 20s or 30s, you should have a clinical breast exam (CBE) by a health care provider every 1-3 years as part of a regular health exam.  If you are 50 or older, have a CBE every year. Also consider having a breast X-ray (mammogram) every year.  If you have a family history of breast cancer, talk to your health care provider about  genetic screening.  If you are at high risk for breast cancer, talk to your health care provider about having an MRI and a mammogram every year.  Breast cancer gene (BRCA) assessment is recommended for women who have family members with BRCA-related cancers. BRCA-related cancers include:  Breast.  Ovarian.  Tubal.  Peritoneal cancers.  Results of the assessment will determine the need for genetic counseling and BRCA1 and BRCA2 testing. Cervical Cancer Your health care provider may recommend that you be screened regularly for cancer of the pelvic organs  (ovaries, uterus, and vagina). This screening involves a pelvic examination, including checking for microscopic changes to the surface of your cervix (Pap test). You may be encouraged to have this screening done every 3 years, beginning at age 21.  For women ages 30-65, health care providers may recommend pelvic exams and Pap testing every 3 years, or they may recommend the Pap and pelvic exam, combined with testing for human papilloma virus (HPV), every 5 years. Some types of HPV increase your risk of cervical cancer. Testing for HPV may also be done on women of any age with unclear Pap test results.  Other health care providers may not recommend any screening for nonpregnant women who are considered low risk for pelvic cancer and who do not have symptoms. Ask your health care provider if a screening pelvic exam is right for you.  If you have had past treatment for cervical cancer or a condition that could lead to cancer, you need Pap tests and screening for cancer for at least 20 years after your treatment. If Pap tests have been discontinued, your risk factors (such as having a new sexual partner) need to be reassessed to determine if screening should resume. Some women have medical problems that increase the chance of getting cervical cancer. In these cases, your health care provider may recommend more frequent screening and Pap tests. Colorectal Cancer  This type of cancer can be detected and often prevented.  Routine colorectal cancer screening usually begins at 45 years of age and continues through 45 years of age.  Your health care provider may recommend screening at an earlier age if you have risk factors for colon cancer.  Your health care provider may also recommend using home test kits to check for hidden blood in the stool.  A small camera at the end of a tube can be used to examine your colon directly (sigmoidoscopy or colonoscopy). This is done to check for the earliest forms of  colorectal cancer.  Routine screening usually begins at age 50.  Direct examination of the colon should be repeated every 5-10 years through 45 years of age. However, you may need to be screened more often if early forms of precancerous polyps or small growths are found. Skin Cancer  Check your skin from head to toe regularly.  Tell your health care provider about any new moles or changes in moles, especially if there is a change in a mole's shape or color.  Also tell your health care provider if you have a mole that is larger than the size of a pencil eraser.  Always use sunscreen. Apply sunscreen liberally and repeatedly throughout the day.  Protect yourself by wearing long sleeves, pants, a wide-brimmed hat, and sunglasses whenever you are outside. HEART DISEASE, DIABETES, AND HIGH BLOOD PRESSURE   High blood pressure causes heart disease and increases the risk of stroke. High blood pressure is more likely to develop in:  People who have blood   pressure in the high end of the normal range (130-139/85-89 mm Hg).  People who are overweight or obese.  People who are African American.  If you are 56-39 years of age, have your blood pressure checked every 3-5 years. If you are 63 years of age or older, have your blood pressure checked every year. You should have your blood pressure measured twice--once when you are at a hospital or clinic, and once when you are not at a hospital or clinic. Record the average of the two measurements. To check your blood pressure when you are not at a hospital or clinic, you can use:  An automated blood pressure machine at a pharmacy.  A home blood pressure monitor.  If you are between 1 years and 44 years old, ask your health care provider if you should take aspirin to prevent strokes.  Have regular diabetes screenings. This involves taking a blood sample to check your fasting blood sugar level.  If you are at a normal weight and have a low risk  for diabetes, have this test once every three years after 45 years of age.  If you are overweight and have a high risk for diabetes, consider being tested at a younger age or more often. PREVENTING INFECTION  Hepatitis B  If you have a higher risk for hepatitis B, you should be screened for this virus. You are considered at high risk for hepatitis B if:  You were born in a country where hepatitis B is common. Ask your health care provider which countries are considered high risk.  Your parents were born in a high-risk country, and you have not been immunized against hepatitis B (hepatitis B vaccine).  You have HIV or AIDS.  You use needles to inject street drugs.  You live with someone who has hepatitis B.  You have had sex with someone who has hepatitis B.  You get hemodialysis treatment.  You take certain medicines for conditions, including cancer, organ transplantation, and autoimmune conditions. Hepatitis C  Blood testing is recommended for:  Everyone born from [birth year] through 1965.  Anyone with known risk factors for hepatitis C. Sexually transmitted infections (STIs)  You should be screened for sexually transmitted infections (STIs) including gonorrhea and chlamydia if:  You are sexually active and are younger than 45 years of age.  You are older than 45 years of age and your health care provider tells you that you are at risk for this type of infection.  Your sexual activity has changed since you were last screened and you are at an increased risk for chlamydia or gonorrhea. Ask your health care provider if you are at risk.  If you do not have HIV, but are at risk, it may be recommended that you take a prescription medicine daily to prevent HIV infection. This is called pre-exposure prophylaxis (PrEP). You are considered at risk if:  You are sexually active and do not regularly use condoms or know the HIV status of your partner(s).  You take drugs by injection.  You  are sexually active with a partner who has HIV. Talk with your health care provider about whether you are at high risk of being infected with HIV. If you choose to begin PrEP, you should first be tested for HIV. You should then be tested every 3 months for as long as you are taking PrEP.  PREGNANCY   If you are premenopausal and you may become pregnant, ask your health care provider about preconception  counseling.  If you may become pregnant, take 400 to 800 micrograms (mcg) of folic acid every day.  If you want to prevent pregnancy, talk to your health care provider about birth control (contraception). OSTEOPOROSIS AND MENOPAUSE   Osteoporosis is a disease in which the bones lose minerals and strength with aging. This can result in serious bone fractures. Your risk for osteoporosis can be identified using a bone density scan.  If you are 39 years of age or older, or if you are at risk for osteoporosis and fractures, ask your health care provider if you should be screened.  Ask your health care provider whether you should take a calcium or vitamin D supplement to lower your risk for osteoporosis.  Menopause may have certain physical symptoms and risks.  Hormone replacement therapy may reduce some of these symptoms and risks. Talk to your health care provider about whether hormone replacement therapy is right for you.  HOME CARE INSTRUCTIONS   Schedule regular health, dental, and eye exams.  Stay current with your immunizations.   Do not use any tobacco products including cigarettes, chewing tobacco, or electronic cigarettes.  If you are pregnant, do not drink alcohol.  If you are breastfeeding, limit how much and how often you drink alcohol.  Limit alcohol intake to no more than 1 drink per day for nonpregnant women. One drink equals 12 ounces of beer, 5 ounces of wine, or 1 ounces of hard liquor.  Do not use street drugs.  Do not share needles.  Ask your health care provider  for help if you need support or information about quitting drugs.  Tell your health care provider if you often feel depressed.  Tell your health care provider if you have ever been abused or do not feel safe at home.   This information is not intended to replace advice given to you by your health care provider. Make sure you discuss any questions you have with your health care provider.   Document Released: 06/15/2011 Document Revised: 12/21/2014 Document Reviewed: 11/01/2013 Elsevier Interactive Patient Education [employer].

## 2016-08-06 NOTE — Assessment & Plan Note (Signed)
Scheduled mammogram today. Pap UTD. Immunizations UTD. Screening labs including HIV ordered today. Encouraged exercise in lieu of weight gain over past year.

## 2016-08-07 LAB — LIPID PANEL
CHOLESTEROL TOTAL: 228 mg/dL — AB (ref 100–199)
Chol/HDL Ratio: 3.8 ratio units (ref 0.0–4.4)
HDL: 60 mg/dL (ref >39)
LDL Calculated: 130 mg/dL — ABNORMAL HIGH (ref 0–99)
Triglycerides: 191 mg/dL — ABNORMAL HIGH (ref 0–149)
VLDL CHOLESTEROL CAL: 38 mg/dL (ref 5–40)

## 2016-08-07 LAB — COMPREHENSIVE METABOLIC PANEL
ALT: 14 IU/L (ref 0–32)
AST: 18 IU/L (ref 0–40)
Albumin/Globulin Ratio: 1.6 (ref 1.2–2.2)
Albumin: 4.8 g/dL (ref 3.5–5.5)
Alkaline Phosphatase: 64 IU/L (ref 39–117)
BUN/Creatinine Ratio: 24 — ABNORMAL HIGH (ref 9–23)
BUN: 18 mg/dL (ref 6–24)
Bilirubin Total: 0.7 mg/dL (ref 0.0–1.2)
CALCIUM: 9.8 mg/dL (ref 8.7–10.2)
CO2: 27 mmol/L (ref 18–29)
CREATININE: 0.75 mg/dL (ref 0.57–1.00)
Chloride: 100 mmol/L (ref 96–106)
GFR, EST AFRICAN AMERICAN: 111 mL/min/{1.73_m2} (ref >59)
GFR, EST NON AFRICAN AMERICAN: 97 mL/min/{1.73_m2} (ref >59)
GLUCOSE: 81 mg/dL (ref 65–99)
Globulin, Total: 3 g/dL (ref 1.5–4.5)
Potassium: 4.3 mmol/L (ref 3.5–5.2)
Sodium: 140 mmol/L (ref 134–144)
TOTAL PROTEIN: 7.8 g/dL (ref 6.0–8.5)

## 2016-08-07 LAB — CBC WITH DIFFERENTIAL/PLATELET
BASOS ABS: 0.1 10*3/uL (ref 0.0–0.2)
BASOS: 1 %
EOS (ABSOLUTE): 0.3 10*3/uL (ref 0.0–0.4)
Eos: 4 %
Hematocrit: 41.5 % (ref 34.0–46.6)
Hemoglobin: 14 g/dL (ref 11.1–15.9)
IMMATURE GRANS (ABS): 0 10*3/uL (ref 0.0–0.1)
IMMATURE GRANULOCYTES: 0 %
LYMPHS: 36 %
Lymphocytes Absolute: 2.3 10*3/uL (ref 0.7–3.1)
MCH: 31.3 pg (ref 26.6–33.0)
MCHC: 33.7 g/dL (ref 31.5–35.7)
MCV: 93 fL (ref 79–97)
Monocytes Absolute: 0.4 10*3/uL (ref 0.1–0.9)
Monocytes: 6 %
NEUTROS PCT: 53 %
Neutrophils Absolute: 3.3 10*3/uL (ref 1.4–7.0)
PLATELETS: 250 10*3/uL (ref 150–379)
RBC: 4.48 x10E6/uL (ref 3.77–5.28)
RDW: 14.4 % (ref 12.3–15.4)
WBC: 6.3 10*3/uL (ref 3.4–10.8)

## 2016-08-07 LAB — VITAMIN D 25 HYDROXY (VIT D DEFICIENCY, FRACTURES): Vit D, 25-Hydroxy: 23.4 ng/mL — ABNORMAL LOW (ref 30.0–100.0)

## 2016-08-07 LAB — TSH: TSH: 3.96 u[IU]/mL (ref 0.450–4.500)

## 2016-08-07 LAB — HEMOGLOBIN A1C
ESTIMATED AVERAGE GLUCOSE: 88 mg/dL
Hgb A1c MFr Bld: 4.7 % — ABNORMAL LOW (ref 4.8–5.6)

## 2016-08-07 LAB — HIV ANTIBODY (ROUTINE TESTING W REFLEX): HIV SCREEN 4TH GENERATION: NONREACTIVE

## 2016-08-08 ENCOUNTER — Other Ambulatory Visit: Payer: Self-pay | Admitting: Family

## 2016-08-08 DIAGNOSIS — E559 Vitamin D deficiency, unspecified: Secondary | ICD-10-CM

## 2016-08-08 DIAGNOSIS — E785 Hyperlipidemia, unspecified: Secondary | ICD-10-CM | POA: Insufficient documentation

## 2016-09-03 ENCOUNTER — Telehealth: Payer: Self-pay | Admitting: Internal Medicine

## 2016-09-03 NOTE — Telephone Encounter (Signed)
Left pt a vm to call office to get flu shot.

## 2016-10-14 ENCOUNTER — Other Ambulatory Visit: Payer: Self-pay | Admitting: *Deleted

## 2016-10-14 DIAGNOSIS — I10 Essential (primary) hypertension: Secondary | ICD-10-CM

## 2016-10-14 MED ORDER — NEBIVOLOL HCL 5 MG PO TABS: 2.5000 mg | ORAL_TABLET | Freq: Every day | ORAL | 1 refills | Status: DC

## 2016-10-20 ENCOUNTER — Other Ambulatory Visit: Payer: Self-pay | Admitting: Family

## 2016-10-20 DIAGNOSIS — F419 Anxiety disorder, unspecified: Secondary | ICD-10-CM

## 2016-10-20 NOTE — Telephone Encounter (Signed)
Pt lvm stating that [facility] has told her to reach out to our office regarding why her refills have been delayed. She did not state which rx was delayed

## 2016-10-20 NOTE — Telephone Encounter (Signed)
Not primary.  Need more info.

## 2016-10-21 NOTE — Telephone Encounter (Signed)
Spoke to patient she just needs Xanax .

## 2016-10-21 NOTE — Telephone Encounter (Signed)
Left message to call.

## 2016-10-21 NOTE — Telephone Encounter (Signed)
Would you call patient and get more detail?  Which refills does she need?

## 2016-10-22 ENCOUNTER — Other Ambulatory Visit: Payer: Self-pay | Admitting: Family

## 2016-10-22 DIAGNOSIS — F419 Anxiety disorder, unspecified: Secondary | ICD-10-CM

## 2016-10-22 MED ORDER — ALPRAZOLAM 0.25 MG PO TABS: 0.2500 mg | ORAL_TABLET | Freq: Three times a day (TID) | ORAL | 0 refills | Status: DC | PRN

## 2016-10-22 NOTE — Telephone Encounter (Signed)
Called and [facility] @ 10:29am @ [phone]) asking the pt to RTC regarding to which pharmacy to send prescription to.//[facility]

## 2016-10-22 NOTE — Telephone Encounter (Signed)
Faxing to [facility] per patient request

## 2016-10-26 ENCOUNTER — Telehealth: Payer: Self-pay | Admitting: Family

## 2017-01-20 ENCOUNTER — Telehealth: Payer: Self-pay

## 2017-01-20 DIAGNOSIS — F419 Anxiety disorder, unspecified: Secondary | ICD-10-CM

## 2017-01-20 MED ORDER — ALPRAZOLAM 0.25 MG PO TABS: 0.2500 mg | ORAL_TABLET | Freq: Three times a day (TID) | ORAL | 0 refills | Status: DC | PRN

## 2017-01-20 NOTE — Telephone Encounter (Signed)
I looked up patient on  Controlled Substances Reporting System and saw no activity that raised concern of inappropriate use.   

## 2017-01-20 NOTE — Addendum Note (Signed)
Addended by: Burnard Hawthorne on: 01/20/2017 11:57 AM   Modules accepted: Orders

## 2017-01-20 NOTE — Telephone Encounter (Signed)
Refill request for Xanax, last seen [id number], last filled [id number].  Please advise.

## 2017-01-27 ENCOUNTER — Telehealth: Payer: Self-pay | Admitting: Family

## 2017-01-27 NOTE — Telephone Encounter (Signed)
[facility]ed [facility]. Was sent to wrong pharmacy.

## 2017-01-27 NOTE — Telephone Encounter (Signed)
Pt called and states that her pharmacy is claiming that they have no received refills for her. Pt needs her ALPRAZolam (XANAX) 0.25 MG tablet. Please advise, thank you!  [place], [facility]  Call pt @ [phone]

## 2017-04-16 ENCOUNTER — Telehealth: Payer: Self-pay

## 2017-04-16 ENCOUNTER — Other Ambulatory Visit: Payer: Self-pay | Admitting: Family

## 2017-04-16 DIAGNOSIS — F419 Anxiety disorder, unspecified: Secondary | ICD-10-CM

## 2017-04-16 NOTE — Telephone Encounter (Signed)
Refill request for Xanax, last seen [id number], last filled [id number].  Please advise.

## 2017-04-19 MED ORDER — ALPRAZOLAM 0.25 MG PO TABS: 0.2500 mg | ORAL_TABLET | Freq: Three times a day (TID) | ORAL | 0 refills | Status: DC | PRN

## 2017-04-19 NOTE — Addendum Note (Signed)
Addended by: Burnard Hawthorne on: 04/19/2017 01:24 PM   Modules accepted: Orders

## 2017-07-12 ENCOUNTER — Other Ambulatory Visit: Payer: Self-pay | Admitting: Family

## 2017-07-12 DIAGNOSIS — F419 Anxiety disorder, unspecified: Secondary | ICD-10-CM

## 2017-07-18 ENCOUNTER — Other Ambulatory Visit: Payer: Self-pay | Admitting: Family

## 2017-07-18 DIAGNOSIS — F419 Anxiety disorder, unspecified: Secondary | ICD-10-CM

## 2017-07-19 MED ORDER — ALPRAZOLAM 0.25 MG PO TABS: 0.2500 mg | ORAL_TABLET | Freq: Three times a day (TID) | ORAL | 0 refills | Status: DC | PRN

## 2017-07-20 ENCOUNTER — Telehealth: Payer: Self-pay | Admitting: Family

## 2017-07-20 DIAGNOSIS — F419 Anxiety disorder, unspecified: Secondary | ICD-10-CM

## 2017-07-20 MED ORDER — ALPRAZOLAM 0.25 MG PO TABS: 0.2500 mg | ORAL_TABLET | Freq: Every evening | ORAL | 0 refills | Status: DC | PRN

## 2017-07-20 NOTE — Telephone Encounter (Signed)
Call pt  She is on a controlled substance. Please let her know she needs to be seen at least twice per year For future refills, needs to be seen  I refilled for one month

## 2017-07-20 NOTE — Telephone Encounter (Signed)
Pt called back returning your call. Thank you! °

## 2017-07-20 NOTE — Telephone Encounter (Signed)
Left message for patient to return call back.  

## 2017-07-21 NOTE — Telephone Encounter (Signed)
Left message for patient to return call back.  

## 2017-07-23 NOTE — Telephone Encounter (Signed)
Left message for patient to return call back.  

## 2017-07-27 NOTE — Telephone Encounter (Signed)
Patient has been informed and had PE scheduled [id number]

## 2017-07-27 NOTE — Telephone Encounter (Signed)
You may call again or mail letter

## 2017-08-09 ENCOUNTER — Encounter: Payer: Self-pay | Admitting: Family

## 2017-08-09 ENCOUNTER — Ambulatory Visit (INDEPENDENT_AMBULATORY_CARE_PROVIDER_SITE_OTHER): Payer: 59 | Admitting: Family

## 2017-08-09 VITALS — BP 98/68 | HR 64 | Temp 98.5°F | Ht 66.0 in | Wt 149.6 lb

## 2017-08-09 DIAGNOSIS — I10 Essential (primary) hypertension: Secondary | ICD-10-CM

## 2017-08-09 DIAGNOSIS — D1779 Benign lipomatous neoplasm of other sites: Secondary | ICD-10-CM | POA: Diagnosis not present

## 2017-08-09 DIAGNOSIS — Z0000 Encounter for general adult medical examination without abnormal findings: Secondary | ICD-10-CM | POA: Diagnosis not present

## 2017-08-09 DIAGNOSIS — D1720 Benign lipomatous neoplasm of skin and subcutaneous tissue of unspecified limb: Secondary | ICD-10-CM

## 2017-08-09 DIAGNOSIS — F419 Anxiety disorder, unspecified: Secondary | ICD-10-CM | POA: Diagnosis not present

## 2017-08-09 MED ORDER — NEBIVOLOL HCL 5 MG PO TABS: 2.5000 mg | ORAL_TABLET | Freq: Every day | ORAL | 1 refills | Status: DC

## 2017-08-09 MED ORDER — SERTRALINE HCL 50 MG PO TABS: 50.0000 mg | ORAL_TABLET | Freq: Every day | ORAL | 3 refills | Status: DC

## 2017-08-09 MED ORDER — ALPRAZOLAM 0.25 MG PO TABS: 0.2500 mg | ORAL_TABLET | Freq: Every evening | ORAL | 1 refills | Status: DC | PRN

## 2017-08-09 NOTE — Progress Notes (Signed)
Subjective:    Patient ID: Briana Klein, female    DOB: Aug 14, 1971, 46 y.o.   MRN: [id number]  CC: Briana Klein is a 46 y.o. female who presents today for physical exam.    HPI: Complains of 2 little 'knot's on left lateral leg over past year.  No changes, tenderness, drainage, bone pain , fever, night sweats, unintentional weight loss  HTN- compliant with medication, taking half tablet. SBP averaging < 130 Denies exertional chest pain or pressure, numbness or tingling radiating to left arm or jaw, palpitations, dizziness, frequent headaches, changes in vision, or shortness of breath.   GAD- Still has a an occasional panic attack. 0.25mg  qhs xanax. Work is well. No thoughts of hurting herself or anyone else.         Colorectal Cancer Screening: no early family history Breast Cancer Screening: Mammogram due Cervical Cancer Screening: UTD 2016, no malignancy; no HPV testing Bone Health screening/DEXA for 65+: No increased fracture risk. Defer screening at this time. Lung Cancer Screening: Doesn't have 30 year pack year history and age > 43 years.       Tetanus - utd        Labs: Screening labs today. Exercise: Gets regular exercise.  Alcohol use: Occasionally.  Smoking/tobacco use: Nonsmoker.  Regular dental exams:UTD Wears seat belt: Yes. Skin: follows with dermatology; no h/o skin cancer  HISTORY:  Past Medical History:  Diagnosis Date  . Abnormal stress test    Dr. Mancel Parsons  . Anxiety   . Asthma    inhaler  . Chronic diarrhea    history  . Hypertension   . Migraines    ot[facility] med prn  . Seasonal allergies   . SVD (spontaneous vaginal delivery)    x 3    Past Surgical History:  Procedure Laterality Date  . HYSTEROSCOPY WITH NOVASURE N/A 12/12/2013   Procedure: HYSTEROSCOPY WITH NOVASURE;  Surgeon: Logan Bores, MD;  Location: [facility];  Service: Gynecology;  Laterality: N/A;   Family History  Problem Relation Age of Onset  . Hypertension Mother     . Heart disease Father   . Diabetes Father   . Stroke Father   . Heart disease Paternal Grandfather   . Breast cancer Maternal Aunt   . Colon cancer Maternal Aunt 80  . Breast cancer Paternal Grandmother   . Diabetes Other   . Melanoma Neg Hx       ALLERGIES: Amoxicillin-pot clavulanate  Current Outpatient Prescriptions on File Prior to Visit  Medication Sig Dispense Refill  . ADVAIR HFA 115-21 MCG/ACT inhaler INHALE 2 PUFFS INTO THE LUNGS 2 (TWO) TIMES DAILY AS NEEDED. 12 g 0  . albuterol (PROVENTIL HFA;VENTOLIN HFA) 108 (90 BASE) MCG/ACT inhaler Inhale 2 puffs into the lungs every 6 (six) hours as needed for wheezing or shortness of breath.    Marland Kitchen ibuprofen (ADVIL,MOTRIN) 200 MG tablet Take 400 mg by mouth daily as needed for cramping.    . VENTOLIN HFA 108 (90 BASE) MCG/ACT inhaler INHALE 2 PUFFS INTO THE LUNGS EVERY 6 (SIX) HOURS AS NEEDED. 18 each 0   No current facility-administered medications on file prior to visit.     Social History  Substance Use Topics  . Smoking status: Never Smoker  . Smokeless tobacco: Never Used  . Alcohol use Yes     Comment: social    Review of Systems  Constitutional: Negative for chills, fever and unexpected weight change.  HENT: Negative for congestion.  Respiratory: Negative for cough.   Cardiovascular: Negative for chest pain, palpitations and leg swelling.  Gastrointestinal: Negative for nausea and vomiting.  Musculoskeletal: Negative for arthralgias and myalgias.  Skin: Negative for rash.  Neurological: Negative for headaches.  Hematological: Negative for adenopathy.  Psychiatric/Behavioral: Positive for sleep disturbance. Negative for confusion and suicidal ideas. The patient is nervous/anxious.       Objective:    BP 98/68   Pulse 64   Temp 98.5 F (36.9 C) (Oral)   Ht 5\' 6"  (1.676 m)   Wt 149 lb 9.6 oz (67.9 kg)   SpO2 98%   BMI 24.15 kg/m   BP Readings from Last 3 Encounters:  08/09/17 98/68  08/06/16 110/80   02/05/16 124/77   Wt Readings from Last 3 Encounters:  08/09/17 149 lb 9.6 oz (67.9 kg)  08/06/16 149 lb (67.6 kg)  02/05/16 142 lb 8 oz (64.6 kg)    Physical Exam  Constitutional: She appears well-developed and well-nourished.  Eyes: Conjunctivae are normal.  Neck: No thyroid mass and no thyromegaly present.  Cardiovascular: Normal rate, regular rhythm, normal heart sounds and normal pulses.   Pulmonary/Chest: Effort normal and breath sounds normal. She has no wheezes. She has no rhonchi. She has no rales.  Musculoskeletal:       Legs: Two approx 2cm  Nodules appreciated left thigh. Non fluctuant. Non tender. NO streaking, increased warmth  Lymphadenopathy:       Head (right side): No submental, no submandibular, no tonsillar, no preauricular, no posterior auricular and no occipital adenopathy present.       Head (left side): No submental, no submandibular, no tonsillar, no preauricular, no posterior auricular and no occipital adenopathy present.    She has no cervical adenopathy.  Neurological: She is alert.  Skin: Skin is warm and dry.  Psychiatric: She has a normal mood and affect. Her speech is normal and behavior is normal. Thought content normal.  Vitals reviewed.      Assessment & Plan:   Problem List Items Addressed This Visit      Cardiovascular and Mediastinum   HTN (hypertension)    Controlled. Tolerating medication well. Will continue for now. Advised if gets lower or she becomes lightheaded/dizzy, we should hold medication. Will follow.       Relevant Medications   nebivolol (BYSTOLIC) 5 MG tablet     Other   Anxiety    Controlled on current regimen. Based on risks of long term BZDs, patient was willing to try Zoloft for GAD. She will try to taper off of the xanax and use more PRN at night for sleep. Follow up 6-8 weeks.       Relevant Medications   sertraline (ZOLOFT) 50 MG tablet   ALPRAZolam (XANAX) 0.25 MG tablet   Routine general medical  examination at a health care facility - Primary    Patient declines breast exam and pelvic, Pap smear today.  Discussed with her that her last Pap smear 2016 did not test HPV, and perguidelines, co testing should be done every 5 years which would be due this year, 2018. Patient politely declined Pap smear today. Screening labs ordered. Mammogram ordered and patient understands to schedule.      Relevant Orders   CBC with Differential/Platelet   Comprehensive metabolic panel   Hemoglobin A1c   Lipid panel   TSH   VITAMIN D 25 Hydroxy (Vit-D Deficiency, Fractures)   MM SCREENING BREAST TOMO BILATERAL   Lipoma of extremity  Symptoms consistent with lipoma. Pending ultrasound soft tissue. Advised patient if were to became tender, grew in size or any other symptoms were to present, to let me know. Patient verbalized understanding      Relevant Orders   [place] Misc Soft Tissue       I have discontinued Ms. Aguilera's busPIRone. I am also having her start on sertraline. Additionally, I am having her maintain her ibuprofen, albuterol, VENTOLIN HFA, ADVAIR HFA, ALPRAZolam, and nebivolol.   Meds ordered this encounter  Medications  . sertraline (ZOLOFT) 50 MG tablet    Sig: Take 1 tablet (50 mg total) by mouth at bedtime.    Dispense:  90 tablet    Refill:  3    Order Specific Question:   Supervising Provider    Answer:   Deborra Medina L [[id number]]  . ALPRAZolam (XANAX) 0.25 MG tablet    Sig: Take 1 tablet (0.25 mg total) by mouth at bedtime as needed for anxiety or sleep.    Dispense:  30 tablet    Refill:  1    Order Specific Question:   Supervising Provider    Answer:   Deborra Medina L [[id number]]  . nebivolol (BYSTOLIC) 5 MG tablet    Sig: Take 0.5 tablets (2.5 mg total) by mouth at bedtime.    Dispense:  90 tablet    Refill:  1    Order Specific Question:   Supervising Provider    Answer:   Crecencio Mc [[id number]]    Return precautions given.   Risks, benefits, and alternatives of the  medications and treatment plan prescribed today were discussed, and patient expressed understanding.   Education regarding symptom management and diagnosis given to patient on AVS.   Continue to follow with Burnard Hawthorne, FNP for routine health maintenance.   Kemari D Osten and I agreed with plan.   Mable Paris, FNP

## 2017-08-09 NOTE — Patient Instructions (Signed)
Labs today  Trial zoloft Take 1/2 tablet of xanax with zoloft at bedtime as do not want you to be too drowsy The following week I would advise you take the Xanax half tablet every other night with  Zoloft. The third week  may take the Xanax half tablet every third night for one week. At this point may stop medication as long as you're feeling okay  Follow up 6-8 weeks  We placed a referral for mammogram this year. I asked that you call one the below locations and schedule this when it is convenient for you.   As discussed, I would like you to ask for 3D mammogram over the traditional 2D mammogram as new evidence suggest 3D is superior.   Please note that NOT all insurance companies cover 3D and you may have to pay a higher copay. You may call your insurance company to further clarify your benefits.   Options for [place], [place]  * [facility] [place], [place] * Note if you ask for 3D mammogram at this location, you must request [place], [place] location*   Health Maintenance, Female Adopting a healthy lifestyle and getting preventive care can go a long way to promote health and wellness. Talk with your health care provider about what schedule of regular examinations is right for you. This is a good chance for you to check in with your provider about disease prevention and staying healthy. In between checkups, there are plenty of things you can do on your own. Experts have done a lot of research about which lifestyle changes and preventive measures are most likely to keep you healthy. Ask your health care provider for more information. Weight and diet Eat a healthy diet  Be sure to include plenty of vegetables, fruits, low-fat dairy products, and lean protein.  Do not eat a lot of foods high in solid fats, added sugars,  or salt.  Get regular exercise. This is one of the most important things you can do for your health. ? Most adults should exercise for at least 150 minutes each week. The exercise should increase your heart rate and make you sweat (moderate-intensity exercise). ? Most adults should also do strengthening exercises at least twice a week. This is in addition to the moderate-intensity exercise.  Maintain a healthy weight  Body mass index (BMI) is a measurement that can be used to identify possible weight problems. It estimates body fat based on height and weight. Your health care provider can help determine your BMI and help you achieve or maintain a healthy weight.  For females 34 years of age and older: ? A BMI below 18.5 is considered underweight. ? A BMI of 18.5 to 24.9 is normal. ? A BMI of 25 to 29.9 is considered overweight. ? A BMI of 30 and above is considered obese.  Watch levels of cholesterol and blood lipids  You should start having your blood tested for lipids and cholesterol at 46 years of age, then have this test every 5 years.  You may need to have your cholesterol levels checked more often if: ? Your lipid or cholesterol levels are high. ? You are older than 46 years of age. ? You are at high risk for heart disease.  Cancer screening Lung Cancer  Lung cancer screening is recommended for adults 55-80  years old who are at high risk for lung cancer because of a history of smoking.  A yearly low-dose CT scan of the lungs is recommended for people who: ? Currently smoke. ? Have quit within the past 15 years. ? Have at least a 30-pack-year history of smoking. A pack year is smoking an average of one pack of cigarettes a day for 1 year.  Yearly screening should continue until it has been 15 years since you quit.  Yearly screening should stop if you develop a health problem that would prevent you from having lung cancer treatment.  Breast Cancer  Practice breast  self-awareness. This means understanding how your breasts normally appear and feel.  It also means doing regular breast self-exams. Let your health care provider know about any changes, no matter how small.  If you are in your 20s or 30s, you should have a clinical breast exam (CBE) by a health care provider every 1-3 years as part of a regular health exam.  If you are 38 or older, have a CBE every year. Also consider having a breast X-ray (mammogram) every year.  If you have a family history of breast cancer, talk to your health care provider about genetic screening.  If you are at high risk for breast cancer, talk to your health care provider about having an MRI and a mammogram every year.  Breast cancer gene (BRCA) assessment is recommended for women who have family members with BRCA-related cancers. BRCA-related cancers include: ? Breast. ? Ovarian. ? Tubal. ? Peritoneal cancers.  Results of the assessment will determine the need for genetic counseling and BRCA1 and BRCA2 testing.  Cervical Cancer Your health care provider may recommend that you be screened regularly for cancer of the pelvic organs (ovaries, uterus, and vagina). This screening involves a pelvic examination, including checking for microscopic changes to the surface of your cervix (Pap test). You may be encouraged to have this screening done every 3 years, beginning at age 44.  For women ages 58-65, health care providers may recommend pelvic exams and Pap testing every 3 years, or they may recommend the Pap and pelvic exam, combined with testing for human papilloma virus (HPV), every 5 years. Some types of HPV increase your risk of cervical cancer. Testing for HPV may also be done on women of any age with unclear Pap test results.  Other health care providers may not recommend any screening for nonpregnant women who are considered low risk for pelvic cancer and who do not have symptoms. Ask your health care provider if a  screening pelvic exam is right for you.  If you have had past treatment for cervical cancer or a condition that could lead to cancer, you need Pap tests and screening for cancer for at least 20 years after your treatment. If Pap tests have been discontinued, your risk factors (such as having a new sexual partner) need to be reassessed to determine if screening should resume. Some women have medical problems that increase the chance of getting cervical cancer. In these cases, your health care provider may recommend more frequent screening and Pap tests.  Colorectal Cancer  This type of cancer can be detected and often prevented.  Routine colorectal cancer screening usually begins at 46 years of age and continues through 46 years of age.  Your health care provider may recommend screening at an earlier age if you have risk factors for colon cancer.  Your health care provider may also recommend using home  test kits to check for hidden blood in the stool.  A small camera at the end of a tube can be used to examine your colon directly (sigmoidoscopy or colonoscopy). This is done to check for the earliest forms of colorectal cancer.  Routine screening usually begins at age 28.  Direct examination of the colon should be repeated every 5-10 years through 46 years of age. However, you may need to be screened more often if early forms of precancerous polyps or small growths are found.  Skin Cancer  Check your skin from head to toe regularly.  Tell your health care provider about any new moles or changes in moles, especially if there is a change in a mole's shape or color.  Also tell your health care provider if you have a mole that is larger than the size of a pencil eraser.  Always use sunscreen. Apply sunscreen liberally and repeatedly throughout the day.  Protect yourself by wearing long sleeves, pants, a wide-brimmed hat, and sunglasses whenever you are outside.  Heart disease, diabetes, and  high blood pressure  High blood pressure causes heart disease and increases the risk of stroke. High blood pressure is more likely to develop in: ? People who have blood pressure in the high end of the normal range (130-139/85-89 mm Hg). ? People who are overweight or obese. ? People who are African American.  If you are 51-74 years of age, have your blood pressure checked every 3-5 years. If you are 12 years of age or older, have your blood pressure checked every year. You should have your blood pressure measured twice-once when you are at a hospital or clinic, and once when you are not at a hospital or clinic. Record the average of the two measurements. To check your blood pressure when you are not at a hospital or clinic, you can use: ? An automated blood pressure machine at a pharmacy. ? A home blood pressure monitor.  If you are between 31 years and 82 years old, ask your health care provider if you should take aspirin to prevent strokes.  Have regular diabetes screenings. This involves taking a blood sample to check your fasting blood sugar level. ? If you are at a normal weight and have a low risk for diabetes, have this test once every three years after 46 years of age. ? If you are overweight and have a high risk for diabetes, consider being tested at a younger age or more often. Preventing infection Hepatitis B  If you have a higher risk for hepatitis B, you should be screened for this virus. You are considered at high risk for hepatitis B if: ? You were born in a country where hepatitis B is common. Ask your health care provider which countries are considered high risk. ? Your parents were born in a high-risk country, and you have not been immunized against hepatitis B (hepatitis B vaccine). ? You have HIV or AIDS. ? You use needles to inject street drugs. ? You live with someone who has hepatitis B. ? You have had sex with someone who has hepatitis B. ? You get hemodialysis  treatment. ? You take certain medicines for conditions, including cancer, organ transplantation, and autoimmune conditions.  Hepatitis C  Blood testing is recommended for: ? Everyone born from [birth year] through 1965. ? Anyone with known risk factors for hepatitis C.  Sexually transmitted infections (STIs)  You should be screened for sexually transmitted infections (STIs) including gonorrhea and chlamydia  if: ? You are sexually active and are younger than 46 years of age. ? You are older than 46 years of age and your health care provider tells you that you are at risk for this type of infection. ? Your sexual activity has changed since you were last screened and you are at an increased risk for chlamydia or gonorrhea. Ask your health care provider if you are at risk.  If you do not have HIV, but are at risk, it may be recommended that you take a prescription medicine daily to prevent HIV infection. This is called pre-exposure prophylaxis (PrEP). You are considered at risk if: ? You are sexually active and do not regularly use condoms or know the HIV status of your partner(s). ? You take drugs by injection. ? You are sexually active with a partner who has HIV.  Talk with your health care provider about whether you are at high risk of being infected with HIV. If you choose to begin PrEP, you should first be tested for HIV. You should then be tested every 3 months for as long as you are taking PrEP. Pregnancy  If you are premenopausal and you may become pregnant, ask your health care provider about preconception counseling.  If you may become pregnant, take 400 to 800 micrograms (mcg) of folic acid every day.  If you want to prevent pregnancy, talk to your health care provider about birth control (contraception). Osteoporosis and menopause  Osteoporosis is a disease in which the bones lose minerals and strength with aging. This can result in serious bone fractures. Your risk for osteoporosis  can be identified using a bone density scan.  If you are 11 years of age or older, or if you are at risk for osteoporosis and fractures, ask your health care provider if you should be screened.  Ask your health care provider whether you should take a calcium or vitamin D supplement to lower your risk for osteoporosis.  Menopause may have certain physical symptoms and risks.  Hormone replacement therapy may reduce some of these symptoms and risks. Talk to your health care provider about whether hormone replacement therapy is right for you. Follow these instructions at home:  Schedule regular health, dental, and eye exams.  Stay current with your immunizations.  Do not use any tobacco products including cigarettes, chewing tobacco, or electronic cigarettes.  If you are pregnant, do not drink alcohol.  If you are breastfeeding, limit how much and how often you drink alcohol.  Limit alcohol intake to no more than 1 drink per day for nonpregnant women. One drink equals 12 ounces of beer, 5 ounces of wine, or 1 ounces of hard liquor.  Do not use street drugs.  Do not share needles.  Ask your health care provider for help if you need support or information about quitting drugs.  Tell your health care provider if you often feel depressed.  Tell your health care provider if you have ever been abused or do not feel safe at home. This information is not intended to replace advice given to you by your health care provider. Make sure you discuss any questions you have with your health care provider. Document Released: 06/15/2011 Document Revised: 05/07/2016 Document Reviewed: 09/03/2015 Elsevier Interactive Patient Education  [employer].

## 2017-08-09 NOTE — Assessment & Plan Note (Signed)
Controlled on current regimen. Based on risks of long term BZDs, patient was willing to try Zoloft for GAD. She will try to taper off of the xanax and use more PRN at night for sleep. Follow up 6-8 weeks.

## 2017-08-09 NOTE — Assessment & Plan Note (Signed)
Symptoms consistent with lipoma. Pending ultrasound soft tissue. Advised patient if were to became tender, grew in size or any other symptoms were to present, to let me know. Patient verbalized understanding

## 2017-08-09 NOTE — Assessment & Plan Note (Signed)
Patient declines breast exam and pelvic, Pap smear today.  Discussed with her that her last Pap smear 2016 did not test HPV, and perguidelines, co testing should be done every 5 years which would be due this year, 2018. Patient politely declined Pap smear today. Screening labs ordered. Mammogram ordered and patient understands to schedule.

## 2017-08-09 NOTE — Assessment & Plan Note (Signed)
Controlled. Tolerating medication well. Will continue for now. Advised if gets lower or she becomes lightheaded/dizzy, we should hold medication. Will follow.

## 2017-08-10 ENCOUNTER — Encounter: Payer: Self-pay | Admitting: Family

## 2017-08-10 LAB — HEMOGLOBIN A1C
ESTIMATED AVERAGE GLUCOSE: 85 mg/dL
Hgb A1c MFr Bld: 4.6 % — ABNORMAL LOW (ref 4.8–5.6)

## 2017-08-10 LAB — CBC WITH DIFFERENTIAL/PLATELET
BASOS: 1 %
Basophils Absolute: 0.1 10*3/uL (ref 0.0–0.2)
EOS (ABSOLUTE): 0.3 10*3/uL (ref 0.0–0.4)
EOS: 5 %
HEMATOCRIT: 38 % (ref 34.0–46.6)
HEMOGLOBIN: 12.6 g/dL (ref 11.1–15.9)
IMMATURE GRANULOCYTES: 0 %
Immature Grans (Abs): 0 10*3/uL (ref 0.0–0.1)
Lymphocytes Absolute: 2.3 10*3/uL (ref 0.7–3.1)
Lymphs: 38 %
MCH: 31 pg (ref 26.6–33.0)
MCHC: 33.2 g/dL (ref 31.5–35.7)
MCV: 94 fL (ref 79–97)
MONOCYTES: 7 %
MONOS ABS: 0.4 10*3/uL (ref 0.1–0.9)
NEUTROS PCT: 49 %
Neutrophils Absolute: 3 10*3/uL (ref 1.4–7.0)
Platelets: 219 10*3/uL (ref 150–379)
RBC: 4.06 x10E6/uL (ref 3.77–5.28)
RDW: 13.8 % (ref 12.3–15.4)
WBC: 6 10*3/uL (ref 3.4–10.8)

## 2017-08-10 LAB — COMPREHENSIVE METABOLIC PANEL
A/G RATIO: 1.5 (ref 1.2–2.2)
ALBUMIN: 4.4 g/dL (ref 3.5–5.5)
ALT: 10 IU/L (ref 0–32)
AST: 15 IU/L (ref 0–40)
Alkaline Phosphatase: 63 IU/L (ref 39–117)
BUN/Creatinine Ratio: 16 (ref 9–23)
BUN: 13 mg/dL (ref 6–24)
Bilirubin Total: 0.4 mg/dL (ref 0.0–1.2)
CALCIUM: 9.9 mg/dL (ref 8.7–10.2)
CO2: 22 mmol/L (ref 20–29)
CREATININE: 0.82 mg/dL (ref 0.57–1.00)
Chloride: 104 mmol/L (ref 96–106)
GFR calc Af Amer: 99 mL/min/{1.73_m2} (ref >59)
GFR, EST NON AFRICAN AMERICAN: 86 mL/min/{1.73_m2} (ref >59)
GLOBULIN, TOTAL: 2.9 g/dL (ref 1.5–4.5)
Glucose: 83 mg/dL (ref 65–99)
Potassium: 4.4 mmol/L (ref 3.5–5.2)
SODIUM: 141 mmol/L (ref 134–144)
Total Protein: 7.3 g/dL (ref 6.0–8.5)

## 2017-08-10 LAB — LIPID PANEL
CHOLESTEROL TOTAL: 222 mg/dL — AB (ref 100–199)
Chol/HDL Ratio: 4.4 ratio (ref 0.0–4.4)
HDL: 50 mg/dL (ref >39)
LDL Calculated: 129 mg/dL — ABNORMAL HIGH (ref 0–99)
TRIGLYCERIDES: 213 mg/dL — AB (ref 0–149)
VLDL CHOLESTEROL CAL: 43 mg/dL — AB (ref 5–40)

## 2017-08-10 LAB — TSH: TSH: 4.34 u[IU]/mL (ref 0.450–4.500)

## 2017-08-10 LAB — VITAMIN D 25 HYDROXY (VIT D DEFICIENCY, FRACTURES): Vit D, 25-Hydroxy: 25.9 ng/mL — ABNORMAL LOW (ref 30.0–100.0)

## 2017-08-17 ENCOUNTER — Ambulatory Visit
Admission: RE | Admit: 2017-08-17 | Discharge: 2017-08-17 | Disposition: A | Discharge status: Home / Self Care | Payer: 59 | Source: Ambulatory Visit | Attending: Family | Admitting: Family

## 2017-08-17 DIAGNOSIS — D1720 Benign lipomatous neoplasm of skin and subcutaneous tissue of unspecified limb: Secondary | ICD-10-CM

## 2017-08-17 DIAGNOSIS — D1779 Benign lipomatous neoplasm of other sites: Secondary | ICD-10-CM | POA: Insufficient documentation

## 2017-09-13 NOTE — Telephone Encounter (Signed)
error 

## 2017-11-30 ENCOUNTER — Ambulatory Visit: Payer: Self-pay | Admitting: *Deleted

## 2017-11-30 NOTE — Telephone Encounter (Signed)
Patient scheduled with you for Thursday

## 2017-11-30 NOTE — Telephone Encounter (Signed)
Yes pt needs appt ASAP   Thanks [place]

## 2017-11-30 NOTE — Telephone Encounter (Signed)
Patient had changed her blood pressure medication at last visit. Patient has noticed recently that she had been having more frequent headaches - so she started checking her BP. Her reading vary from- 165/109 ( worst) to 148/95.  Patient has an appointment Thursday at 4 pm. Should her medication be adjusted? [phone]- pt cell number  Reason for Disposition . [7] Systolic BP  >= 034 OR Diastolic >= 90 AND [0] taking BP medications  Answer Assessment - Initial Assessment Questions 1. BLOOD PRESSURE: "What is the blood pressure?" "Did you take at least two measurements 5 minutes apart?"     Last night 148/95- patient is at work now 2. ONSET: "When did you take your blood pressure?"     Last night 3. HOW: "How did you obtain the blood pressure?" (e.g., visiting nurse, automatic home BP monitor)     Automatic cuff 4. HISTORY: "Do you have a history of high blood pressure?"     Yes- patient is on medication 5. MEDICATIONS: "Are you taking any medications for blood pressure?" "Have you missed any doses recently?"     Bystolic 5 mg- 1/2 tab at night 6. OTHER SYMPTOMS: "Do you have any symptoms?" (e.g., headache, chest pain, blurred vision, difficulty breathing, weakness)     Intermittent headache 7. PREGNANCY: "Is there any chance you are pregnant?" "When was your last menstrual period?"     LMP- 12/10  Vasectomy  Protocols used: HIGH BLOOD PRESSURE-A-AH

## 2017-12-02 ENCOUNTER — Ambulatory Visit: Payer: 59 | Admitting: Internal Medicine

## 2017-12-02 ENCOUNTER — Encounter: Payer: Self-pay | Admitting: Internal Medicine

## 2017-12-02 VITALS — BP 160/98 | HR 64 | Temp 98.0°F | Resp 16 | Ht 66.0 in | Wt 153.1 lb

## 2017-12-02 DIAGNOSIS — F419 Anxiety disorder, unspecified: Secondary | ICD-10-CM

## 2017-12-02 DIAGNOSIS — G4700 Insomnia, unspecified: Secondary | ICD-10-CM | POA: Diagnosis not present

## 2017-12-02 DIAGNOSIS — I10 Essential (primary) hypertension: Secondary | ICD-10-CM | POA: Diagnosis not present

## 2017-12-02 DIAGNOSIS — J069 Acute upper respiratory infection, unspecified: Secondary | ICD-10-CM

## 2017-12-02 MED ORDER — AMLODIPINE BESYLATE 5 MG PO TABS: 5.0000 mg | ORAL_TABLET | Freq: Every day | ORAL | 0 refills | Status: DC

## 2017-12-02 MED ORDER — ALPRAZOLAM 0.25 MG PO TABS: 0.2500 mg | ORAL_TABLET | Freq: Every day | ORAL | 1 refills | Status: DC | PRN

## 2017-12-02 MED ORDER — TRAZODONE HCL 50 MG PO TABS: 50.0000 mg | ORAL_TABLET | Freq: Every evening | ORAL | 1 refills | Status: DC | PRN

## 2017-12-02 NOTE — Progress Notes (Signed)
Chief Complaint  Patient presents with  . Follow-up   F/u  1. HTN uncontrolled she stopped Bystolic 5 mg since last visit and has been having h/as 2. Anxiety uncontrolled. She did not like the way Zoloft 50 made her feel with increased anxiety, reduced sleep, h/a and tremor. Xanax helped previously and she wants to try to take those prn  3. 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 No cough  4.c/o insomnia and unable to sleep throughout the night.  Has tried melatonin in the past and gave her abnormal dreams    Review of Systems  HENT: Positive for congestion.   Respiratory: Negative for cough.   Cardiovascular: Negative for chest pain.  Neurological: Positive for headaches.  Psychiatric/Behavioral: The patient is nervous/anxious and has insomnia.    Past Medical History:  Diagnosis Date  . Abnormal stress test    Dr. Mancel Parsons  . Anxiety   . Asthma    inhaler  . Chronic diarrhea    history  . Hypertension   . Migraines    otc med prn  . Seasonal allergies   . SVD (spontaneous vaginal delivery)    x 3   Past Surgical History:  Procedure Laterality Date  . HYSTEROSCOPY WITH NOVASURE N/A 12/12/2013   Procedure: HYSTEROSCOPY WITH NOVASURE;  Surgeon: Logan Bores, MD;  Location: [facility];  Service: Gynecology;  Laterality: N/A;   Family History  Problem Relation Age of Onset  . Hypertension Mother   . Heart disease Father   . Diabetes Father   . Stroke Father   . Heart disease Paternal Grandfather   . Breast cancer Maternal Aunt   . Colon cancer Maternal Aunt 80  . Breast cancer Paternal Grandmother   . Diabetes Other   . Melanoma Neg Hx    Social History   Socioeconomic History  . Marital status: Married    Spouse name: Not on file  . Number of children: 3  . Years of education: Not on file  . Highest education level: Not on file  Social Needs  . Financial resource strain: Not on file  . Food insecurity - worry: Not on  file  . Food insecurity - inability: Not on file  . Transportation needs - medical: Not on file  . Transportation needs - non-medical: Not on file  Occupational History  . 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 Smoking status: Never Smoker  . Smokeless tobacco: Never Used  Substance and Sexual Activity  . Alcohol use: Yes    Comment: social  . Drug use: No  . Sexual activity: Yes    Birth control/protection: None    Comment: husband vasectomy  Other Topics Concern  . Not on file  Social History Narrative   Lives in [place]. Works in [employer] at [employer].       3 children. 2 cats and dog.       Diet: regular.    Daily Caffeine Use:  1 sweet tea and 1 soda   Regular Exercise -  None right now; had a personal trainer in 2016. ; plans to start biking in Fall           Current Meds  Medication Sig  . ADVAIR HFA 115-21 MCG/ACT inhaler INHALE 2 PUFFS INTO THE LUNGS 2 (TWO) TIMES DAILY AS NEEDED.  Marland Kitchen albuterol (PROVENTIL HFA;VENTOLIN HFA) 108 (90 BASE) MCG/ACT inhaler Inhale 2  puffs into the lungs every 6 (six) hours as needed for wheezing or shortness of breath.  Marland Kitchen ibuprofen (ADVIL,MOTRIN) 200 MG tablet Take 400 mg by mouth daily as needed for cramping.  . VENTOLIN HFA 108 (90 BASE) MCG/ACT inhaler INHALE 2 PUFFS INTO THE LUNGS EVERY 6 (SIX) HOURS AS NEEDED.  . [DISCONTINUED] sertraline (ZOLOFT) 50 MG tablet Take 1 tablet (50 mg total) by mouth at bedtime.   Allergies  Allergen Reactions  . Amoxicillin-Pot Clavulanate     Passed out, has taken penicillin and amoxicillin w/no problems   No results found for this or any previous visit (from the past 2160 hour(s)). Objective  Body mass index is 24.72 kg/m. Wt Readings from Last 3 Encounters:  12/02/17 153 lb 2 oz (69.5 kg)  08/09/17 149 lb 9.6 oz (67.9 kg)  08/06/16 149 lb (67.6 kg)   Temp Readings from Last 3 Encounters:  12/02/17 98 F (36.7 C) (Oral)  08/09/17 98.5 F (36.9 C)  (Oral)  08/06/16 97.4 F (36.3 C) (Oral)   BP Readings from Last 3 Encounters:  12/02/17 (!) 160/98  08/09/17 98/68  08/06/16 110/80   Pulse Readings from Last 3 Encounters:  12/02/17 64  08/09/17 64  08/06/16 (!) 56   O2 saturation 98%  Physical Exam  Constitutional: She is oriented to person, place, and time and well-developed, well-nourished, and in no distress.  HENT:  Head: Normocephalic and atraumatic.  Mouth/Throat: Oropharynx is clear and moist and mucous membranes are normal.  No sinus ttp   Eyes: Conjunctivae are normal. Pupils are equal, round, and reactive to light.  Cardiovascular: Normal rate, regular rhythm and normal heart sounds.  Pulmonary/Chest: Effort normal and breath sounds normal.  Neurological: She is alert and oriented to person, place, and time. Gait normal. Gait normal.  Skin: Skin is warm, dry and intact.  Psychiatric: Mood, memory, affect and judgment normal.  Nursing note and vitals reviewed.   Assessment   1. HTN  2. Anxiety/insomnia  3. URI  4. HM Plan  1.  D/c bystolic 5  Add norvasc 5 mg 1/2 pill x 3 days then increase to 1 pill   2.  Taper off zoloft (see HPI side effects) Prn Xanax 0.25 mg qd prn short term  Try Trazodone 50 mg qhs prn sleep  3. Given cold handout  Call back 1 week if not better   4. Had flu shot  UTD tDap  Encouraged pt to resch mammo she wants to push back to 2019  Pap due 07/2018  Consider therapy for anxiety in future   Provider: Dr. Olivia Mackie McLean-Scocuzza-Internal Medicine

## 2017-12-02 NOTE — Patient Instructions (Addendum)
Follow up in 2-3 weeks sooner if needed  Try Trazodone for sleep as needed  Try Xanax on when needed  Cut Zoloft in 1/2 pill x 1 week then stop  Stop Bystolic  Add Norvasc 5 mg (1/2 pill) x 3 day then 1 pill daily  Reschedule mammogram  Think about therapy for anxiety Happy Holidays   Insomnia Insomnia is a sleep disorder that makes it difficult to fall asleep or to stay asleep. Insomnia can cause tiredness (fatigue), low energy, difficulty concentrating, mood swings, and poor performance at work or school. There are three different ways to classify insomnia:  Difficulty falling asleep.  Difficulty staying asleep.  Waking up too early in the morning.  Any type of insomnia can be long-term (chronic) or short-term (acute). Both are common. Short-term insomnia usually lasts for three months or less. Chronic insomnia occurs at least three times a week for longer than three months. What are the causes? Insomnia may be caused by another condition, situation, or substance, such as:  Anxiety.  Certain medicines.  Gastroesophageal reflux disease (GERD) or other gastrointestinal conditions.  Asthma or other breathing conditions.  Restless legs syndrome, sleep apnea, or other sleep disorders.  Chronic pain.  Menopause. This may include hot flashes.  Stroke.  Abuse of alcohol, tobacco, or illegal drugs.  Depression.  Caffeine.  Neurological disorders, such as Alzheimer disease.  An overactive thyroid (hyperthyroidism).  The cause of insomnia may not be known. What increases the risk? Risk factors for insomnia include:  Gender. Women are more commonly affected than men.  Age. Insomnia is more common as you get older.  Stress. This may involve your professional or personal life.  Income. Insomnia is more common in people with lower income.  Lack of exercise.  Irregular work schedule or night shifts.  Traveling between different time zones.  What are the signs or  symptoms? If you have insomnia, trouble falling asleep or trouble staying asleep is the main symptom. This may lead to other symptoms, such as:  Feeling fatigued.  Feeling nervous about going to sleep.  Not feeling rested in the morning.  Having trouble concentrating.  Feeling irritable, anxious, or depressed.  How is this treated? Treatment for insomnia depends on the cause. If your insomnia is caused by an underlying condition, treatment will focus on addressing the condition. Treatment may also include:  Medicines to help you sleep.  Counseling or therapy.  Lifestyle adjustments.  Follow these instructions at home:  Take medicines only as directed by your health care provider.  Keep regular sleeping and waking hours. Avoid naps.  Keep a sleep diary to help you and your health care provider figure out what could be causing your insomnia. Include: ? When you sleep. ? When you wake up during the night. ? How well you sleep. ? How rested you feel the next day. ? Any side effects of medicines you are taking. ? What you eat and drink.  Make your bedroom a comfortable place where it is easy to fall asleep: ? Put up shades or special blackout curtains to block light from outside. ? Use a white noise machine to block noise. ? Keep the temperature cool.  Exercise regularly as directed by your health care provider. Avoid exercising right before bedtime.  Use relaxation techniques to manage stress. Ask your health care provider to suggest some techniques that may work well for you. These may include: ? Breathing exercises. ? Routines to release muscle tension. ? Visualizing peaceful  scenes.  Cut back on alcohol, caffeinated beverages, and cigarettes, especially close to bedtime. These can disrupt your sleep.  Do not overeat or eat spicy foods right before bedtime. This can lead to digestive discomfort that can make it hard for you to sleep.  Limit screen use before bedtime.  This includes: ? Watching TV. ? Using your smartphone, tablet, and computer.  Stick to a routine. This can help you fall asleep faster. Try to do a quiet activity, brush your teeth, and go to bed at the same time each night.  Get out of bed if you are still awake after 15 minutes of trying to sleep. Keep the lights down, but try reading or doing a quiet activity. When you feel sleepy, go back to bed.  Make sure that you drive carefully. Avoid driving if you feel very sleepy.  Keep all follow-up appointments as directed by your health care provider. This is important. Contact a health care provider if:  You are tired throughout the day or have trouble in your daily routine due to sleepiness.  You continue to have sleep problems or your sleep problems get worse. Get help right away if:  You have serious thoughts about hurting yourself or someone else. This information is not intended to replace advice given to you by your health care provider. Make sure you discuss any questions you have with your health care provider. Document Released: 11/27/2000 Document Revised: 05/01/2016 Document Reviewed: 08/31/2014 Elsevier Interactive Patient Education  2018 [employer].  Generalized Anxiety Disorder, Adult Generalized anxiety disorder (GAD) is a mental health disorder. People with this condition constantly worry about everyday events. Unlike normal anxiety, worry related to GAD is not triggered by a specific event. These worries also do not fade or get better with time. GAD interferes with life functions, including relationships, work, and school. GAD can vary from mild to severe. People with severe GAD can have intense waves of anxiety with physical symptoms (panic attacks). What are the causes? The exact cause of GAD is not known. What increases the risk? This condition is more likely to develop in:  Women.  People who have a family history of anxiety disorders.  People who are very  shy.  People who experience very stressful life events, such as the death of a loved one.  People who have a very stressful family environment.  What are the signs or symptoms? People with GAD often worry excessively about many things in their lives, such as their health and family. They may also be overly concerned about:  Doing well at work.  Being on time.  Natural disasters.  Friendships.  Physical symptoms of GAD include:  Fatigue.  Muscle tension or having muscle twitches.  Trembling or feeling shaky.  Being easily startled.  Feeling like your heart is pounding or racing.  Feeling out of breath or like you cannot take a deep breath.  Having trouble falling asleep or staying asleep.  Sweating.  Nausea, diarrhea, or irritable bowel syndrome (IBS).  Headaches.  Trouble concentrating or remembering facts.  Restlessness.  Irritability.  How is this diagnosed? Your health care provider can diagnose GAD based on your symptoms and medical history. You will also have a physical exam. The health care provider will ask specific questions about your symptoms, including how severe they are, when they started, and if they come and go. Your health care provider may ask you about your use of alcohol or drugs, including prescription medicines. Your health care  provider may refer you to a mental health specialist for further evaluation. Your health care provider will do a thorough examination and may perform additional tests to rule out other possible causes of your symptoms. To be diagnosed with GAD, a person must have anxiety that:  Is out of his or her control.  Affects several different aspects of his or her life, such as work and relationships.  Causes distress that makes him or her unable to take part in normal activities.  Includes at least three physical symptoms of GAD, such as restlessness, fatigue, trouble concentrating, irritability, muscle tension, or sleep  problems.  Before your health care provider can confirm a diagnosis of GAD, these symptoms must be present more days than they are not, and they must last for six months or longer. How is this treated? The following therapies are usually used to treat GAD:  Medicine. Antidepressant medicine is usually prescribed for long-term daily control. Antianxiety medicines may be added in severe cases, especially when panic attacks occur.  Talk therapy (psychotherapy). Certain types of talk therapy can be helpful in treating GAD by providing support, education, and guidance. Options include: ? Cognitive behavioral therapy (CBT). People learn coping skills and techniques to ease their anxiety. They learn to identify unrealistic or negative thoughts and behaviors and to replace them with positive ones. ? Acceptance and commitment therapy (ACT). This treatment teaches people how to be mindful as a way to cope with unwanted thoughts and feelings. ? Biofeedback. This process trains you to manage your body's response (physiological response) through breathing techniques and relaxation methods. You will work with a therapist while machines are used to monitor your physical symptoms.  Stress management techniques. These include yoga, meditation, and exercise.  A mental health specialist can help determine which treatment is best for you. Some people see improvement with one type of therapy. However, other people require a combination of therapies. Follow these instructions at home:  Take over-the-counter and prescription medicines only as told by your health care provider.  Try to maintain a normal routine.  Try to anticipate stressful situations and allow extra time to manage them.  Practice any stress management or self-calming techniques as taught by your health care provider.  Do not punish yourself for setbacks or for not making progress.  Try to recognize your accomplishments, even if they are  small.  Keep all follow-up visits as told by your health care provider. This is important. Contact a health care provider if:  Your symptoms do not get better.  Your symptoms get worse.  You have signs of depression, such as: ? A persistently sad, cranky, or irritable mood. ? Loss of enjoyment in activities that used to bring you joy. ? Change in weight or eating. ? Changes in sleeping habits. ? Avoiding friends or family members. ? Loss of energy for normal tasks. ? Feelings of guilt or worthlessness. Get help right away if:  You have serious thoughts about hurting yourself or others. If you ever feel like you may hurt yourself or others, or have thoughts about taking your own life, get help right away. You can go to your nearest emergency department or call:  Your local emergency services (911 in the [place]  A suicide crisis helpline, such as the [place] at (630) 763-5524. This is open 24 hours a day.  Summary  Generalized anxiety disorder (GAD) is a mental health disorder that involves worry that is not triggered by a specific event.  People with GAD often worry excessively about many things in their lives, such as their health and family.  GAD may cause physical symptoms such as restlessness, trouble concentrating, sleep problems, frequent sweating, nausea, diarrhea, headaches, and trembling or muscle twitching.  A mental health specialist can help determine which treatment is best for you. Some people see improvement with one type of therapy. However, other people require a combination of therapies. This information is not intended to replace advice given to you by your health care provider. Make sure you discuss any questions you have with your health care provider. Document Released: 03/27/2013 Document Revised: 10/20/2016 Document Reviewed: 10/20/2016 Elsevier Interactive Patient Education  2018 [employer].  Upper Respiratory Infection,  Adult Most upper respiratory infections (URIs) are caused by a virus. A URI affects the nose, throat, and upper air passages. The most common type of URI is often called "the common cold." Follow these instructions at home:  Take medicines only as told by your doctor.  Gargle warm saltwater or take cough drops to comfort your throat as told by your doctor.  Use a warm mist humidifier or inhale steam from a shower to increase air moisture. This may make it easier to breathe.  Drink enough fluid to keep your pee (urine) clear or pale yellow.  Eat soups and other clear broths.  Have a healthy diet.  Rest as needed.  Go back to work when your fever is gone or your doctor says it is okay. ? You may need to stay home longer to avoid giving your URI to others. ? You can also wear a face mask and wash your hands often to prevent spread of the virus.  Use your inhaler more if you have asthma.  Do not use any tobacco products, including cigarettes, chewing tobacco, or electronic cigarettes. If you need help quitting, ask your doctor. Contact a doctor if:  You are getting worse, not better.  Your symptoms are not helped by medicine.  You have chills.  You are getting more short of breath.  You have brown or red mucus.  You have yellow or brown discharge from your nose.  You have pain in your face, especially when you bend forward.  You have a fever.  You have puffy (swollen) neck glands.  You have pain while swallowing.  You have white areas in the back of your throat. Get help right away if:  You have very bad or constant: ? Headache. ? Ear pain. ? Pain in your forehead, behind your eyes, and over your cheekbones (sinus pain). ? Chest pain.  You have long-lasting (chronic) lung disease and any of the following: ? Wheezing. ? Long-lasting cough. ? Coughing up blood. ? A change in your usual mucus.  You have a stiff neck.  You have changes in  your: ? Vision. ? Hearing. ? Thinking. ? Mood. This information is not intended to replace advice given to you by your health care provider. Make sure you discuss any questions you have with your health care provider. Document Released: 05/18/2008 Document Revised: 08/02/2016 Document Reviewed: 03/07/2014 Elsevier Interactive Patient Education  2018 [employer].

## 2017-12-20 ENCOUNTER — Ambulatory Visit: Payer: Managed Care, Other (non HMO) | Admitting: Internal Medicine

## 2017-12-20 ENCOUNTER — Encounter: Payer: Self-pay | Admitting: Internal Medicine

## 2017-12-20 VITALS — BP 130/84 | HR 72 | Temp 98.3°F | Ht 66.0 in | Wt 154.0 lb

## 2017-12-20 DIAGNOSIS — I10 Essential (primary) hypertension: Secondary | ICD-10-CM | POA: Diagnosis not present

## 2017-12-20 DIAGNOSIS — G4700 Insomnia, unspecified: Secondary | ICD-10-CM

## 2017-12-20 DIAGNOSIS — F419 Anxiety disorder, unspecified: Secondary | ICD-10-CM | POA: Diagnosis not present

## 2017-12-20 DIAGNOSIS — E559 Vitamin D deficiency, unspecified: Secondary | ICD-10-CM

## 2017-12-20 DIAGNOSIS — J4520 Mild intermittent asthma, uncomplicated: Secondary | ICD-10-CM | POA: Diagnosis not present

## 2017-12-20 DIAGNOSIS — E785 Hyperlipidemia, unspecified: Secondary | ICD-10-CM

## 2017-12-20 NOTE — Progress Notes (Signed)
Chief Complaint  Patient presents with  . Follow-up   F/u 1. HTN doing better on Norvasc 5 off bystolic and h/as resolved  2. Sleep better she still wakes up due to coughing with dry throat and will try humidifier. Trazadone is helping with sleep and she feels better since off Zoloft and less shaky      Review of Systems  Respiratory: Negative for shortness of breath.   Cardiovascular: Negative for chest pain.  Neurological: Negative for tremors and headaches.  Psychiatric/Behavioral: Negative for depression. The patient has insomnia.        Anxiety improved  Sleep improved    Past Medical History:  Diagnosis Date  . Abnormal stress test    Dr. Mancel [place]  . Anxiety   . Asthma    inhaler  . Chronic diarrhea    history  . Hyperlipidemia   . Hypertension   . Migraines    otc med prn  . Seasonal allergies   . SVD (spontaneous vaginal delivery)    x 3  . Vitamin D deficiency    Past Surgical History:  Procedure Laterality Date  . HYSTEROSCOPY WITH NOVASURE N/A 12/12/2013   Procedure: HYSTEROSCOPY WITH NOVASURE;  Surgeon: Briana Bores, MD;  Location: [facility];  Service: Gynecology;  Laterality: N/A;   Family History  Problem Relation Age of Onset  . Hypertension Mother   . Heart disease Father   . Diabetes Father   . Stroke Father   . Heart disease Paternal Grandfather   . Breast cancer Maternal Aunt   . Colon cancer Maternal Aunt 80  . Breast cancer Paternal Grandmother   . Diabetes Other   . Melanoma Neg Hx    Social History   Socioeconomic History  . Marital status: Married    Spouse name: Not on file  . Number of children: 3  . Years of education: Not on file  . Highest education level: Not on file  Social Needs  . Financial resource strain: Not on file  . Food insecurity - worry: Not on file  . Food insecurity - inability: Not on file  . Transportation needs - medical: Not on file  . Transportation needs - non-medical: Not on file  Occupational  History  . 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 Smoking status: Never Smoker  . Smokeless tobacco: Never Used  Substance and Sexual Activity  . Alcohol use: Yes    Comment: social  . Drug use: No  . Sexual activity: Yes    Birth control/protection: None    Comment: husband vasectomy  Other Topics Concern  . Not on file  Social History Narrative   Lives in [place]. Works in [employer] at [employer].       3 children. 2 cats and dog.       Diet: regular.    Daily Caffeine Use:  1 sweet tea and 1 soda   Regular Exercise -  None right now; had a personal trainer in 2016. ; plans to start biking in Fall           Current Meds  Medication Sig  . ADVAIR HFA 115-21 MCG/ACT inhaler INHALE 2 PUFFS INTO THE LUNGS 2 (TWO) TIMES DAILY AS NEEDED.  Marland Kitchen albuterol (PROVENTIL HFA;VENTOLIN HFA) 108 (90 BASE) MCG/ACT inhaler Inhale 2 puffs into the lungs every 6 (six) hours as needed for wheezing or shortness of breath.  . ALPRAZolam (XANAX) 0.25 MG tablet Take  1 tablet (0.25 mg total) by mouth daily as needed for anxiety or sleep.  Marland Kitchen amLODipine (NORVASC) 5 MG tablet Take 1 tablet (5 mg total) by mouth daily. Take 1/2 pill qd x 3 days then increase to 1 pill qam  . fexofenadine (ALLEGRA) 180 MG tablet Take 180 mg by mouth as needed for allergies or rhinitis.  Marland Kitchen ibuprofen (ADVIL,MOTRIN) 200 MG tablet Take 400 mg by mouth daily as needed for cramping.  . traZODone (DESYREL) 50 MG tablet Take 1 tablet (50 mg total) by mouth at bedtime as needed for sleep.  . VENTOLIN HFA 108 (90 BASE) MCG/ACT inhaler INHALE 2 PUFFS INTO THE LUNGS EVERY 6 (SIX) HOURS AS NEEDED.   Allergies  Allergen Reactions  . Amoxicillin-Pot Clavulanate     Passed out, has taken penicillin and amoxicillin w/no problems   No results found for this or any previous visit (from the past 2160 hour(s)). Objective  Body mass index is 24.86 kg/m. Wt Readings from Last 3 Encounters:  12/20/17 154  lb (69.9 kg)  12/02/17 153 lb 2 oz (69.5 kg)  08/09/17 149 lb 9.6 oz (67.9 kg)   Temp Readings from Last 3 Encounters:  12/20/17 98.3 F (36.8 C) (Oral)  12/02/17 98 F (36.7 C) (Oral)  08/09/17 98.5 F (36.9 C) (Oral)   BP Readings from Last 3 Encounters:  12/20/17 130/84  12/02/17 (!) 160/98  08/09/17 98/68   Pulse Readings from Last 3 Encounters:  12/20/17 72  12/02/17 64  08/09/17 64   O2 sat room air 98%  Physical Exam  Constitutional: She is oriented to person, place, and time and well-developed, well-nourished, and in no distress. Vital signs are normal.  HENT:  Head: Normocephalic and atraumatic.  Mouth/Throat: Oropharynx is clear and moist and mucous membranes are normal.  Eyes: Conjunctivae are normal. Pupils are equal, round, and reactive to light.  Cardiovascular: Normal rate, regular rhythm and normal heart sounds.  Pulmonary/Chest: Effort normal and breath sounds normal.  Abdominal: Soft. Bowel sounds are normal. There is no tenderness.  Neurological: She is alert and oriented to person, place, and time. Gait normal. Gait normal.  Skin: Skin is warm, dry and intact.  [place]ev[place] to trunk  Psychiatric: Mood, memory, affect and judgment normal.  Nursing note and vitals reviewed.   Assessment   1. HTN  2. Insomnia/anxiety 3. Vit D def 25.9  4. HM 5. HLD 6. Asthma controlled   Plan  1. Cont norvasc tolerating  2. Cont trazadone 50 mg qhs Prn Xanax but using sparingly 3. rec vit D3 2000 IU qd  4.  Had flu shot  Tdap UTD   Consider check hep B titer in future   Pt to resch mammogram  Pap 08/02/15 neg no HPV testing done will re do pap by 07/2018  She follows with dermatology Dr. Nicole [place] either June/July/August each year   5. Pt will try lifestyle changes  6. Prn Albuterol  Advair will check compliance with this in future 2 puffs bid rinse mouth called and left message for pt  Consider singulair in future with h/o allergies or trial if spiriva respimat      Provider: Dr. Olivia Mackie [place]-Internal Medicine

## 2017-12-20 NOTE — Patient Instructions (Signed)
Try vitamin D3 2000 IU daily  Try healthy diet and exercise  Reschedule mammogram please  Take care     Cholesterol Cholesterol is a white, waxy, fat-like substance that is needed by the human body in small amounts. The liver makes all the cholesterol we need. Cholesterol is carried from the liver by the blood through the blood vessels. Deposits of cholesterol (plaques) may build up on blood vessel (artery) walls. Plaques make the arteries narrower and stiffer. Cholesterol plaques increase the risk for heart attack and stroke. You cannot feel your cholesterol level even if it is very high. The only way to know that it is high is to have a blood test. Once you know your cholesterol levels, you should keep a record of the test results. Work with your health care provider to keep your levels in the desired range. What do the results mean?  Total cholesterol is a rough measure of all the cholesterol in your blood.  LDL (low-density lipoprotein) is the "bad" cholesterol. This is the type that causes plaque to build up on the artery walls. You want this level to be low.  HDL (high-density lipoprotein) is the "good" cholesterol because it cleans the arteries and carries the LDL away. You want this level to be high.  Triglycerides are fat that the body can either burn for energy or store. High levels are closely linked to heart disease. What are the desired levels of cholesterol?  Total cholesterol below 200.  LDL below 100 for people who are at risk, below 70 for people at very high risk.  HDL above 40 is good. A level of 60 or higher is considered to be protective against heart disease.  Triglycerides below 150. How can I lower my cholesterol? Diet Follow your diet program as told by your health care provider.  Choose fish or white meat chicken and [place], roasted or baked. Limit fatty cuts of red meat, fried foods, and processed meats, such as sausage and lunch meats.  Eat lots of fresh  fruits and vegetables.  Choose whole grains, beans, pasta, potatoes, and cereals.  Choose olive oil, corn oil, or canola oil, and use only small amounts.  Avoid butter, mayonnaise, shortening, or palm kernel oils.  Avoid foods with trans fats.  Drink skim or nonfat milk and eat low-fat or nonfat yogurt and cheeses. Avoid whole milk, cream, ice cream, egg yolks, and full-fat cheeses.  Healthier desserts include angel food cake, ginger snaps, animal crackers, hard candy, popsicles, and low-fat or nonfat frozen yogurt. Avoid pastries, cakes, pies, and cookies.  Exercise  Follow your exercise program as told by your health care provider. A regular program: ? Helps to decrease LDL and raise HDL. ? Helps with weight control.  Do things that increase your activity level, such as gardening, walking, and taking the stairs.  Ask your health care provider about ways that you can be more active in your daily life.  Medicine  Take over-the-counter and prescription medicines only as told by your health care provider. ? Medicine may be prescribed by your health care provider to help lower cholesterol and decrease the risk for heart disease. This is usually done if diet and exercise have failed to bring down cholesterol levels. ? If you have several risk factors, you may need medicine even if your levels are normal.  This information is not intended to replace advice given to you by your health care provider. Make sure you discuss any questions you have with  your health care provider. Document Released: 08/25/2001 Document Revised: 06/27/2016 Document Reviewed: 05/30/2016 Elsevier Interactive Patient Education  [employer].

## 2017-12-27 ENCOUNTER — Other Ambulatory Visit: Payer: Self-pay | Admitting: Internal Medicine

## 2017-12-27 DIAGNOSIS — I10 Essential (primary) hypertension: Secondary | ICD-10-CM

## 2017-12-27 DIAGNOSIS — G4700 Insomnia, unspecified: Secondary | ICD-10-CM

## 2017-12-27 MED ORDER — AMLODIPINE BESYLATE 5 MG PO TABS: 5.0000 mg | ORAL_TABLET | Freq: Every day | ORAL | 1 refills | Status: DC

## 2017-12-27 MED ORDER — TRAZODONE HCL 50 MG PO TABS: 50.0000 mg | ORAL_TABLET | Freq: Every evening | ORAL | 1 refills | Status: DC | PRN

## 2018-03-09 ENCOUNTER — Encounter: Payer: Self-pay | Admitting: Family

## 2018-03-23 ENCOUNTER — Telehealth: Payer: Self-pay | Admitting: Family

## 2018-03-23 NOTE — Telephone Encounter (Signed)
Unread myhart- please call or mail to pt  Ms [place], Briana [place] you are well.   Your mammogram appears due.   Please schedule a follow up appointment so we may order for you.   Look forward to seeing you.   Catalina Antigua, NP

## 2018-03-29 NOTE — Telephone Encounter (Signed)
Letter mailed

## 2018-12-04 ENCOUNTER — Other Ambulatory Visit: Payer: Self-pay

## 2018-12-04 ENCOUNTER — Ambulatory Visit
Admission: EM | Admit: 2018-12-04 | Discharge: 2018-12-04 | Disposition: A | Discharge status: Home / Self Care | Payer: Managed Care, Other (non HMO) | Attending: Family Medicine | Admitting: Family Medicine

## 2018-12-04 ENCOUNTER — Encounter: Payer: Self-pay | Admitting: Gynecology

## 2018-12-04 DIAGNOSIS — B9789 Other viral agents as the cause of diseases classified elsewhere: Secondary | ICD-10-CM | POA: Diagnosis not present

## 2018-12-04 DIAGNOSIS — J069 Acute upper respiratory infection, unspecified: Secondary | ICD-10-CM | POA: Diagnosis not present

## 2018-12-04 MED ORDER — ALBUTEROL SULFATE HFA 108 (90 BASE) MCG/ACT IN AERS: INHALATION_SPRAY | RESPIRATORY_TRACT | 0 refills | Status: DC

## 2018-12-04 MED ORDER — FLUTICASONE-SALMETEROL 115-21 MCG/ACT IN AERO: INHALATION_SPRAY | RESPIRATORY_TRACT | 0 refills | Status: DC

## 2018-12-04 MED ORDER — IPRATROPIUM BROMIDE 0.06 % NA SOLN: 2.0000 | Freq: Four times a day (QID) | NASAL | 0 refills | Status: DC | PRN

## 2018-12-04 NOTE — ED Provider Notes (Signed)
[facility]    CSN: [id number] Arrival date & time: 12/04/18  1243  History   Chief Complaint URI symptoms  HPI  47 year old female presents with respiratory symptoms.  Started yesterday.  She reports drainage and sore throat.  She reports associated difficulty breathing.  No fever.  No chills.  Patient states that she "feels like I am making excessive amounts saliva".  Patient states that she often has trouble in the fall/winter and uses Advair and albuterol.  She cannot find her inhalers.  No known exacerbating factors.  No other associated symptoms.  No other complaints.  PMH, Surgical Hx, Family Hx, Social History reviewed and updated as below.  Past Medical History:  Diagnosis Date  . Abnormal stress test    Dr. Mancel Parsons  . Anxiety   . Asthma    inhaler  . Chronic diarrhea    history  . Hyperlipidemia   . Hypertension   . Migraines    otc med prn  . Seasonal allergies   . SVD (spontaneous vaginal delivery)    x 3  . Vitamin D deficiency    Patient Active Problem List   Diagnosis Date Noted  . Insomnia 12/02/2017  . Lipoma of extremity 08/09/2017  . Vitamin D deficiency 08/08/2016  . Hyperlipidemia 08/08/2016  . Routine general medical examination at a health care facility 04/24/2013  . HTN (hypertension) 03/06/2013  . Seasonal allergies 03/06/2013  . IBS (irritable bowel syndrome) 11/25/2011  . Anxiety 11/25/2011  . Asthma in adult 10/24/2011   Past Surgical History:  Procedure Laterality Date  . HYSTEROSCOPY WITH NOVASURE N/A 12/12/2013   Procedure: HYSTEROSCOPY WITH NOVASURE;  Surgeon: Logan Bores, MD;  Location: [facility];  Service: Gynecology;  Laterality: N/A;   OB History   No obstetric history on file.    Home Medications    Prior to Admission medications   Medication Sig Start Date End Date Taking? Authorizing Provider  amLODipine (NORVASC) 5 MG tablet Take 1 tablet (5 mg total) by mouth daily. 12/27/17  Yes McLean-Scocuzza,  Nino Glow, MD  fexofenadine (ALLEGRA) 180 MG tablet Take 180 mg by mouth as needed for allergies or rhinitis.   Yes [provider]  ibuprofen (ADVIL,MOTRIN) 200 MG tablet Take 400 mg by mouth daily as needed for cramping.   Yes [provider]  albuterol (VENTOLIN HFA) 108 (90 Base) MCG/ACT inhaler INHALE 2 PUFFS INTO THE LUNGS EVERY 6 (SIX) HOURS AS NEEDED. 12/04/18   Thersa Salt G, DO  fluticasone-salmeterol (ADVAIR HFA) 115-21 MCG/ACT inhaler INHALE 2 PUFFS INTO THE LUNGS 2 (TWO) TIMES DAILY AS NEEDED. 12/04/18   Thersa Salt G, DO  ipratropium (ATROVENT) 0.06 % nasal spray Place 2 sprays into both nostrils 4 (four) times daily as needed for rhinitis. 12/04/18   Coral Spikes, DO  traZODone (DESYREL) 50 MG tablet Take 1 tablet (50 mg total) by mouth at bedtime as needed for sleep. 12/27/17   McLean-Scocuzza, Nino Glow, MD   Family History Family History  Problem Relation Age of Onset  . Hypertension Mother   . Heart disease Father   . Diabetes Father   . Stroke Father   . Heart disease Paternal Grandfather   . Breast cancer Maternal Aunt   . Colon cancer Maternal Aunt 80  . Breast cancer Paternal Grandmother   . Diabetes Other   . Melanoma Neg Hx    Social History Social History   Tobacco Use  . Smoking status: Never Smoker  .  Smokeless tobacco: Never Used  Substance Use Topics  . Alcohol use: Yes    Comment: social  . Drug use: No   Allergies   Amoxicillin-pot clavulanate   Review of Systems Review of Systems  Constitutional: Negative for fever.  HENT: Positive for postnasal drip and sore throat.   Respiratory: Positive for shortness of breath.    Physical Exam Triage Vital Signs ED Triage Vitals  Enc Vitals Group     BP 12/04/18 1305 (!) 149/97     Pulse Rate 12/04/18 1305 69     Resp 12/04/18 1305 16     Temp 12/04/18 1305 98.2 F (36.8 C)     Temp Source 12/04/18 1305 Oral     SpO2 12/04/18 1305 98 %     Weight 12/04/18 1303 150 lb (68 kg)      Height 12/04/18 1303 5\' 6"  (1.676 m)     Head Circumference --      Peak Flow --      Pain Score 12/04/18 1343 0     Pain Loc --      Pain Edu? --      Excl. in [place]? --    Updated Vital Signs BP (!) 149/97 (BP Location: Left Arm)   Pulse 69   Temp 98.2 F (36.8 C) (Oral)   Resp 16   Ht 5\' 6"  (1.676 m)   Wt 68 kg   LMP 11/21/2018   SpO2 98%   BMI 24.21 kg/m   Visual Acuity Right Eye Distance:   Left Eye Distance:   Bilateral Distance:    Right Eye Near:   Left Eye Near:    Bilateral Near:     Physical Exam Vitals signs and nursing note reviewed.  Constitutional:      General: She is not in acute distress.    Appearance: She is not ill-appearing or toxic-appearing.  HENT:     Head: Normocephalic and atraumatic.     Mouth/Throat:     Pharynx: Oropharynx is clear. No oropharyngeal exudate or posterior oropharyngeal erythema.  Cardiovascular:     Rate and Rhythm: Normal rate and regular rhythm.  Pulmonary:     Effort: Pulmonary effort is normal.     Breath sounds: No wheezing, rhonchi or rales.  Neurological:     Mental Status: She is alert.  Psychiatric:        Mood and Affect: Mood normal.        Behavior: Behavior normal.    UC Treatments / Results  Labs (all labs ordered are listed, but only abnormal results are displayed) Labs Reviewed - No data to display  EKG None  Radiology No results found.  Procedures Procedures (including critical care time)  Medications Ordered in UC Medications - No data to display  Initial Impression / Assessment and Plan / UC Course  I have reviewed the triage vital signs and the nursing notes.  Pertinent labs & imaging results that were available during my care of the patient were reviewed by me and considered in my medical decision making (see chart for details).    47 year old female presents with a viral URI.  Treating with Atrovent nasal spray.  I have refilled the patient's Advair and Ventolin.  Final  Clinical Impressions(s) / UC Diagnoses   Final diagnoses:  Viral URI     Discharge Instructions     Rest. Fluids.  Medications as prescribed.  Take care  Dr. Lacinda Axon    ED Prescriptions  Medication Sig Dispense Auth. Provider   albuterol (VENTOLIN HFA) 108 (90 Base) MCG/ACT inhaler INHALE 2 PUFFS INTO THE LUNGS EVERY 6 (SIX) HOURS AS NEEDED. 18 each Briana Klein G, DO   fluticasone-salmeterol (ADVAIR HFA) 115-21 MCG/ACT inhaler INHALE 2 PUFFS INTO THE LUNGS 2 (TWO) TIMES DAILY AS NEEDED. 12 g Jeni Duling G, DO   ipratropium (ATROVENT) 0.06 % nasal spray Place 2 sprays into both nostrils 4 (four) times daily as needed for rhinitis. 15 mL Coral Spikes, DO     Controlled Substance Prescriptions [place] Controlled Substance Registry consulted? Not Applicable   Coral Spikes, DO 12/04/18 1507

## 2018-12-04 NOTE — Discharge Instructions (Signed)
Rest. ° °Fluids. ° °Medications as prescribed. ° °Take care ° °Dr. Manisha Cancel  °

## 2018-12-04 NOTE — ED Triage Notes (Signed)
Per patient with history of asthma. Pt. Stated having problem with breathing and drainage x yesterday. Per patient unable to find her inhalers. Patient would like prescription for her Advair and albuterol and Ventolin.

## 2018-12-11 ENCOUNTER — Other Ambulatory Visit: Payer: Self-pay | Admitting: Family Medicine

## 2018-12-26 ENCOUNTER — Other Ambulatory Visit: Payer: Self-pay | Admitting: Family Medicine

## 2018-12-31 ENCOUNTER — Other Ambulatory Visit: Payer: Self-pay | Admitting: Family Medicine

## 2019-01-13 ENCOUNTER — Other Ambulatory Visit: Payer: Self-pay

## 2019-07-27 ENCOUNTER — Telehealth: Payer: Self-pay

## 2019-07-27 NOTE — Telephone Encounter (Signed)
Copied from [place] [id number]. Topic: Appointment Scheduling - Scheduling Inquiry for Clinic >> Jul 27, 2019  3:42 PM Yvette Rack wrote: Reason for CRM: Pt called to schedule an appt for medication refill. Attempted to transfer pt to the office but was asked to hold. Pt requests call back. Cb# [phone]

## 2019-09-01 ENCOUNTER — Ambulatory Visit: Payer: Managed Care, Other (non HMO) | Admitting: Family Medicine

## 2019-09-01 ENCOUNTER — Other Ambulatory Visit: Payer: Self-pay

## 2019-09-01 ENCOUNTER — Encounter: Payer: Self-pay | Admitting: Family Medicine

## 2019-09-01 VITALS — BP 128/82 | HR 71 | Temp 98.3°F | Resp 16 | Ht 66.0 in | Wt 152.2 lb

## 2019-09-01 DIAGNOSIS — R002 Palpitations: Secondary | ICD-10-CM | POA: Diagnosis not present

## 2019-09-01 DIAGNOSIS — F419 Anxiety disorder, unspecified: Secondary | ICD-10-CM

## 2019-09-01 DIAGNOSIS — R000 Tachycardia, unspecified: Secondary | ICD-10-CM

## 2019-09-01 DIAGNOSIS — I10 Essential (primary) hypertension: Secondary | ICD-10-CM | POA: Diagnosis not present

## 2019-09-01 DIAGNOSIS — G4700 Insomnia, unspecified: Secondary | ICD-10-CM

## 2019-09-01 MED ORDER — TRAZODONE HCL 50 MG PO TABS: 50.0000 mg | ORAL_TABLET | Freq: Every evening | ORAL | 1 refills | Status: DC | PRN

## 2019-09-01 MED ORDER — AMLODIPINE BESYLATE 5 MG PO TABS: 5.0000 mg | ORAL_TABLET | Freq: Every day | ORAL | 1 refills | Status: DC

## 2019-09-01 NOTE — Progress Notes (Signed)
Subjective:    Patient ID: Briana Klein, female    DOB: [birth year], 48 y.o.   MRN: [id number]  HPI   Patient presents to clinic due to feelings of palpitations; this is occurred off and on for the past couple of weeks.  Patient also did notice irregular elevated heart rate on her apple watch.  Patient has been on amlodipine for BP control for many years.  Had been on Bystolic previously, but this was discontinued due to lower BP readings.  Patient also has a history of anxiety, wonders if anxiety is contributing to feelings of palpitations.  Was on trazodone previously for help with shutting her mind off at night and believes she needs to restart this due to having racing thoughts especially before going to bed.  Patient Active Problem List   Diagnosis Date Noted  . Insomnia 12/02/2017  . Lipoma of extremity 08/09/2017  . Vitamin D deficiency 08/08/2016  . Hyperlipidemia 08/08/2016  . Routine general medical examination at a health care facility 04/24/2013  . HTN (hypertension) 03/06/2013  . Seasonal allergies 03/06/2013  . IBS (irritable bowel syndrome) 11/25/2011  . Anxiety 11/25/2011  . Asthma in adult 10/24/2011   Social History   Tobacco Use  . Smoking status: Never Smoker  . Smokeless tobacco: Never Used  Substance Use Topics  . Alcohol use: Yes    Comment: social   Review of Systems  Constitutional: Negative for chills, fatigue and fever.  HENT: Negative for congestion, ear pain, sinus pain and sore throat.   Eyes: Negative.   Respiratory: Negative for cough, shortness of breath and wheezing.   Cardiovascular: Negative for chest pain, and leg swelling. +palpitations Gastrointestinal: Negative for abdominal pain, diarrhea, nausea and vomiting.  Genitourinary: Negative for dysuria, frequency and urgency.  Musculoskeletal: Negative for arthralgias and myalgias.  Skin: Negative for color change, pallor and rash.  Neurological: Negative for syncope,  light-headedness and headaches.  Psychiatric/Behavioral: The patient is not nervous/anxious. No SI or HI.     Objective:   Physical Exam Constitutional:      General: She is not in acute distress.    Appearance: She is normal weight. She is not ill-appearing, toxic-appearing or diaphoretic.  HENT:     Head: Normocephalic and atraumatic.  Eyes:     General: No scleral icterus.    Extraocular Movements: Extraocular movements intact.     Conjunctiva/sclera: Conjunctivae normal.     Pupils: Pupils are equal, round, and reactive to light.  Neck:     Musculoskeletal: Normal range of motion and neck supple. No neck rigidity.     Vascular: No carotid bruit.  Cardiovascular:     Rate and Rhythm: Normal rate and regular rhythm.  Pulmonary:     Effort: Pulmonary effort is normal. No respiratory distress.     Breath sounds: Normal breath sounds.  Musculoskeletal:     Right lower leg: No edema.     Left lower leg: No edema.  Skin:    General: Skin is warm and dry.     Capillary Refill: Capillary refill takes less than 2 seconds.     Coloration: Skin is not jaundiced or pale.  Neurological:     General: No focal deficit present.     Mental Status: She is alert and oriented to person, place, and time.     Gait: Gait normal.  Psychiatric:        Mood and Affect: Mood normal.        Behavior:  Behavior normal.        Thought Content: Thought content normal.        Judgment: Judgment normal.       Today's Vitals   09/01/19 1148  BP: 128/82  Pulse: 71  Resp: 16  Temp: 98.3 F (36.8 C)  TempSrc: Oral  SpO2: 98%  Weight: 152 lb 3.2 oz (69 kg)  Height: 5' 6"  (1.676 m)   Body mass index is 24.57 kg/m.     Assessment & Plan:    1. Increased heart rate EKG performed in clinic and reviewed by me.  Does show a sinus bradycardia with heart rate 57, and some ST depression however this does not appear acute.  We will refer to cardiology for further evaluation increase heart rate,  feelings of palpitations and this EKG finding.  We will also get lab work to rule out any other possible causes of palpitations.  - EKG 12-Lead - CBC w/Diff - Iron, TIBC and Ferritin Panel - Thyroid Panel With TSH - B12 and Folate Panel - VITAMIN D 25 Hydroxy (Vit-D Deficiency, Fractures)  2. Essential hypertension  - amLODipine (NORVASC) 5 MG tablet; Take 1 tablet (5 mg total) by mouth daily.  Dispense: 90 tablet; Refill: 1 - CBC w/Diff - Thyroid Panel With TSH  3. Anxiety Will get lab work to rule out other causes. Will also re-start trazodone to help both mood and anxiety.  - Comp Met (CMET) - Thyroid Panel With TSH - B12 and Folate Panel - VITAMIN D 25 Hydroxy (Vit-D Deficiency, Fractures)  4. Palpitations  - Ambulatory referral to Cardiology  5. Insomnia, unspecified type  - traZODone (DESYREL) 50 MG tablet; Take 1 tablet (50 mg total) by mouth at bedtime as needed for sleep.  Dispense: 90 tablet; Refill: 1   Advised that she should hear about cardiology referral for next week.  We will plan to have patient follow-up for recheck in 4-6 weeks.

## 2019-09-01 NOTE — Patient Instructions (Signed)
Palpitations Palpitations are feelings that your heartbeat is not normal. Your heartbeat may feel like it is:  Uneven.  Faster than normal.  Fluttering.  Skipping a beat. This is usually not a serious problem. In some cases, you may need tests to rule out any serious problems. Follow these instructions at home: Pay attention to any changes in your condition. Take these actions to help manage your symptoms: Eating and drinking  Avoid: ? Coffee, tea, soft drinks, and energy drinks. ? Chocolate. ? Alcohol. ? Diet pills. Lifestyle   Try to lower your stress. These things can help you relax: ? Yoga. ? Deep breathing and meditation. ? Exercise. ? Using words and images to create positive thoughts (guided imagery). ? Using your mind to control things in your body (biofeedback).  Do not use drugs.  Get plenty of rest and sleep. Keep a regular bed time. General instructions   Take over-the-counter and prescription medicines only as told by your doctor.  Do not use any products that contain nicotine or tobacco, such as cigarettes and e-cigarettes. If you need help quitting, ask your doctor.  Keep all follow-up visits as told by your doctor. This is important. You may need more tests if palpitations do not go away or get worse. Contact a doctor if:  Your symptoms last more than 24 hours.  Your symptoms occur more often. Get help right away if you:  Have chest pain.  Feel short of breath.  Have a very bad headache.  Feel dizzy.  Pass out (faint). Summary  Palpitations are feelings that your heartbeat is uneven or faster than normal. It may feel like your heart is fluttering or skipping a beat.  Avoid food and drinks that may cause palpitations. These include caffeine, chocolate, and alcohol.  Try to lower your stress. Do not smoke or use drugs.  Get help right away if you faint or have chest pain, shortness of breath, a severe headache, or dizziness. This  information is not intended to replace advice given to you by your health care provider. Make sure you discuss any questions you have with your health care provider. Document Released: 09/08/2008 Document Revised: 01/12/2018 Document Reviewed: 01/12/2018 Elsevier Patient Education  2020 [employer] Inc.  

## 2019-09-02 LAB — COMPREHENSIVE METABOLIC PANEL
ALT: 16 IU/L (ref 0–32)
AST: 15 IU/L (ref 0–40)
Albumin/Globulin Ratio: 1.8 (ref 1.2–2.2)
Albumin: 4.8 g/dL (ref 3.8–4.8)
Alkaline Phosphatase: 81 IU/L (ref 39–117)
BUN/Creatinine Ratio: 16 (ref 9–23)
BUN: 10 mg/dL (ref 6–24)
Bilirubin Total: 0.7 mg/dL (ref 0.0–1.2)
CO2: 24 mmol/L (ref 20–29)
Calcium: 9.6 mg/dL (ref 8.7–10.2)
Chloride: 103 mmol/L (ref 96–106)
Creatinine, Ser: 0.62 mg/dL (ref 0.57–1.00)
GFR calc Af Amer: 123 mL/min/{1.73_m2} (ref >59)
GFR calc non Af Amer: 107 mL/min/{1.73_m2} (ref >59)
Globulin, Total: 2.6 g/dL (ref 1.5–4.5)
Glucose: 82 mg/dL (ref 65–99)
Potassium: 4 mmol/L (ref 3.5–5.2)
Sodium: 141 mmol/L (ref 134–144)
Total Protein: 7.4 g/dL (ref 6.0–8.5)

## 2019-09-02 LAB — IRON,TIBC AND FERRITIN PANEL
Ferritin: 116 ng/mL (ref 15–150)
Iron Saturation: 24 % (ref 15–55)
Iron: 76 ug/dL (ref 27–159)
Total Iron Binding Capacity: 320 ug/dL (ref 250–450)
UIBC: 244 ug/dL (ref 131–425)

## 2019-09-02 LAB — THYROID PANEL WITH TSH
Free Thyroxine Index: 2.1 (ref 1.2–4.9)
T3 Uptake Ratio: 27 % (ref 24–39)
T4, Total: 7.7 ug/dL (ref 4.5–12.0)
TSH: 3.56 u[IU]/mL (ref 0.450–4.500)

## 2019-09-02 LAB — CBC WITH DIFFERENTIAL/PLATELET
Basophils Absolute: 0.1 10*3/uL (ref 0.0–0.2)
Basos: 2 %
EOS (ABSOLUTE): 0.2 10*3/uL (ref 0.0–0.4)
Eos: 4 %
Hematocrit: 39.8 % (ref 34.0–46.6)
Hemoglobin: 13.7 g/dL (ref 11.1–15.9)
Immature Grans (Abs): 0 10*3/uL (ref 0.0–0.1)
Immature Granulocytes: 0 %
Lymphocytes Absolute: 1.9 10*3/uL (ref 0.7–3.1)
Lymphs: 39 %
MCH: 31.1 pg (ref 26.6–33.0)
MCHC: 34.4 g/dL (ref 31.5–35.7)
MCV: 91 fL (ref 79–97)
Monocytes Absolute: 0.4 10*3/uL (ref 0.1–0.9)
Monocytes: 8 %
Neutrophils Absolute: 2.4 10*3/uL (ref 1.4–7.0)
Neutrophils: 47 %
Platelets: 245 10*3/uL (ref 150–450)
RBC: 4.4 x10E6/uL (ref 3.77–5.28)
RDW: 13.5 % (ref 11.7–15.4)
WBC: 5 10*3/uL (ref 3.4–10.8)

## 2019-09-02 LAB — B12 AND FOLATE PANEL
Folate: 7.6 ng/mL (ref >3.0)
Vitamin B-12: 481 pg/mL (ref 232–1245)

## 2019-09-02 LAB — VITAMIN D 25 HYDROXY (VIT D DEFICIENCY, FRACTURES): Vit D, 25-Hydroxy: 17.3 ng/mL — ABNORMAL LOW (ref 30.0–100.0)

## 2019-09-05 ENCOUNTER — Encounter: Payer: Self-pay | Admitting: Family Medicine

## 2019-09-06 ENCOUNTER — Encounter: Payer: Self-pay | Admitting: Family Medicine

## 2019-09-06 ENCOUNTER — Telehealth: Payer: Self-pay | Admitting: Lab

## 2019-09-06 DIAGNOSIS — E559 Vitamin D deficiency, unspecified: Secondary | ICD-10-CM

## 2019-09-06 MED ORDER — VITAMIN D (ERGOCALCIFEROL) 1.25 MG (50000 UNIT) PO CAPS: 50000.0000 [IU] | ORAL_CAPSULE | ORAL | 0 refills | Status: DC

## 2019-09-06 NOTE — Telephone Encounter (Signed)
Please schedule 12 week lab follow up for vit D  Are the EKG results in mychart for patient to view? She is asking

## 2019-09-06 NOTE — Telephone Encounter (Signed)
Hi Briana Klein, You asked if I would prefer a daily dose of vitamin D or a weekly? If it's not a major difference between the two in regards to possible side affects then I'd prefer the weekly dose. Thank you for calling this in for me and your help in getting my levels to a more normal range. Briana Klein

## 2019-09-11 ENCOUNTER — Ambulatory Visit (INDEPENDENT_AMBULATORY_CARE_PROVIDER_SITE_OTHER): Payer: Managed Care, Other (non HMO)

## 2019-09-11 ENCOUNTER — Ambulatory Visit: Payer: Managed Care, Other (non HMO) | Admitting: Cardiology

## 2019-09-11 ENCOUNTER — Other Ambulatory Visit: Payer: Self-pay

## 2019-09-11 ENCOUNTER — Encounter: Payer: Self-pay | Admitting: Cardiology

## 2019-09-11 VITALS — BP 124/84 | HR 61 | Ht 66.0 in | Wt 152.5 lb

## 2019-09-11 DIAGNOSIS — R002 Palpitations: Secondary | ICD-10-CM

## 2019-09-11 NOTE — Patient Instructions (Signed)
Medication Instructions:  Your physician recommends that you continue on your current medications as directed. Please refer to the Current Medication list given to you today.  If you need a refill on your cardiac medications before your next appointment, please call your pharmacy.   Lab work: None ordered If you have labs (blood work) drawn today and your tests are completely normal, you will receive your results only by: Marland Kitchen MyChart Message (if you have MyChart) OR . A paper copy in the mail If you have any lab test that is abnormal or we need to change your treatment, we will call you to review the results.  Testing/Procedures: Your physician has requested that you have an echocardiogram. Echocardiography is a painless test that uses sound waves to create images of your heart. It provides your doctor with information about the size and shape of your heart and how well your heart's chambers and valves are working. This procedure takes approximately one hour. There are no restrictions for this procedure.    Follow-Up: At [facility], you and your health needs are our priority.  As part of our continuing mission to provide you with exceptional heart care, we have created designated Provider Care Teams.  These Care Teams include your primary Cardiologist (physician) and Advanced Practice Providers (APPs -  Physician Assistants and Nurse Practitioners) who all work together to provide you with the care you need, when you need it. You will need a follow up appointment in 6 weeks.  You may see Kate Sable, MD or one of the following Advanced Practice Providers on your designated Care Team:   Murray Hodgkins, NP Christell Faith, PA-C . Marrianne Mood, PA-C  Any Other Special Instructions Will Be Listed Below (If Applicable).  Your physician has recommended that you wear a Zio monitor. This monitor is a medical device that records the heart's electrical activity. Doctors most often use these  monitors to diagnose arrhythmias. Arrhythmias are problems with the speed or rhythm of the heartbeat. The monitor is a small device applied to your chest. You can wear one while you do your normal daily activities. While wearing this monitor if you have any symptoms to push the button and record what you felt. Once you have worn this monitor for the period of time provider prescribed (Usually 14 days), you will return the monitor device in the postage paid box. Once it is returned they will download the data collected and provide [place] with a report which the provider will then review and we will call you with those results. Important tips:  1. Avoid showering during the first 24 hours of wearing the monitor. 2. Avoid excessive sweating to help maximize wear time. 3. Do not submerge the device, no hot tubs, and no swimming pools. 4. Keep any lotions or oils away from the patch. 5. After 24 hours you may shower with the patch on. Take brief showers with your back facing the shower head.  6. Do not remove patch once it has been placed because that will interrupt data and decrease adhesive wear time. 7. Push the button when you have any symptoms and write down what you were feeling. 8. Once you have completed wearing your monitor, remove and place into box which has postage paid and place in your outgoing mailbox.  9. If for some reason you have misplaced your box then call our office and we can provide another box and/or mail it off for you.

## 2019-09-11 NOTE — Progress Notes (Signed)
Cardiology Office Note:    Date:  09/11/2019   ID:  Briana Klein, DOB Oct 29, 1971, MRN [id number]  PCP:  Burnard Hawthorne, FNP  Cardiologist:  Kate Sable, MD  Electrophysiologist:  None   Referring MD: Burnard Hawthorne, FNP   Chief Complaint  Patient presents with  . OTHER    Palpitations c/o elevated BP and sob . Meds reviewed verbally with pt.    History of Present Illness:    Briana Klein is a 48 y.o. female with a hx of anxiety, asthma who presents due to a 2-week episode of palpitations.  Patient states he she has had palpitations every now and then for most of all her life.  But over the past 2 weeks she has noted persistent symptoms of palpitations.  She has an apple watch which checks her heart rates, and noted to be as high as 150s.  She sometimes feels flushed when she has these symptoms.  Symptoms are not related with exertion and typically or call when she goes to sleep.  2 weeks ago upon Klein onset, symptoms lasted about 2 to 3 hours.  Symptoms of [place] every day for at least a couple of minutes.  She denies any history of heart disease.  She had chest tightness back in 2006 where work-up with a myocardial perfusion imaging was normal.  She then underwent a pulmonary function test and was diagnosed with asthma.  She originally thought her symptoms were secondary to caffeine.  She has stopped taking any caffeinated products but symptoms have persisted.  Past Medical History:  Diagnosis Date  . Abnormal stress test    Dr. Mancel Parsons  . Anxiety   . Asthma    inhaler  . Chronic diarrhea    history  . Hyperlipidemia   . Hypertension   . Migraines    otc med prn  . Seasonal allergies   . SVD (spontaneous vaginal delivery)    x 3  . Vitamin D deficiency     Past Surgical History:  Procedure Laterality Date  . HYSTEROSCOPY WITH NOVASURE N/A 12/12/2013   Procedure: HYSTEROSCOPY WITH NOVASURE;  Surgeon: Logan Bores, MD;  Location: [facility];   Service: Gynecology;  Laterality: N/A;    Current Medications: Current Meds  Medication Sig  . acetaminophen (TYLENOL) 325 MG tablet Take 650 mg by mouth every 6 (six) hours as needed.  Marland Kitchen albuterol (VENTOLIN HFA) 108 (90 Base) MCG/ACT inhaler INHALE 2 PUFFS BY MOUTH INTO THE LUNGS EVERY 6 HOURS AS NEEDED  . ALPRAZolam (XANAX) 0.25 MG tablet Take 0.25 mg by mouth at bedtime as needed for anxiety.  Marland Kitchen amLODipine (NORVASC) 5 MG tablet Take 1 tablet (5 mg total) by mouth daily.  Marland Kitchen aspirin-acetaminophen-caffeine (EXCEDRIN MIGRAINE) 250-250-65 MG tablet Take by mouth every 6 (six) hours as needed for headache.  . fexofenadine (ALLEGRA) 180 MG tablet Take 180 mg by mouth as needed for allergies or rhinitis.  . fluticasone-salmeterol (ADVAIR HFA) 115-21 MCG/ACT inhaler INHALE 2 PUFFS INTO THE LUNGS TWICE DAILY AS NEEDED  . traZODone (DESYREL) 50 MG tablet Take 1 tablet (50 mg total) by mouth at bedtime as needed for sleep.  . Vitamin D, Ergocalciferol, (DRISDOL) 1.25 MG (50000 UT) CAPS capsule Take 1 capsule (50,000 Units total) by mouth every 7 (seven) days.     Allergies:   Amoxicillin-pot clavulanate   Social History   Socioeconomic History  . Marital status: Married    Spouse name: Not on file  .  Number of children: 3  . Years of education: Not on file  . Highest education level: Not on file  Occupational History  . 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 Financial resource strain: Not on file  . Food insecurity    Worry: Not on file    Inability: Not on file  . Transportation needs    Medical: Not on file    Non-medical: Not on file  Tobacco Use  . Smoking status: Never Smoker  . Smokeless tobacco: Never Used  Substance and Sexual Activity  . Alcohol use: Yes    Comment: social  . Drug use: No  . Sexual activity: Yes    Birth control/protection: None    Comment: husband vasectomy  Lifestyle  . Physical activity    Days per week: Not on  file    Minutes per session: Not on file  . Stress: Not on file  Relationships  . Social Herbalist on phone: Not on file    Gets together: Not on file    Attends religious service: Not on file    Active member of club or organization: Not on file    Attends meetings of clubs or organizations: Not on file    Relationship status: Not on file  Other Topics Concern  . Not on file  Social History Narrative   Lives in [place]. Works in [employer] at [employer].       3 children. 2 cats and dog.       Diet: regular.    Daily Caffeine Use:  1 sweet tea and 1 soda   Regular Exercise -  None right now; had a personal trainer in 2016. ; plans to start biking in Fall             Family History: The patient's family history includes Breast cancer in her maternal aunt and paternal grandmother; Colon cancer (age of onset: 31) in her maternal aunt; Diabetes in her father and another family member; Heart disease in her father and paternal grandfather; Hypertension in her mother; Stroke in her father. There is no history of Melanoma.  ROS:   Please see the history of present illness.     All other systems reviewed and are negative.  EKGs/Labs/Other Studies Reviewed:    The following studies were reviewed today:   EKG:  EKG is  ordered today.  The ekg ordered today demonstrates normal sinus rhythm, nonspecific ST-T wave changes  Recent Labs: 09/01/2019: ALT 16; BUN 10; Creatinine, Ser 0.62; Hemoglobin 13.7; Platelets 245; Potassium 4.0; Sodium 141; TSH 3.560  Recent Lipid Panel    Component Value Date/Time   CHOL 222 (H) 08/09/2017 1559   TRIG 213 (H) 08/09/2017 1559   HDL 50 08/09/2017 1559   CHOLHDL 4.4 08/09/2017 1559   LDLCALC 129 (H) 08/09/2017 1559    Physical Exam:    VS:  BP 124/84 (BP Location: Right Arm, Patient Position: Sitting, Cuff Size: Normal)   Pulse 61   Ht 5\' 6"  (1.676 m)   Wt 152 lb 8 oz (69.2 kg)   SpO2 99%   BMI 24.61 kg/m     Wt Readings  from Last 3 Encounters:  09/11/19 152 lb 8 oz (69.2 kg)  09/01/19 152 lb 3.2 oz (69 kg)  12/04/18 150 lb (68 kg)     GEN:  Well nourished, well developed in no acute distress HEENT: Normal NECK: No JVD;  No carotid bruits LYMPHATICS: No lymphadenopathy CARDIAC: RRR, no murmurs, rubs, gallops RESPIRATORY:  Clear to auscultation without rales, wheezing or rhonchi  ABDOMEN: Soft, non-tender, non-distended MUSCULOSKELETAL:  No edema; No deformity  SKIN: Warm and dry NEUROLOGIC:  Alert and oriented x 3 PSYCHIATRIC:  Normal affect   ASSESSMENT:   Her symptoms with heart rates of 150s suggests supraventricular arrhythmia.  Could be sinus tach, SVT, atrial flutter or atrial fibrillation option in the differential. 1. Palpitations    PLAN:    In order of problems listed above:  1. Get a ZIO patch. 2. Get echocardiogram  Follow-up after ZIO Patch and echo.  Total encounter time more than 40 minutes  Greater than 50% was spent in counseling and coordination of care with the patient  Medication Adjustments/Labs and Tests Ordered: Current medicines are reviewed at length with the patient today.  Concerns regarding medicines are outlined above.  Orders Placed This Encounter  Procedures  . LONG TERM MONITOR (3-14 DAYS)  . EKG 12-Lead  . ECHOCARDIOGRAM COMPLETE   No orders of the defined types were placed in this encounter.   Patient Instructions  Medication Instructions:  Your physician recommends that you continue on your current medications as directed. Please refer to the Current Medication list given to you today.  If you need a refill on your cardiac medications before your next appointment, please call your pharmacy.   Lab work: None ordered If you have labs (blood work) drawn today and your tests are completely normal, you will receive your results only by: Marland Kitchen MyChart Message (if you have MyChart) OR . A paper copy in the mail If you have any lab test that is abnormal or  we need to change your treatment, we will call you to review the results.  Testing/Procedures: Your physician has requested that you have an echocardiogram. Echocardiography is a painless test that uses sound waves to create images of your heart. It provides your doctor with information about the size and shape of your heart and how well your heart's chambers and valves are working. This procedure takes approximately one hour. There are no restrictions for this procedure.    Follow-Up: At [facility], you and your health needs are our priority.  As part of our continuing mission to provide you with exceptional heart care, we have created designated Provider Care Teams.  These Care Teams include your primary Cardiologist (physician) and Advanced Practice Providers (APPs -  Physician Assistants and Nurse Practitioners) who all work together to provide you with the care you need, when you need it. You will need a follow up appointment in 6 weeks.  You may see Kate Sable, MD or one of the following Advanced Practice Providers on your designated Care Team:   Murray Hodgkins, NP Christell Faith, PA-C . Marrianne Mood, PA-C  Any Other Special Instructions Will Be Listed Below (If Applicable).  Your physician has recommended that you wear a Zio monitor. This monitor is a medical device that records the heart's electrical activity. Doctors most often use these monitors to diagnose arrhythmias. Arrhythmias are problems with the speed or rhythm of the heartbeat. The monitor is a small device applied to your chest. You can wear one while you do your normal daily activities. While wearing this monitor if you have any symptoms to push the button and record what you felt. Once you have worn this monitor for the period of time provider prescribed (Usually 14 days), you will return the monitor device  in the postage paid box. Once it is returned they will download the data collected and provide [place] with a  report which the provider will then review and we will call you with those results. Important tips:  1. Avoid showering during the Klein 24 hours of wearing the monitor. 2. Avoid excessive sweating to help maximize wear time. 3. Do not submerge the device, no hot tubs, and no swimming pools. 4. Keep any lotions or oils away from the patch. 5. After 24 hours you may shower with the patch on. Take brief showers with your back facing the shower head.  6. Do not remove patch once it has been placed because that will interrupt data and decrease adhesive wear time. 7. Push the button when you have any symptoms and write down what you were feeling. 8. Once you have completed wearing your monitor, remove and place into box which has postage paid and place in your outgoing mailbox.  9. If for some reason you have misplaced your box then call our office and we can provide another box and/or mail it off for you.             Signed, Kate Sable, MD  09/11/2019 4:27 PM    [place]

## 2019-09-11 NOTE — Telephone Encounter (Signed)
From what I can tell - it is released?  Not sure how else to help

## 2019-09-12 NOTE — Telephone Encounter (Signed)
The patient will need to call my chart [phone] to see why she can't view the EKG.

## 2019-10-13 ENCOUNTER — Telehealth: Payer: Self-pay | Admitting: *Deleted

## 2019-10-13 MED ORDER — APIXABAN 5 MG PO TABS: 5.0000 mg | ORAL_TABLET | Freq: Two times a day (BID) | ORAL | 2 refills | Status: DC

## 2019-10-13 NOTE — Telephone Encounter (Signed)
-----   Message from Kate Sable, MD sent at 10/13/2019  5:04 PM EDT ----- afib noted on monitor. Chadsvasc 2. Start eliquis 5mg  bid.  Get echo as scheduled. Keep follow up appointment with me  Thanks

## 2019-10-13 NOTE — Telephone Encounter (Signed)
No answer on mobile number. Left message to call back.  Called home number and no answer. Left message to call back as well.   Went ahead and sent this message via MyChart: Hi Ms. Briana Klein,  Here are your monitor results. Dr Garen Lah says atrial fibrillation was noted on the monitor. He recommends you start Eliquis 5 mg by mouth two times a day. This helps prevent the development of blood clots related to atrial fibrillation that could cause stroke and other cardiac events.   I tried calling you as well. I wanted to send the message this way as well since it was the weekend. I will go ahead and call the new prescription into your pharmacy.   Please let us know if you have any further questions. Please keep echo and follow up appointments as scheduled.  THanks, Danise Mina, RN  ----------------------------------------------------- Rx sent to pharmacy.

## 2019-10-17 NOTE — Telephone Encounter (Signed)
Spoke with patient. States she did receive the results of the monitor via MyChart. She has elected to not start the Eliquis until after speaking with Dr Mylo Red at her appointment on 10/23/2019 next week. She was appreciative of the call and update.

## 2019-10-19 ENCOUNTER — Other Ambulatory Visit: Payer: Managed Care, Other (non HMO)

## 2019-10-23 ENCOUNTER — Other Ambulatory Visit: Payer: Self-pay

## 2019-10-23 ENCOUNTER — Encounter: Payer: Self-pay | Admitting: Cardiology

## 2019-10-23 ENCOUNTER — Ambulatory Visit (INDEPENDENT_AMBULATORY_CARE_PROVIDER_SITE_OTHER): Payer: Managed Care, Other (non HMO) | Admitting: Cardiology

## 2019-10-23 VITALS — BP 110/80 | HR 59 | Temp 97.9°F | Ht 66.5 in | Wt 153.8 lb

## 2019-10-23 DIAGNOSIS — I10 Essential (primary) hypertension: Secondary | ICD-10-CM

## 2019-10-23 DIAGNOSIS — I480 Paroxysmal atrial fibrillation: Secondary | ICD-10-CM

## 2019-10-23 NOTE — Progress Notes (Signed)
Cardiology Office Note:    Date:  10/23/2019   ID:  Briana Klein, DOB 11-26-1971, MRN [id number]  PCP:  Burnard Hawthorne, FNP  Cardiologist:  Kate Sable, MD  Electrophysiologist:  None   Referring MD: Burnard Hawthorne, FNP   Chief Complaint  Patient presents with  . office visit    6 week F/U after echo and wearing ZIO; Reviewed meds verbally with patient.    History of Present Illness:    Briana Klein is a 48 y.o. female with a hx of anxiety, asthma who presents for follow-up.  She was originally seen due to a 2-week episode of persistent palpitations.    At the time, patient states she has had palpitations every now and then for most of all her life.  But over the previous 2 weeks she has noted persistent symptoms of palpitations.  She has an apple watch which checks her heart rates, and noted to be as high as 150s.  She sometimes feels flushed when she has these symptoms.  She denied any history of heart disease.  She had chest tightness back in 2006 where work-up with a myocardial perfusion imaging was normal.  She then underwent a pulmonary function test and was diagnosed with asthma.  She originally thought her symptoms were secondary to caffeine.  She has stopped taking any caffeinated products but symptoms persisted.  After last visit, a Zio patch was ordered and results showed episodes of A. fib ranging from 98 to 172 bpm.  Eliquis was recommended but patient wanted to see me during this follow-up visit prior to starting any new medications.  Past Medical History:  Diagnosis Date  . Abnormal stress test    Dr. Mancel Parsons  . Anxiety   . Asthma    inhaler  . Chronic diarrhea    history  . Hyperlipidemia   . Hypertension   . Migraines    otc med prn  . Seasonal allergies   . SVD (spontaneous vaginal delivery)    x 3  . Vitamin D deficiency     Past Surgical History:  Procedure Laterality Date  . HYSTEROSCOPY WITH NOVASURE N/A 12/12/2013   Procedure: HYSTEROSCOPY WITH NOVASURE;  Surgeon: Logan Bores, MD;  Location: [facility];  Service: Gynecology;  Laterality: N/A;    Current Medications: Current Meds  Medication Sig  . acetaminophen (TYLENOL) 325 MG tablet Take 650 mg by mouth every 6 (six) hours as needed.  Marland Kitchen albuterol (VENTOLIN HFA) 108 (90 Base) MCG/ACT inhaler INHALE 2 PUFFS BY MOUTH INTO THE LUNGS EVERY 6 HOURS AS NEEDED  . ALPRAZolam (XANAX) 0.25 MG tablet Take 0.25 mg by mouth at bedtime as needed for anxiety.  Marland Kitchen amLODipine (NORVASC) 5 MG tablet Take 1 tablet (5 mg total) by mouth daily.  Marland Kitchen aspirin-acetaminophen-caffeine (EXCEDRIN MIGRAINE) 250-250-65 MG tablet Take by mouth every 6 (six) hours as needed for headache.  . fexofenadine (ALLEGRA) 180 MG tablet Take 180 mg by mouth as needed for allergies or rhinitis.  . fluticasone-salmeterol (ADVAIR HFA) 115-21 MCG/ACT inhaler INHALE 2 PUFFS INTO THE LUNGS TWICE DAILY AS NEEDED  . traZODone (DESYREL) 50 MG tablet Take 1 tablet (50 mg total) by mouth at bedtime as needed for sleep.  . Vitamin D, Ergocalciferol, (DRISDOL) 1.25 MG (50000 UT) CAPS capsule Take 1 capsule (50,000 Units total) by mouth every 7 (seven) days.     Allergies:   Amoxicillin-pot clavulanate   Social History   Socioeconomic History  . Marital status:  Married    Spouse name: Not on file  . Number of children: 3  . Years of education: Not on file  . Highest education level: Not on file  Occupational History  . 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 Financial resource strain: Not on file  . Food insecurity    Worry: Not on file    Inability: Not on file  . Transportation needs    Medical: Not on file    Non-medical: Not on file  Tobacco Use  . Smoking status: Never Smoker  . Smokeless tobacco: Never Used  Substance and Sexual Activity  . Alcohol use: Yes    Comment: social  . Drug use: No  . Sexual activity: Yes    Birth control/protection: None     Comment: husband vasectomy  Lifestyle  . Physical activity    Days per week: Not on file    Minutes per session: Not on file  . Stress: Not on file  Relationships  . Social Herbalist on phone: Not on file    Gets together: Not on file    Attends religious service: Not on file    Active member of club or organization: Not on file    Attends meetings of clubs or organizations: Not on file    Relationship status: Not on file  Other Topics Concern  . Not on file  Social History Narrative   Lives in [place]. Works in [employer] at [employer].       3 children. 2 cats and dog.       Diet: regular.    Daily Caffeine Use:  1 sweet tea and 1 soda   Regular Exercise -  None right now; had a personal trainer in 2016. ; plans to start biking in Fall             Family History: The patient's family history includes Breast cancer in her maternal aunt and paternal grandmother; Colon cancer (age of onset: 93) in her maternal aunt; Diabetes in her father and another family member; Heart disease in her father and paternal grandfather; Hypertension in her mother; Stroke in her father. There is no history of Melanoma.  ROS:   Please see the history of present illness.     All other systems reviewed and are negative.  EKGs/Labs/Other Studies Reviewed:    The following studies were reviewed today:  2-week cardiac monitor date 10/13/2019 Patient had a min HR of 47 bpm, max HR of 172 bpm, and avg HR of 64 bpm. Predominant underlying rhythm was Sinus Rhythm. 17 Supraventricular Tachycardia runs occurred, the run with the fastest interval lasting 6 beats with a max rate of 158 bpm, the longest lasting 9 beats with an avg rate of 134 bpm. Atrial Fibrillation occurred (<1% burden), ranging from 98-172 bpm (avg of 129 bpm), the longest lasting 2 mins 22 secs with an avg rate of 139 bpm. Junctional Rhythm was present. Supraventricular Tachycardia, Atrial Fibrillation and Junctional  Rhythm were detected within +/- 45 seconds of symptomatic patient event(s). Isolated SVEs were rare (<1.0%), SVE Couplets were rare (<1.0%), and SVE Triplets were rare (<1.0%). Isolated VEs were rare (<1.0%), VE Couplets were rare (<1.0%), and no VE Triplets were present.  EKG:  EKG is  ordered today.  The ekg ordered today demonstrates sinus bradycardia, heart rate 59.  Recent Labs: 09/01/2019: ALT 16; BUN 10; Creatinine, Ser 0.62; Hemoglobin 13.7; Platelets  245; Potassium 4.0; Sodium 141; TSH 3.560  Recent Lipid Panel    Component Value Date/Time   CHOL 222 (H) 08/09/2017 1559   TRIG 213 (H) 08/09/2017 1559   HDL 50 08/09/2017 1559   CHOLHDL 4.4 08/09/2017 1559   LDLCALC 129 (H) 08/09/2017 1559    Physical Exam:    VS:  BP 110/80 (BP Location: Left Arm, Patient Position: Sitting, Cuff Size: Normal)   Pulse (!) 59   Temp 97.9 F (36.6 C)   Ht 5' 6.5" (1.689 m)   Wt 153 lb 12 oz (69.7 kg)   SpO2 99%   BMI 24.44 kg/m     Wt Readings from Last 3 Encounters:  10/23/19 153 lb 12 oz (69.7 kg)  09/11/19 152 lb 8 oz (69.2 kg)  09/01/19 152 lb 3.2 oz (69 kg)     GEN:  Well nourished, well developed in no acute distress HEENT: Normal NECK: No JVD; No carotid bruits LYMPHATICS: No lymphadenopathy CARDIAC: RRR, no murmurs, rubs, gallops RESPIRATORY:  Clear to auscultation without rales, wheezing or rhonchi  ABDOMEN: Soft, non-tender, non-distended MUSCULOSKELETAL:  No edema; No deformity  SKIN: Warm and dry NEUROLOGIC:  Alert and oriented x 3 PSYCHIATRIC:  Normal affect   ASSESSMENT:   2-week cardiac monitor showed atrial fibrillation with less than 1% burden.  Her CHA2DS2-VASc score is 2 (htn, female).  Her heart rate is currently 59, blood pressure 110/80.  Her 10-year ASCVD risk is 1.4.   Types of A. fib, management options explained to patient. 1. PAF (paroxysmal atrial fibrillation) ([place])   2. Essential hypertension    PLAN:    In order of problems listed above:   1. Avoiding beta-blockers for now due to low heart rate. start Eliquis 5 mg twice daily.  Get echocardiogram as scheduled. 2. Blood pressure well controlled.  Continue Norvasc 5 mg daily as currently prescribed.   Total encounter time more than 40 minutes  Greater than 50% was spent in counseling and coordination of care with the patient  Medication Adjustments/Labs and Tests Ordered: Current medicines are reviewed at length with the patient today.  Concerns regarding medicines are outlined above.  Orders Placed This Encounter  Procedures  . EKG 12-Lead  . ECHOCARDIOGRAM COMPLETE   No orders of the defined types were placed in this encounter.   Patient Instructions  Medication Instructions:  Your physician recommends that you continue on your current medications as directed. Please refer to the Current Medication list given to you today.  *If you need a refill on your cardiac medications before your next appointment, please call your pharmacy*  Lab Work: none If you have labs (blood work) drawn today and your tests are completely normal, you will receive your results only by: Marland Kitchen MyChart Message (if you have MyChart) OR . A paper copy in the mail If you have any lab test that is abnormal or we need to change your treatment, we will call you to review the results.  Testing/Procedures: Your physician has requested that you have an echocardiogram. Echocardiography is a painless test that uses sound waves to create images of your heart. It provides your doctor with information about the size and shape of your heart and how well your heart's chambers and valves are working. This procedure takes approximately one hour. There are no restrictions for this procedure. You may get an IV, if needed, to receive an ultrasound enhancing agent through to better visualize your heart.    Follow-Up: At [facility], you and your health needs are our priority.  As part of our continuing mission to  provide you with exceptional heart care, we have created designated Provider Care Teams.  These Care Teams include your primary Cardiologist (physician) and Advanced Practice Providers (APPs -  Physician Assistants and Nurse Practitioners) who all work together to provide you with the care you need, when you need it.  Your next appointment:   6 months  The format for your next appointment:   In Person  Provider:    You may see Kate Sable, MD or one of the following Advanced Practice Providers on your designated Care Team:    Murray Hodgkins, NP  Christell Faith, PA-C  Marrianne Mood, PA-C      Signed, Kate Sable, MD  10/23/2019 3:39 PM    [place]

## 2019-10-23 NOTE — Patient Instructions (Signed)
Medication Instructions:  Your physician recommends that you continue on your current medications as directed. Please refer to the Current Medication list given to you today.  *If you need a refill on your cardiac medications before your next appointment, please call your pharmacy*  Lab Work: none If you have labs (blood work) drawn today and your tests are completely normal, you will receive your results only by: Marland Kitchen MyChart Message (if you have MyChart) OR . A paper copy in the mail If you have any lab test that is abnormal or we need to change your treatment, we will call you to review the results.  Testing/Procedures: Your physician has requested that you have an echocardiogram. Echocardiography is a painless test that uses sound waves to create images of your heart. It provides your doctor with information about the size and shape of your heart and how well your heart's chambers and valves are working. This procedure takes approximately one hour. There are no restrictions for this procedure. You may get an IV, if needed, to receive an ultrasound enhancing agent through to better visualize your heart.    Follow-Up: At [facility], you and your health needs are our priority.  As part of our continuing mission to provide you with exceptional heart care, we have created designated Provider Care Teams.  These Care Teams include your primary Cardiologist (physician) and Advanced Practice Providers (APPs -  Physician Assistants and Nurse Practitioners) who all work together to provide you with the care you need, when you need it.  Your next appointment:   6 months  The format for your next appointment:   In Person  Provider:    You may see Kate Sable, MD or one of the following Advanced Practice Providers on your designated Care Team:    Murray Hodgkins, NP  Christell Faith, PA-C  Marrianne Mood, PA-C

## 2019-11-23 ENCOUNTER — Other Ambulatory Visit: Payer: Self-pay

## 2019-11-23 ENCOUNTER — Ambulatory Visit (INDEPENDENT_AMBULATORY_CARE_PROVIDER_SITE_OTHER): Payer: Managed Care, Other (non HMO)

## 2019-11-23 DIAGNOSIS — I480 Paroxysmal atrial fibrillation: Secondary | ICD-10-CM

## 2020-01-22 ENCOUNTER — Other Ambulatory Visit: Payer: Self-pay

## 2020-01-22 MED ORDER — APIXABAN 5 MG PO TABS: 5.0000 mg | ORAL_TABLET | Freq: Two times a day (BID) | ORAL | 6 refills | Status: DC

## 2020-01-27 ENCOUNTER — Ambulatory Visit: Payer: Managed Care, Other (non HMO)

## 2020-01-27 ENCOUNTER — Ambulatory Visit: Payer: Managed Care, Other (non HMO) | Attending: Internal Medicine

## 2020-01-27 DIAGNOSIS — Z23 Encounter for immunization: Secondary | ICD-10-CM | POA: Insufficient documentation

## 2020-01-27 NOTE — Progress Notes (Signed)
   Covid-19 Vaccination Clinic  Name:  Briana Klein    MRN: [id number] DOB: 07-28-1971  01/27/2020  [name] was observed post Covid-19 immunization for 30 minutes based on pre-vaccination screening without incidence. She was provided with Vaccine Information Sheet and instruction to access the V-Safe system.   [name] was instructed to call 911 with any severe reactions post vaccine: Marland Kitchen Difficulty breathing  . Swelling of your face and throat  . A fast heartbeat  . A bad rash all over your body  . Dizziness and weakness    Immunizations Administered    Name Date Dose VIS Date Route   Pfizer COVID-19 Vaccine 01/27/2020  2:53 PM 0.3 mL 11/24/2019 Intramuscular   Manufacturer: [place]   Lot: [id number]   [place]: [id number]

## 2020-02-19 ENCOUNTER — Ambulatory Visit: Payer: Managed Care, Other (non HMO) | Attending: Internal Medicine

## 2020-02-19 ENCOUNTER — Telehealth: Payer: Self-pay | Admitting: Family

## 2020-02-19 DIAGNOSIS — I10 Essential (primary) hypertension: Secondary | ICD-10-CM

## 2020-02-19 DIAGNOSIS — Z23 Encounter for immunization: Secondary | ICD-10-CM | POA: Insufficient documentation

## 2020-02-19 MED ORDER — AMLODIPINE BESYLATE 5 MG PO TABS: 5.0000 mg | ORAL_TABLET | Freq: Every day | ORAL | 1 refills | Status: DC

## 2020-02-19 NOTE — Progress Notes (Signed)
   Covid-19 Vaccination Clinic  Name:  Briana Klein    MRN: [id number] DOB: 1971-10-19  02/19/2020  [name] was observed post Covid-19 immunization for 15 minutes without incident. She was provided with Vaccine Information Sheet and instruction to access the V-Safe system.   [name] was instructed to call 911 with any severe reactions post vaccine: Marland Kitchen Difficulty breathing  . Swelling of face and throat  . A fast heartbeat  . A bad rash all over body  . Dizziness and weakness   Immunizations Administered    Name Date Dose VIS Date Route   Pfizer COVID-19 Vaccine 02/19/2020  3:47 PM 0.3 mL 11/24/2019 Intramuscular   Manufacturer: [place]   Lot: [id number]   [place]: [id number]

## 2020-02-19 NOTE — Telephone Encounter (Signed)
Pt needs a refill on amLODipine (NORVASC) 5 MG tablet and trazadone

## 2020-03-27 ENCOUNTER — Ambulatory Visit (INDEPENDENT_AMBULATORY_CARE_PROVIDER_SITE_OTHER): Payer: Managed Care, Other (non HMO) | Admitting: Family

## 2020-03-27 ENCOUNTER — Other Ambulatory Visit: Payer: Self-pay

## 2020-03-27 ENCOUNTER — Other Ambulatory Visit (HOSPITAL_COMMUNITY)
Admission: RE | Admit: 2020-03-27 | Discharge: 2020-03-27 | Disposition: A | Discharge status: Home / Self Care | Payer: Managed Care, Other (non HMO) | Source: Ambulatory Visit | Attending: Family | Admitting: Family

## 2020-03-27 ENCOUNTER — Encounter: Payer: Self-pay | Admitting: Family

## 2020-03-27 VITALS — BP 112/78 | HR 62 | Temp 97.4°F | Ht 66.03 in | Wt 154.2 lb

## 2020-03-27 DIAGNOSIS — J4520 Mild intermittent asthma, uncomplicated: Secondary | ICD-10-CM

## 2020-03-27 DIAGNOSIS — Z0000 Encounter for general adult medical examination without abnormal findings: Secondary | ICD-10-CM | POA: Diagnosis present

## 2020-03-27 DIAGNOSIS — I480 Paroxysmal atrial fibrillation: Secondary | ICD-10-CM | POA: Diagnosis not present

## 2020-03-27 DIAGNOSIS — Z23 Encounter for immunization: Secondary | ICD-10-CM | POA: Diagnosis not present

## 2020-03-27 DIAGNOSIS — I10 Essential (primary) hypertension: Secondary | ICD-10-CM

## 2020-03-27 DIAGNOSIS — G4700 Insomnia, unspecified: Secondary | ICD-10-CM

## 2020-03-27 MED ORDER — TRAZODONE HCL 50 MG PO TABS: 50.0000 mg | ORAL_TABLET | Freq: Every evening | ORAL | 1 refills | Status: DC | PRN

## 2020-03-27 NOTE — Assessment & Plan Note (Signed)
Clinical breast exam and Pap smear performed today.  Patient will schedule mammogram.  Referral for colonoscopy.

## 2020-03-27 NOTE — Patient Instructions (Signed)
Plenty of water and be careful with changing positions.  Referral gastroenterology; Let us know if you dont hear back within a week in regards to an appointment being scheduled.   Health Maintenance, Female Adopting a healthy lifestyle and getting preventive care are important in promoting health and wellness. Ask your health care provider about:  The right schedule for you to have regular tests and exams.  Things you can do on your own to prevent diseases and keep yourself healthy. What should I know about diet, weight, and exercise? Eat a healthy diet   Eat a diet that includes plenty of vegetables, fruits, low-fat dairy products, and lean protein.  Do not eat a lot of foods that are high in solid fats, added sugars, or sodium. Maintain a healthy weight Body mass index (BMI) is used to identify weight problems. It estimates body fat based on height and weight. Your health care provider can help determine your BMI and help you achieve or maintain a healthy weight. Get regular exercise Get regular exercise. This is one of the most important things you can do for your health. Most adults should:  Exercise for at least 150 minutes each week. The exercise should increase your heart rate and make you sweat (moderate-intensity exercise).  Do strengthening exercises at least twice a week. This is in addition to the moderate-intensity exercise.  Spend less time sitting. Even light physical activity can be beneficial. Watch cholesterol and blood lipids Have your blood tested for lipids and cholesterol at 49 years of age, then have this test every 5 years. Have your cholesterol levels checked more often if:  Your lipid or cholesterol levels are high.  You are older than 49 years of age.  You are at high risk for heart disease. What should I know about cancer screening? Depending on your health history and family history, you may need to have cancer screening at various ages. This may  include screening for:  Breast cancer.  Cervical cancer.  Colorectal cancer.  Skin cancer.  Lung cancer. What should I know about heart disease, diabetes, and high blood pressure? Blood pressure and heart disease  High blood pressure causes heart disease and increases the risk of stroke. This is more likely to develop in people who have high blood pressure readings, are of African descent, or are overweight.  Have your blood pressure checked: ? Every 3-5 years if you are 39-49 years of age. ? Every year if you are 15 years old or older. Diabetes Have regular diabetes screenings. This checks your fasting blood sugar level. Have the screening done:  Once every three years after age 75 if you are at a normal weight and have a low risk for diabetes.  More often and at a younger age if you are overweight or have a high risk for diabetes. What should I know about preventing infection? Hepatitis B If you have a higher risk for hepatitis B, you should be screened for this virus. Talk with your health care provider to find out if you are at risk for hepatitis B infection. Hepatitis C Testing is recommended for:  Everyone born from [birth year] through 1965.  Anyone with known risk factors for hepatitis C. Sexually transmitted infections (STIs)  Get screened for STIs, including gonorrhea and chlamydia, if: ? You are sexually active and are younger than 49 years of age. ? You are older than 49 years of age and your health care provider tells you that you are at risk  for this type of infection. ? Your sexual activity has changed since you were last screened, and you are at increased risk for chlamydia or gonorrhea. Ask your health care provider if you are at risk.  Ask your health care provider about whether you are at high risk for HIV. Your health care provider may recommend a prescription medicine to help prevent HIV infection. If you choose to take medicine to prevent HIV, you should first  get tested for HIV. You should then be tested every 3 months for as long as you are taking the medicine. Pregnancy  If you are about to stop having your period (premenopausal) and you may become pregnant, seek counseling before you get pregnant.  Take 400 to 800 micrograms (mcg) of folic acid every day if you become pregnant.  Ask for birth control (contraception) if you want to prevent pregnancy. Osteoporosis and menopause Osteoporosis is a disease in which the bones lose minerals and strength with aging. This can result in bone fractures. If you are 18 years old or older, or if you are at risk for osteoporosis and fractures, ask your health care provider if you should:  Be screened for bone loss.  Take a calcium or vitamin D supplement to lower your risk of fractures.  Be given hormone replacement therapy (HRT) to treat symptoms of menopause. Follow these instructions at home: Lifestyle  Do not use any products that contain nicotine or tobacco, such as cigarettes, e-cigarettes, and chewing tobacco. If you need help quitting, ask your health care provider.  Do not use street drugs.  Do not share needles.  Ask your health care provider for help if you need support or information about quitting drugs. Alcohol use  Do not drink alcohol if: ? Your health care provider tells you not to drink. ? You are pregnant, may be pregnant, or are planning to become pregnant.  If you drink alcohol: ? Limit how much you use to 0-1 drink a day. ? Limit intake if you are breastfeeding.  Be aware of how much alcohol is in your drink. In the [place] one drink equals one 12 oz bottle of beer (355 mL), one 5 oz glass of wine (148 mL), or one 1 oz glass of hard liquor (44 mL). General instructions  Schedule regular health, dental, and eye exams.  Stay current with your vaccines.  Tell your health care provider if: ? You often feel depressed. ? You have ever been abused or do not feel safe at  home. Summary  Adopting a healthy lifestyle and getting preventive care are important in promoting health and wellness.  Follow your health care provider's instructions about healthy diet, exercising, and getting tested or screened for diseases.  Follow your health care provider's instructions on monitoring your cholesterol and blood pressure. This information is not intended to replace advice given to you by your health care provider. Make sure you discuss any questions you have with your health care provider. Document Revised: 11/23/2018 Document Reviewed: 11/23/2018 Elsevier Patient Education  2020 [employer].

## 2020-03-27 NOTE — Assessment & Plan Note (Addendum)
Controlled. Patient is not orthostatic on exam today (see extended vitals).  Had a long discussion with patient regards to her blood pressure and heart rate in particular running on the low side which I suspect may be contributory occasional lightheaded feeling.  No syncopal episodes.  Counseled her on increased water intake, careful with changing positions.  Offered to de-escalate amlodipine however as she is not orthostatic, we did not make change that change at this time.  We will closely follow.

## 2020-03-27 NOTE — Progress Notes (Signed)
Subjective:    Patient ID: Briana Klein, female    DOB: September 29, 1971, 49 y.o.   MRN: [id number]  CC: Briana Klein is a 49 y.o. female who presents today for physical exam.    HPI: Atrial fib- compliant with eliquis. Palpitations resolved.  Occasional lightheaded since started medication. Notices more often in the morning. Drinking water.  Eating regularly. No syncope.  Denies exertional chest pain or pressure, numbness or tingling radiating to left arm or jaw,  frequent headaches, changes in vision, or shortness of breath.   Asthma- taking allegra for control. Not using advair unless needs medication.   Insomnia- doing well on trazodone.        Dr Charlestine Night- 10/2019 Atrial fibrillation- started on on eliquis; avoid Beta blockers. Continue CCB.   Colorectal Cancer Screening: No early family history.  Breast Cancer Screening: Mammogram due Cervical Cancer Screening: due        Tetanus - due         Labs: Screening labs today. Exercise: Limited exercise, walks dog most days .  Alcohol use: occasional Smoking/tobacco use: Nonsmoker.    HISTORY:  Past Medical History:  Diagnosis Date  . Abnormal stress test    Dr. Mancel Parsons  . Anxiety   . Asthma    inhaler  . Chronic diarrhea    history  . Hyperlipidemia   . Hypertension   . Migraines    otc med prn  . Seasonal allergies   . SVD (spontaneous vaginal delivery)    x 3  . Vitamin D deficiency     Past Surgical History:  Procedure Laterality Date  . HYSTEROSCOPY WITH NOVASURE N/A 12/12/2013   Procedure: HYSTEROSCOPY WITH NOVASURE;  Surgeon: Logan Bores, MD;  Location: [facility];  Service: Gynecology;  Laterality: N/A;   Family History  Problem Relation Age of Onset  . Hypertension Mother   . Heart disease Father   . Diabetes Father   . Stroke Father   . Heart disease Paternal Grandfather   . Breast cancer Maternal Aunt   . Colon cancer Maternal Aunt 80  . Breast cancer Paternal Grandmother   .  Diabetes Other   . Melanoma Neg Hx       ALLERGIES: Amoxicillin-pot clavulanate  Current Outpatient Medications on File Prior to Visit  Medication Sig Dispense Refill  . acetaminophen (TYLENOL) 325 MG tablet Take 650 mg by mouth every 6 (six) hours as needed.    Marland Kitchen albuterol (VENTOLIN HFA) 108 (90 Base) MCG/ACT inhaler INHALE 2 PUFFS BY MOUTH INTO THE LUNGS EVERY 6 HOURS AS NEEDED 18 g 3  . amLODipine (NORVASC) 5 MG tablet Take 1 tablet (5 mg total) by mouth daily. 90 tablet 1  . apixaban (ELIQUIS) 5 MG TABS tablet Take 1 tablet (5 mg total) by mouth 2 (two) times daily. Please see MyChart message 60 tablet 6  . aspirin-acetaminophen-caffeine (EXCEDRIN MIGRAINE) [id number] MG tablet Take by mouth every 6 (six) hours as needed for headache.    . fexofenadine (ALLEGRA) 180 MG tablet Take 180 mg by mouth as needed for allergies or rhinitis.    . fluticasone-salmeterol (ADVAIR HFA) 115-21 MCG/ACT inhaler INHALE 2 PUFFS INTO THE LUNGS TWICE DAILY AS NEEDED 12 g 0   No current facility-administered medications on file prior to visit.    Social History   Tobacco Use  . Smoking status: Never Smoker  . Smokeless tobacco: Never Used  Substance Use Topics  . Alcohol use: Yes  Comment: social  . Drug use: No    Review of Systems  Constitutional: Negative for chills, fever and unexpected weight change.  HENT: Negative for congestion.   Respiratory: Negative for cough.   Cardiovascular: Negative for chest pain, palpitations and leg swelling.  Gastrointestinal: Negative for nausea and vomiting.  Musculoskeletal: Negative for arthralgias and myalgias.  Skin: Negative for rash.  Neurological: Positive for dizziness. Negative for headaches.  Hematological: Negative for adenopathy.  Psychiatric/Behavioral: Negative for confusion, sleep disturbance and suicidal ideas.      Objective:    BP 112/78   Pulse 62   Temp (!) 97.4 F (36.3 C) (Temporal)   Ht 5' 6.03" (1.677 m)   Wt 154 lb 3.2  oz (69.9 kg)   SpO2 96%   BMI 24.87 kg/m   BP Readings from Last 3 Encounters:  03/27/20 112/78  10/23/19 110/80  09/11/19 124/84   Wt Readings from Last 3 Encounters:  03/27/20 154 lb 3.2 oz (69.9 kg)  10/23/19 153 lb 12 oz (69.7 kg)  09/11/19 152 lb 8 oz (69.2 kg)    Physical Exam Vitals reviewed.  Constitutional:      Appearance: She is well-developed.  Eyes:     Conjunctiva/sclera: Conjunctivae normal.  Neck:     Thyroid: No thyroid mass or thyromegaly.  Cardiovascular:     Rate and Rhythm: Normal rate and regular rhythm.     Pulses: Normal pulses.     Heart sounds: Normal heart sounds.  Pulmonary:     Effort: Pulmonary effort is normal.     Breath sounds: Normal breath sounds. No wheezing, rhonchi or rales.  Chest:     Breasts: Breasts are symmetrical.        Right: No inverted nipple, mass, nipple discharge, skin change or tenderness.        Left: No inverted nipple, mass, nipple discharge, skin change or tenderness.  Genitourinary:    Cervix: No cervical motion tenderness, discharge or friability.     Uterus: Not enlarged, not fixed and not tender.      Adnexa:        Right: No mass, tenderness or fullness.         Left: No mass, tenderness or fullness.       Comments: Pap performed. No CMT. Unable to appreciated ovaries. Lymphadenopathy:     Head:     Right side of head: No submental, submandibular, tonsillar, preauricular, posterior auricular or occipital adenopathy.     Left side of head: No submental, submandibular, tonsillar, preauricular, posterior auricular or occipital adenopathy.     Cervical:     Right cervical: No superficial, deep or posterior cervical adenopathy.    Left cervical: No superficial, deep or posterior cervical adenopathy.     Upper Body:     Right upper body: No pectoral adenopathy.     Left upper body: No pectoral adenopathy.  Skin:    General: Skin is warm and dry.  Neurological:     Mental Status: She is alert.  Psychiatric:         Speech: Speech normal.        Behavior: Behavior normal.        Thought Content: Thought content normal.        Assessment & Plan:   Problem List Items Addressed This Visit      Cardiovascular and Mediastinum   HTN (hypertension)    Controlled. Patient is not orthostatic on exam today (see extended vitals).  Had  a long discussion with patient regards to her blood pressure and heart rate in particular running on the low side which I suspect may be contributory occasional lightheaded feeling.  No syncopal episodes.  Counseled her on increased water intake, careful with changing positions.  Offered to de-escalate amlodipine however as she is not orthostatic, we did not make change that change at this time.  We will closely follow.      Paroxysmal atrial fibrillation (HCC)    Compliant with Eliquis.  She has follow-up scheduled with cardiology.  Will follow        Respiratory   Asthma in adult    Stable, continue current regimen        Other   Routine general medical examination at a health care facility - Primary    Clinical breast exam and Pap smear performed today.  Patient will schedule mammogram.  Referral for colonoscopy.      Relevant Orders   MM 3D SCREEN BREAST BILATERAL   Ambulatory referral to Gastroenterology   Cytology - PAP   CBC with Differential/Platelet   Comprehensive metabolic panel   Hemoglobin A1c   Lipid panel   TSH   VITAMIN D 25 Hydroxy (Vit-D Deficiency, Fractures)    Other Visit Diagnoses    Need for Tdap vaccination       Relevant Orders   Tdap vaccine greater than or equal to 7yo IM (Completed)       I have discontinued Trenell D. Amory's Vitamin D (Ergocalciferol) and ALPRAZolam. I am also having her maintain her fexofenadine, albuterol, fluticasone-salmeterol, acetaminophen, aspirin-acetaminophen-caffeine, apixaban, and amLODipine.   No orders of the defined types were placed in this encounter.   Return precautions given.    Risks, benefits, and alternatives of the medications and treatment plan prescribed today were discussed, and patient expressed understanding.   Education regarding symptom management and diagnosis given to patient on AVS.   Continue to follow with Burnard Hawthorne, FNP for routine health maintenance.   Kalika D Whitlatch and I agreed with plan.   Mable Paris, FNP

## 2020-03-27 NOTE — Assessment & Plan Note (Signed)
Stable, continue current regimen 

## 2020-03-27 NOTE — Assessment & Plan Note (Signed)
Compliant with Eliquis.  She has follow-up scheduled with cardiology.  Will follow

## 2020-03-28 LAB — COMPREHENSIVE METABOLIC PANEL
ALT: 26 IU/L (ref 0–32)
AST: 23 IU/L (ref 0–40)
Albumin/Globulin Ratio: 2 (ref 1.2–2.2)
Albumin: 4.9 g/dL — ABNORMAL HIGH (ref 3.8–4.8)
Alkaline Phosphatase: 83 IU/L (ref 39–117)
BUN/Creatinine Ratio: 16 (ref 9–23)
BUN: 11 mg/dL (ref 6–24)
Bilirubin Total: 0.6 mg/dL (ref 0.0–1.2)
CO2: 27 mmol/L (ref 20–29)
Calcium: 9.6 mg/dL (ref 8.7–10.2)
Chloride: 104 mmol/L (ref 96–106)
Creatinine, Ser: 0.69 mg/dL (ref 0.57–1.00)
GFR calc Af Amer: 119 mL/min/{1.73_m2} (ref >59)
GFR calc non Af Amer: 103 mL/min/{1.73_m2} (ref >59)
Globulin, Total: 2.4 g/dL (ref 1.5–4.5)
Glucose: 89 mg/dL (ref 65–99)
Potassium: 4.1 mmol/L (ref 3.5–5.2)
Sodium: 142 mmol/L (ref 134–144)
Total Protein: 7.3 g/dL (ref 6.0–8.5)

## 2020-03-28 LAB — CBC WITH DIFFERENTIAL/PLATELET
Basophils Absolute: 0.1 10*3/uL (ref 0.0–0.2)
Basos: 1 %
EOS (ABSOLUTE): 0.2 10*3/uL (ref 0.0–0.4)
Eos: 4 %
Hematocrit: 39.2 % (ref 34.0–46.6)
Hemoglobin: 13.6 g/dL (ref 11.1–15.9)
Immature Grans (Abs): 0 10*3/uL (ref 0.0–0.1)
Immature Granulocytes: 0 %
Lymphocytes Absolute: 1.9 10*3/uL (ref 0.7–3.1)
Lymphs: 34 %
MCH: 31.3 pg (ref 26.6–33.0)
MCHC: 34.7 g/dL (ref 31.5–35.7)
MCV: 90 fL (ref 79–97)
Monocytes Absolute: 0.4 10*3/uL (ref 0.1–0.9)
Monocytes: 7 %
Neutrophils Absolute: 3 10*3/uL (ref 1.4–7.0)
Neutrophils: 54 %
Platelets: 223 10*3/uL (ref 150–450)
RBC: 4.35 x10E6/uL (ref 3.77–5.28)
RDW: 13.6 % (ref 11.7–15.4)
WBC: 5.6 10*3/uL (ref 3.4–10.8)

## 2020-03-28 LAB — LIPID PANEL
Chol/HDL Ratio: 3.4 ratio (ref 0.0–4.4)
Cholesterol, Total: 217 mg/dL — ABNORMAL HIGH (ref 100–199)
HDL: 64 mg/dL (ref >39)
LDL Chol Calc (NIH): 139 mg/dL — ABNORMAL HIGH (ref 0–99)
Triglycerides: 79 mg/dL (ref 0–149)
VLDL Cholesterol Cal: 14 mg/dL (ref 5–40)

## 2020-03-28 LAB — HEMOGLOBIN A1C
Est. average glucose Bld gHb Est-mCnc: 88 mg/dL
Hgb A1c MFr Bld: 4.7 % — ABNORMAL LOW (ref 4.8–5.6)

## 2020-03-28 LAB — CYTOLOGY - PAP
Comment: NEGATIVE
Diagnosis: NEGATIVE
High risk HPV: NEGATIVE

## 2020-03-28 LAB — VITAMIN D 25 HYDROXY (VIT D DEFICIENCY, FRACTURES): Vit D, 25-Hydroxy: 22.9 ng/mL — ABNORMAL LOW (ref 30.0–100.0)

## 2020-03-28 LAB — TSH: TSH: 3.63 u[IU]/mL (ref 0.450–4.500)

## 2020-04-22 ENCOUNTER — Encounter: Payer: Self-pay | Admitting: Cardiology

## 2020-04-22 ENCOUNTER — Other Ambulatory Visit: Payer: Self-pay

## 2020-04-22 ENCOUNTER — Ambulatory Visit (INDEPENDENT_AMBULATORY_CARE_PROVIDER_SITE_OTHER): Payer: Managed Care, Other (non HMO) | Admitting: Cardiology

## 2020-04-22 VITALS — BP 112/70 | HR 62 | Ht 66.5 in | Wt 159.1 lb

## 2020-04-22 DIAGNOSIS — I480 Paroxysmal atrial fibrillation: Secondary | ICD-10-CM | POA: Diagnosis not present

## 2020-04-22 DIAGNOSIS — I10 Essential (primary) hypertension: Secondary | ICD-10-CM | POA: Diagnosis not present

## 2020-04-22 NOTE — Progress Notes (Signed)
Cardiology Office Note:    Date:  04/22/2020   ID:  Briana Klein, DOB April 01, 1971, MRN [id number]  PCP:  Burnard Hawthorne, FNP  Cardiologist:  Kate Sable, MD  Electrophysiologist:  None   Referring MD: Burnard Hawthorne, FNP   Chief Complaint  Patient presents with  . Other    6 month follow up. meds reviewed verbally with patient.     History of Present Illness:    Briana Klein is a 49 y.o. female with a hx of anxiety, asthma paroxysmal atrial fibrillation who presents for follow-up.  She was originally seen due to palpitations and cardiac monitor was placed which showed episodes of atrial fibrillation.  Less than 1% burden.  Was placed on Eliquis, baseline heart rate in the high 50s to low 60s, rate control agent was therefore avoided.  Patient has been feeling okay.  She has occasional palpitations which are not watery some or as severe as previous encounters.  She is concerned about taking Eliquis long-term.  Past Medical History:  Diagnosis Date  . Abnormal stress test    Dr. Mancel Parsons  . Anxiety   . Asthma    inhaler  . Chronic diarrhea    history  . Hyperlipidemia   . Hypertension   . Migraines    otc med prn  . Seasonal allergies   . SVD (spontaneous vaginal delivery)    x 3  . Vitamin D deficiency     Past Surgical History:  Procedure Laterality Date  . HYSTEROSCOPY WITH NOVASURE N/A 12/12/2013   Procedure: HYSTEROSCOPY WITH NOVASURE;  Surgeon: Logan Bores, MD;  Location: [facility];  Service: Gynecology;  Laterality: N/A;    Current Medications: Current Meds  Medication Sig  . acetaminophen (TYLENOL) 325 MG tablet Take 650 mg by mouth every 6 (six) hours as needed.  Marland Kitchen albuterol (VENTOLIN HFA) 108 (90 Base) MCG/ACT inhaler INHALE 2 PUFFS BY MOUTH INTO THE LUNGS EVERY 6 HOURS AS NEEDED  . amLODipine (NORVASC) 5 MG tablet Take 1 tablet (5 mg total) by mouth daily.  Marland Kitchen apixaban (ELIQUIS) 5 MG TABS tablet Take 1 tablet (5 mg total) by mouth 2  (two) times daily. Please see MyChart message  . aspirin-acetaminophen-caffeine (EXCEDRIN MIGRAINE) 250-250-65 MG tablet Take by mouth every 6 (six) hours as needed for headache.  . fexofenadine (ALLEGRA) 180 MG tablet Take 180 mg by mouth as needed for allergies or rhinitis.  . fluticasone-salmeterol (ADVAIR HFA) 115-21 MCG/ACT inhaler INHALE 2 PUFFS INTO THE LUNGS TWICE DAILY AS NEEDED  . traZODone (DESYREL) 50 MG tablet Take 1 tablet (50 mg total) by mouth at bedtime as needed for sleep.     Allergies:   Amoxicillin-pot clavulanate   Social History   Socioeconomic History  . Marital status: Married    Spouse name: Not on file  . Number of children: 3  . Years of education: Not on file  . Highest education level: Not on file  Occupational History  . 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 Smoking status: Never Smoker  . Smokeless tobacco: Never Used  Substance and Sexual Activity  . Alcohol use: Yes    Comment: social  . Drug use: No  . Sexual activity: Yes    Birth control/protection: None    Comment: husband vasectomy  Other Topics Concern  . Not on file  Social History Narrative   Lives in [place]. Works in [employer] at [employer].  3 children. 2 cats and dog.       Diet: regular.    Daily Caffeine Use:  1 sweet tea and 1 soda   Regular Exercise -  None right now; had a personal trainer in 2016. ; plans to start biking in Fall           Social Determinants of Health   Financial Resource Strain:   . Difficulty of Paying Living Expenses:   Food Insecurity:   . Worried About Charity fundraiser in the Last Year:   . Arboriculturist in the Last Year:   Transportation Needs:   . Film/video editor (Medical):   Marland Kitchen Lack of Transportation (Non-Medical):   Physical Activity:   . Days of Exercise per Week:   . Minutes of Exercise per Session:   Stress:   . Feeling of Stress :   Social Connections:   . Frequency of  Communication with Friends and Family:   . Frequency of Social Gatherings with Friends and Family:   . Attends Religious Services:   . Active Member of Clubs or Organizations:   . Attends Archivist Meetings:   Marland Kitchen Marital Status:      Family History: The patient's family history includes Breast cancer in her maternal aunt and paternal grandmother; Colon cancer (age of onset: 52) in her maternal aunt; Diabetes in her father and another family member; Heart disease in her father and paternal grandfather; Hypertension in her mother; Stroke in her father. There is no history of Melanoma.  ROS:   Please see the history of present illness.     All other systems reviewed and are negative.  EKGs/Labs/Other Studies Reviewed:    The following studies were reviewed today:  2-week cardiac monitor date 10/13/2019 Patient had a min HR of 47 bpm, max HR of 172 bpm, and avg HR of 64 bpm. Predominant underlying rhythm was Sinus Rhythm. 17 Supraventricular Tachycardia runs occurred, the run with the fastest interval lasting 6 beats with a max rate of 158 bpm, the longest lasting 9 beats with an avg rate of 134 bpm. Atrial Fibrillation occurred (<1% burden), ranging from 98-172 bpm (avg of 129 bpm), the longest lasting 2 mins 22 secs with an avg rate of 139 bpm. Junctional Rhythm was present. Supraventricular Tachycardia, Atrial Fibrillation and Junctional Rhythm were detected within +/- 45 seconds of symptomatic patient event(s). Isolated SVEs were rare (<1.0%), SVE Couplets were rare (<1.0%), and SVE Triplets were rare (<1.0%). Isolated VEs were rare (<1.0%), VE Couplets were rare (<1.0%), and no VE Triplets were present.  EKG:  EKG is  ordered today.  The ekg ordered today demonstrates sinus bradycardia, heart rate 59.  Recent Labs: 03/27/2020: ALT 26; BUN 11; Creatinine, Ser 0.69; Hemoglobin 13.6; Platelets 223; Potassium 4.1; Sodium 142; TSH 3.630  Recent Lipid Panel    Component  Value Date/Time   CHOL 217 (H) 03/27/2020 0949   TRIG 79 03/27/2020 0949   HDL 64 03/27/2020 0949   CHOLHDL 3.4 03/27/2020 0949   LDLCALC 139 (H) 03/27/2020 0949    Physical Exam:    VS:  BP 112/70 (BP Location: Left Arm, Patient Position: Sitting, Cuff Size: Normal)   Pulse 62   Ht 5' 6.5" (1.689 m)   Wt 159 lb 2 oz (72.2 kg)   SpO2 99%   BMI 25.30 kg/m     Wt Readings from Last 3 Encounters:  04/22/20 159 lb 2 oz (72.2 kg)  03/27/20  154 lb 3.2 oz (69.9 kg)  10/23/19 153 lb 12 oz (69.7 kg)     GEN:  Well nourished, well developed in no acute distress HEENT: Normal NECK: No JVD; No carotid bruits LYMPHATICS: No lymphadenopathy CARDIAC: RRR, no murmurs, rubs, gallops RESPIRATORY:  Clear to auscultation without rales, wheezing or rhonchi  ABDOMEN: Soft, non-tender, non-distended MUSCULOSKELETAL:  No edema; No deformity  SKIN: Warm and dry NEUROLOGIC:  Alert and oriented x 3 PSYCHIATRIC:  Normal affect   ASSESSMENT:    1. PAF (paroxysmal atrial fibrillation) ([place])   2. Essential hypertension    PLAN:    In order of problems listed above:  1. History of paroxysmal atrial fibrillation, CHA2DS2-VASc score of 2 (htn, female).  Holding off on rate control agents due to low normal heart rates.  Last echocardiogram shows normal systolic and diastolic function, EF 60 to 65%.  Continue Eliquis 5 mg twice daily.  We will have patient see EP regarding option of ablation so patient can be of anticoagulation.  Per my understanding, we typically perform ablations for symptomatic patients. 2. History of hypertension, blood pressure well controlled.  Continue Norvasc 5 mg.   Follow-up in 6 months.  Medication Adjustments/Labs and Tests Ordered: Current medicines are reviewed at length with the patient today.  Concerns regarding medicines are outlined above.  Orders Placed This Encounter  Procedures  . Ambulatory referral to Cardiac Electrophysiology  . EKG 12-Lead   No orders  of the defined types were placed in this encounter.   Patient Instructions  Medication Instructions:  Your physician recommends that you continue on your current medications as directed. Please refer to the Current Medication list given to you today.  *If you need a refill on your cardiac medications before your next appointment, please call your pharmacy*   Follow-Up: You have been referred to Electrophysiology with Dr Caryl Comes regarding aFib and longterm anticoagulation verses ablation.    At [facility], you and your health needs are our priority.  As part of our continuing mission to provide you with exceptional heart care, we have created designated Provider Care Teams.  These Care Teams include your primary Cardiologist (physician) and Advanced Practice Providers (APPs -  Physician Assistants and Nurse Practitioners) who all work together to provide you with the care you need, when you need it.  We recommend signing up for the patient portal called "MyChart".  Sign up information is provided on this After Visit Summary.  MyChart is used to connect with patients for Virtual Visits (Telemedicine).  Patients are able to view lab/test results, encounter notes, upcoming appointments, etc.  Non-urgent messages can be sent to your provider as well.   To learn more about what you can do with MyChart, go to [email or web address].    Your next appointment:   6 month(s)  The format for your next appointment:   In Person  Provider:   Kate Sable, MD      Signed, Kate Sable, MD  04/22/2020 4:49 PM    [place]

## 2020-04-22 NOTE — Patient Instructions (Signed)
Medication Instructions:  Your physician recommends that you continue on your current medications as directed. Please refer to the Current Medication list given to you today.  *If you need a refill on your cardiac medications before your next appointment, please call your pharmacy*   Follow-Up: You have been referred to Electrophysiology with Dr Caryl Comes regarding aFib and longterm anticoagulation verses ablation.    At [facility], you and your health needs are our priority.  As part of our continuing mission to provide you with exceptional heart care, we have created designated Provider Care Teams.  These Care Teams include your primary Cardiologist (physician) and Advanced Practice Providers (APPs -  Physician Assistants and Nurse Practitioners) who all work together to provide you with the care you need, when you need it.  We recommend signing up for the patient portal called "MyChart".  Sign up information is provided on this After Visit Summary.  MyChart is used to connect with patients for Virtual Visits (Telemedicine).  Patients are able to view lab/test results, encounter notes, upcoming appointments, etc.  Non-urgent messages can be sent to your provider as well.   To learn more about what you can do with MyChart, go to [email or web address].    Your next appointment:   6 month(s)  The format for your next appointment:   In Person  Provider:   Kate Sable, MD

## 2020-04-24 NOTE — Progress Notes (Signed)
ELECTROPHYSIOLOGY CONSULT NOTE  Patient ID: Briana NESSETH, MRN: [id number], DOB/AGE: 49-Feb-1972 49 y.o. Admit date: (Not on file) Date of Consult: 04/25/2020  Primary Physician: Burnard Hawthorne, FNP Primary Cardiologist: [facility]AE     Briana Klein is a 49 y.o. female who is being seen today for the evaluation of atrial fib at the request of [facility]AE.    HPI Briana Klein is a 49 y.o. female referred because of palpitations with documented atrial fibrillation on event recorder.  She has a longstanding history of palpitations.  She had episodes summer/fall 2020 which were more severe and "scary "then her prior episodes.  She was given an event recorder (personnally reviewed)  Afib symptomatic, 3 episodes, the longest 2.3 min; also symptoms with non sustained atrial tach/PVCs.  She was started on Eliquis.  She is concerned about whether she needs long-term lifelong anticoagulation.   She has sleep disordered breathing.  Her husband has observed her to be apneic.  No significant alcohol intake.  The patient denies chest pain, shortness of breath, nocturnal dyspnea, orthopnea or peripheral edema.  There have been no syncope.    Lightheadedness with hot showers, abrupt standing.  Poor heat tolerance.  Diet is fluid deplete and somewhat salt deplete with her history of hypertension  DATE TEST EF   12/20 Echo   60 %         Date Cr K TSH Hgb   4/21 0.69 4.1 3.63 13.6             Thromboembolic risk factors(, HTN-1, Gender-1) for a CHADSVASc Score of 2   Past Medical History:  Diagnosis Date  . Abnormal stress test    Dr. Mancel Parsons  . Anxiety   . Asthma    inhaler  . Chronic diarrhea    history  . Hyperlipidemia   . Hypertension   . Migraines    otc med prn  . Seasonal allergies   . SVD (spontaneous vaginal delivery)    x 3  . Vitamin D deficiency       Surgical History:  Past Surgical History:  Procedure Laterality Date  . HYSTEROSCOPY WITH NOVASURE N/A  12/12/2013   Procedure: HYSTEROSCOPY WITH NOVASURE;  Surgeon: Logan Bores, MD;  Location: [facility];  Service: Gynecology;  Laterality: N/A;     Home Meds: Current Meds  Medication Sig  . acetaminophen (TYLENOL) 325 MG tablet Take 650 mg by mouth every 6 (six) hours as needed.  Marland Kitchen albuterol (VENTOLIN HFA) 108 (90 Base) MCG/ACT inhaler INHALE 2 PUFFS BY MOUTH INTO THE LUNGS EVERY 6 HOURS AS NEEDED  . amLODipine (NORVASC) 5 MG tablet Take 1 tablet (5 mg total) by mouth daily.  Marland Kitchen aspirin-acetaminophen-caffeine (EXCEDRIN MIGRAINE) 250-250-65 MG tablet Take by mouth every 6 (six) hours as needed for headache.  . fexofenadine (ALLEGRA) 180 MG tablet Take 180 mg by mouth as needed for allergies or rhinitis.  . fluticasone-salmeterol (ADVAIR HFA) 115-21 MCG/ACT inhaler INHALE 2 PUFFS INTO THE LUNGS TWICE DAILY AS NEEDED  . traZODone (DESYREL) 50 MG tablet Take 1 tablet (50 mg total) by mouth at bedtime as needed for sleep.  . [DISCONTINUED] apixaban (ELIQUIS) 5 MG TABS tablet Take 1 tablet (5 mg total) by mouth 2 (two) times daily. Please see MyChart message    Allergies:  Allergies  Allergen Reactions  . Amoxicillin-Pot Clavulanate     Passed out, has taken penicillin and amoxicillin w/no problems    Social History  Socioeconomic History  . Marital status: Married    Spouse name: Not on file  . Number of children: 3  . Years of education: Not on file  . Highest education level: Not on file  Occupational History  . 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 Smoking status: Never Smoker  . Smokeless tobacco: Never Used  Substance and Sexual Activity  . Alcohol use: Yes    Comment: social  . Drug use: No  . Sexual activity: Yes    Birth control/protection: None    Comment: husband vasectomy  Other Topics Concern  . Not on file  Social History Narrative   Lives in [place]. Works in [employer] at [employer].       3 children. 2 cats and dog.        Diet: regular.    Daily Caffeine Use:  1 sweet tea and 1 soda   Regular Exercise -  None right now; had a personal trainer in 2016. ; plans to start biking in Fall           Social Determinants of Health   Financial Resource Strain:   . Difficulty of Paying Living Expenses:   Food Insecurity:   . Worried About Charity fundraiser in the Last Year:   . Arboriculturist in the Last Year:   Transportation Needs:   . Film/video editor (Medical):   Marland Kitchen Lack of Transportation (Non-Medical):   Physical Activity:   . Days of Exercise per Week:   . Minutes of Exercise per Session:   Stress:   . Feeling of Stress :   Social Connections:   . Frequency of Communication with Friends and Family:   . Frequency of Social Gatherings with Friends and Family:   . Attends Religious Services:   . Active Member of Clubs or Organizations:   . Attends Archivist Meetings:   Marland Kitchen Marital Status:   Intimate Partner Violence:   . Fear of Current or Ex-Partner:   . Emotionally Abused:   Marland Kitchen Physically Abused:   . Sexually Abused:      Family History  Problem Relation Age of Onset  . Hypertension Mother   . Heart disease Father   . Diabetes Father   . Stroke Father   . Heart disease Paternal Grandfather   . Breast cancer Maternal Aunt   . Colon cancer Maternal Aunt 80  . Breast cancer Paternal Grandmother   . Diabetes Other   . Melanoma Neg Hx      ROS:  Please see the history of present illness.     All other systems reviewed and negative.    Physical Exam: Blood pressure 130/80, pulse 64, height 5' 6.5" (1.689 m), weight 158 lb (71.7 kg). General: Well developed, well nourished female in no acute distress. Head: Normocephalic, atraumatic, sclera non-icteric, no xanthomas, nares are without discharge. EENT: normal  Lymph Nodes:  none Neck: Negative for carotid bruits. JVD not elevated. Back:without scoliosis kyphosis Lungs: Clear bilaterally to auscultation without  wheezes, rales, or rhonchi. Breathing is unlabored. Heart: RRR with S1 S2. No  murmur . No rubs, or gallops appreciated. Abdomen: Soft, non-tender, non-distended with normoactive bowel sounds. No hepatomegaly. No rebound/guarding. No obvious abdominal masses. Msk:  Strength and tone appear normal for age. Extremities: No clubbing or cyanosis. No edema.  Distal pedal pulses are 2+ and equal bilaterally.  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 CN III-XII intact Grossly normal sensory and motor function . Psych:  Responds to questions appropriately with a normal affect.      Labs: Cardiac Enzymes No results for input(s): CKTOTAL, CKMB, TROPONINI in the last 72 hours. CBC Lab Results  Component Value Date   WBC 5.6 03/27/2020   HGB 13.6 03/27/2020   HCT 39.2 03/27/2020   MCV 90 03/27/2020   PLT 223 03/27/2020   PROTIME: No results for input(s): LABPROT, INR in the last 72 hours. Chemistry No results for input(s): NA, K, CL, CO2, BUN, CREATININE, CALCIUM, PROT, BILITOT, ALKPHOS, ALT, AST, GLUCOSE in the last 168 hours.  Invalid input(s): LABALBU Lipids Lab Results  Component Value Date   CHOL 217 (H) 03/27/2020   HDL 64 03/27/2020   LDLCALC 139 (H) 03/27/2020   TRIG 79 03/27/2020   BNP No results found for: PROBNP Thyroid Function Tests: No results for input(s): TSH, T4TOTAL, T3FREE, THYROIDAB in the last 72 hours.  Invalid input(s): FREET3 Miscellaneous No results found for: DDIMER  Radiology/Studies:  No results found.  EKG: Personally reviewed from 04/22/2020 Sinus at 62 Interval 13.08/04 Nonspecific ST changes  Assessment and Plan:  Atrial fibrillation-paroxysmal-SCAF  Hypertension  Arachnodactyly  Heat intolerance   The patient has symptomatic nonsustained atrial tachycardia, PACs as well as quite symptomatic atrial fibrillation with the longest documented run of about 2.3 minutes.  She has been started on  anticoagulation for CHA2DS2-VASc score of 2.  Her symptomatic atrial fibrillation has been quiescient since the fall.  Her blood pressure is managed with amlodipine which is not unreasonable.  If she would have more symptoms, we could try a nondihydropyridine calcium blocker or beta-blocker.  Her major question is related to anticoagulation.  Is a reasonable thing to have started anticoagulation with a CHA2DS2-VASc score of 2.  However, I would think about her differently from that.  First, atrial fibrillation identified on monitoring, typically called SCAF because it is frequently asymptomatic, has been associated with a significantly lower risk of stroke and clinically identified atrial fibrillation which mostly is seen in patients who present with symptoms long enough to get the hospital and are found to have atrial fibrillation.  Duration is clearly longer than what is identified on monitoring.   SCAF stroke risk is thought to be about half the risk of clinical AF. Second, study from a large [place] registry has suggested that not all CHA2DS2-VASc points are the same.  The most predictive of stroke is age.  Diabetes and hypertension bradycardia in the 0.5 RR range.  This further reduces the predicted risk of stroke.  In women with a CHA2DS2-VASc score of 2, it is not clear that anticoagulation is indicated.  Hence, given the mitigated risk in the unclear benefit we have, after long discussion, decided to discontinue her anticoagulation  We discussed also the role of catheter ablation for the elimination of the triggering beats which likely is giving rise to her nonsustained atrial tachycardia as well as her atrial fibrillation.  At this point, however, she would like to try other things.  Specifically, she has sleep apnea and she is open to sleep testing and CPAP.  Moreover, knowing that her atrial fibrillation is not likely life-threatening she is willing to delay a decision regarding targeted  ablation.  We also discussed the physiology of orthostatic intolerance, symptoms of which are mild but in the context of arachnodactyly probably real.        Remo Lipps  Caryl Comes

## 2020-04-25 ENCOUNTER — Encounter: Payer: Self-pay | Admitting: Internal Medicine

## 2020-04-25 ENCOUNTER — Ambulatory Visit (INDEPENDENT_AMBULATORY_CARE_PROVIDER_SITE_OTHER): Payer: Managed Care, Other (non HMO) | Admitting: Internal Medicine

## 2020-04-25 ENCOUNTER — Other Ambulatory Visit: Payer: Self-pay

## 2020-04-25 VITALS — BP 130/80 | HR 64 | Ht 66.5 in | Wt 158.0 lb

## 2020-04-25 DIAGNOSIS — G4730 Sleep apnea, unspecified: Secondary | ICD-10-CM

## 2020-04-25 DIAGNOSIS — I480 Paroxysmal atrial fibrillation: Secondary | ICD-10-CM | POA: Diagnosis not present

## 2020-04-25 NOTE — Patient Instructions (Addendum)
Medication Instructions:  - Your physician has recommended you make the following change in your medication:   1) STOP eliquis  *If you need a refill on your cardiac medications before your next appointment, please call your pharmacy*   Lab Work: - none ordered  If you have labs (blood work) drawn today and your tests are completely normal, you will receive your results only by: Marland Kitchen MyChart Message (if you have MyChart) OR . A paper copy in the mail If you have any lab test that is abnormal or we need to change your treatment, we will call you to review the results.   Testing/Procedures: - none ordered   Follow-Up: At [facility], you and your health needs are our priority.  As part of our continuing mission to provide you with exceptional heart care, we have created designated Provider Care Teams.  These Care Teams include your primary Cardiologist (physician) and Advanced Practice Providers (APPs -  Physician Assistants and Nurse Practitioners) who all work together to provide you with the care you need, when you need it.  We recommend signing up for the patient portal called "MyChart".  Sign up information is provided on this After Visit Summary.  MyChart is used to connect with patients for Virtual Visits (Telemedicine).  Patients are able to view lab/test results, encounter notes, upcoming appointments, etc.  Non-urgent messages can be sent to your provider as well.   To learn more about what you can do with MyChart, go to [email or web address].    Your next appointment:   3 month(s)  The format for your next appointment:   In Person  Provider:   Virl Axe, MD   Other Instructions - You have been referred to : [facility] for evaluation for a sleep study (they will call you to schedule this appointment).

## 2020-06-04 ENCOUNTER — Encounter: Payer: Self-pay | Admitting: Family

## 2020-07-10 ENCOUNTER — Telehealth: Payer: Self-pay | Admitting: Family

## 2020-07-12 NOTE — Telephone Encounter (Signed)
There arent any notes here?  What was the message?

## 2020-07-19 NOTE — Telephone Encounter (Signed)
Rejection Reason - Patient did not respond" °[facility] said 1 day ago °

## 2020-08-06 NOTE — Telephone Encounter (Signed)
I apologize,  Rejection Reason - Patient did not respond" [facility] said 1 day ago

## 2020-08-08 ENCOUNTER — Ambulatory Visit: Payer: Managed Care, Other (non HMO) | Admitting: Internal Medicine

## 2020-08-16 ENCOUNTER — Other Ambulatory Visit: Payer: Self-pay | Admitting: Family

## 2020-08-16 DIAGNOSIS — I10 Essential (primary) hypertension: Secondary | ICD-10-CM

## 2020-09-04 ENCOUNTER — Other Ambulatory Visit: Payer: Self-pay | Admitting: Dermatology

## 2020-09-04 ENCOUNTER — Encounter: Payer: Self-pay | Admitting: Dermatology

## 2020-09-04 ENCOUNTER — Ambulatory Visit: Payer: Managed Care, Other (non HMO) | Admitting: Dermatology

## 2020-09-04 ENCOUNTER — Other Ambulatory Visit: Payer: Self-pay

## 2020-09-04 ENCOUNTER — Encounter: Payer: Self-pay | Admitting: Family

## 2020-09-04 DIAGNOSIS — L820 Inflamed seborrheic keratosis: Secondary | ICD-10-CM

## 2020-09-04 DIAGNOSIS — D492 Neoplasm of unspecified behavior of bone, soft tissue, and skin: Secondary | ICD-10-CM

## 2020-09-04 DIAGNOSIS — L814 Other melanin hyperpigmentation: Secondary | ICD-10-CM

## 2020-09-04 DIAGNOSIS — D229 Melanocytic nevi, unspecified: Secondary | ICD-10-CM

## 2020-09-04 DIAGNOSIS — Z1283 Encounter for screening for malignant neoplasm of skin: Secondary | ICD-10-CM

## 2020-09-04 DIAGNOSIS — D2271 Melanocytic nevi of right lower limb, including hip: Secondary | ICD-10-CM

## 2020-09-04 DIAGNOSIS — D2272 Melanocytic nevi of left lower limb, including hip: Secondary | ICD-10-CM

## 2020-09-04 DIAGNOSIS — L578 Other skin changes due to chronic exposure to nonionizing radiation: Secondary | ICD-10-CM

## 2020-09-04 DIAGNOSIS — L821 Other seborrheic keratosis: Secondary | ICD-10-CM

## 2020-09-04 DIAGNOSIS — D1801 Hemangioma of skin and subcutaneous tissue: Secondary | ICD-10-CM | POA: Diagnosis not present

## 2020-09-04 DIAGNOSIS — D225 Melanocytic nevi of trunk: Secondary | ICD-10-CM

## 2020-09-04 NOTE — Progress Notes (Signed)
New Patient Visit  Subjective  Briana Klein is a 49 y.o. female who presents for the following: Annual Exam (Would like skin check. C/O rough scaly spot on face and red spots.).  Spot on face gets irritated and sore.  She has moles to check- unaware of any changes.  She would like red spots removed.  .  Objective  Well appearing patient in no apparent distress; mood and affect are within normal limits.  Review of Systems: No other skin or systemic complaints except as noted in HPI or Assessment and Plan.  A full examination was performed including scalp, head, eyes, ears, nose, lips, neck, chest, axillae, abdomen, back, buttocks, bilateral upper extremities, bilateral lower extremities, hands, feet, fingers, toes, fingernails, and toenails. All findings within normal limits unless otherwise noted below.  Objective  Left spinal upper back: 60mm speckled brown macule     Objective  Right Forehead x1: Erythematous keratotic or waxy stuck-on papule  Objective  Left Lower Cutaneous Lip, Mid Forehead: Red papules on face, also on trunk  Objective  Right posterior flank, left flank, right mid abdomen, right medial knee, left ankle: 8x93mm brown macule with darker edge at right posterior flank  Brown macules at left flank  1.7 cm reg brown patch right mid abdomen  3mm med-dark brown macule at right lateral knee  Photos taken today.  56mm med-dark brown macule at left medial ankle  Images          Assessment & Plan    Lentigines - Scattered tan macules - Discussed due to sun exposure - Benign, observe - Call for any changes  Seborrheic Keratoses - Stuck-on, waxy, tan-brown papules and plaques  - Discussed benign etiology and prognosis. - Observe - Call for any changes  Melanocytic Nevi - Tan-brown and/or pink-flesh-colored symmetric macules and papules - Benign appearing on exam today - Observation - Call clinic for new or changing moles - Recommend  daily use of broad spectrum spf 30+ sunscreen to sun-exposed areas.   Hemangiomas - Red papules - Discussed benign nature - Observe - Call for any changes  Actinic Damage - diffuse scaly erythematous macules with underlying dyspigmentation - Recommend daily broad spectrum sunscreen SPF 30+ to sun-exposed areas, reapply every 2 hours as needed.  - Call for new or changing lesions.  Skin cancer screening performed today.  Neoplasm of skin Left spinal upper back  Epidermal / dermal shaving  Lesion diameter (cm):  0.4 Informed consent: discussed and consent obtained   Patient was prepped and draped in usual sterile fashion: Area prepped with alcohol. Anesthesia: the lesion was anesthetized in a standard fashion   Anesthetic:  1% lidocaine w/ epinephrine 1-100,000 buffered w/ 8.4% NaHCO3 Instrument used: flexible razor blade   Hemostasis achieved with: pressure, aluminum chloride and electrodesiccation   Outcome: patient tolerated procedure well   Post-procedure details: wound care instructions given   Post-procedure details comment:  Ointment and small bandage applied Additional details:  9x51mm final defect  Shave removal and bx today- nevus r/o dysplasia  Other Related Procedures Pathology ([facility])  Inflamed seborrheic keratosis Right Forehead x1  Destruction of lesion - Right Forehead x1  Destruction method: cryotherapy   Informed consent: discussed and consent obtained   Lesion destroyed using liquid nitrogen: Yes   Region frozen until ice ball extended beyond lesion: Yes   Outcome: patient tolerated procedure well with no complications   Post-procedure details: wound care instructions given    Hemangioma of skin (2) Mid  Forehead; Left Lower Cutaneous Lip  Benign. observe Discussed cosmetic procedure ED, noncovered.  $60 for 1st lesion and $15 for each additional lesion if done on the same day.  Maximum charge $350.  One touch-up treatment included no charge.  Discussed risks of treatment including dyspigmentation, small scar, and/or recurrence.   Discussed BBL treatment.  Less risk for scar.  Pt prefers laser treatment.  Will refer to [facility] for laser.  No tan on face prior to treatment.  Discussed $200 1-2 lesions on face, if more Tx $350  Nevus Right posterior flank, left flank, right mid abdomen, right medial knee, left ankle  Benign-appearing.  Observation.  Call clinic for new or changing moles.  Recommend daily use of broad spectrum spf 30+ sunscreen to sun-exposed areas.     Return in about 1 year (around 09/04/2021) for annual skin exam.   I, Emelia Salisbury, CMA, am acting as scribe for Brendolyn Patty, MD.  Documentation: I have reviewed the above documentation for accuracy and completeness, and I agree with the above.  Brendolyn Patty MD

## 2020-09-04 NOTE — Patient Instructions (Addendum)
Wound Care Instructions  1. Cleanse wound gently with soap and water once a day then pat dry with clean gauze. Apply a thing coat of Petrolatum (petroleum jelly, "Vaseline") over the wound (unless you have an allergy to this). We recommend that you use a new, sterile tube of Vaseline. Do not pick or remove scabs. Do not remove the yellow or white "healing tissue" from the base of the wound.  2. Cover the wound with fresh, clean, nonstick gauze and secure with paper tape. You may use Band-Aids in place of gauze and tape if the would is small enough, but would recommend trimming much of the tape off as there is often too much. Sometimes Band-Aids can irritate the skin.  3. You should call the office for your biopsy report after 1 week if you have not already been contacted.  4. If you experience any problems, such as abnormal amounts of bleeding, swelling, significant bruising, significant pain, or evidence of infection, please call the office immediately.  5. FOR ADULT SURGERY PATIENTS: If you need something for pain relief you may take 1 extra strength Tylenol (acetaminophen) AND 2 Ibuprofen (200mg  each) together every 4 hours as needed for pain. (do not take these if you are allergic to them or if you have a reason you should not take them.) Typically, you may only need pain medication for 1 to 3 days.    Melanoma ABCDEs  Melanoma is the most dangerous type of skin cancer, and is the leading cause of death from skin disease.  You are more likely to develop melanoma if you:  Have light-colored skin, light-colored eyes, or red or blond hair  Spend a lot of time in the sun  Tan regularly, either outdoors or in a tanning bed  Have had blistering sunburns, especially during childhood  Have a close family member who has had a melanoma  Have atypical moles or large birthmarks  Early detection of melanoma is key since treatment is typically straightforward and cure rates are extremely high if  we catch it early.   The first sign of melanoma is often a change in a mole or a new dark spot.  The ABCDE system is a way of remembering the signs of melanoma.  A for asymmetry:  The two halves do not match. B for border:  The edges of the growth are irregular. C for color:  A mixture of colors are present instead of an even brown color. D for diameter:  Melanomas are usually (but not always) greater than 33mm - the size of a pencil eraser. E for evolution:  The spot keeps changing in size, shape, and color.  Please check your skin once per month between visits. You can use a small mirror in front and a large mirror behind you to keep an eye on the back side or your body.   If you see any new or changing lesions before your next follow-up, please call to schedule a visit.  Please continue daily skin protection including broad spectrum sunscreen SPF 30+ to sun-exposed areas, reapplying every 2 hours as needed when you're outdoors.   Cryotherapy Aftercare  . Wash gently with soap and water everyday.   Marland Kitchen Apply Vaseline and Band-Aid daily until healed.  Prior to procedure, discussed risks of blister formation, small wound, skin dyspigmentation, or rare scar following cryotherapy.

## 2020-09-07 LAB — ANATOMIC PATHOLOGY REPORT

## 2020-09-09 ENCOUNTER — Telehealth: Payer: Self-pay | Admitting: Family

## 2020-09-09 NOTE — Telephone Encounter (Signed)
Mychart message sent to patient.

## 2020-09-09 NOTE — Telephone Encounter (Signed)
Call pt  Your mammogram is normal this year. Please ensure annual mammogram next unless any concerns, symptoms prior.

## 2020-09-09 NOTE — Telephone Encounter (Signed)
LMTCB

## 2020-09-16 ENCOUNTER — Telehealth: Payer: Self-pay

## 2020-09-16 NOTE — Telephone Encounter (Signed)
Lft pt msg to call for bx results/sh °

## 2020-09-16 NOTE — Telephone Encounter (Signed)
-----   Message from Brendolyn Patty, MD sent at 09/16/2020 12:25 PM EDT ----- Lentiginous junctional nevus- no atypia  Benign mole

## 2020-09-18 ENCOUNTER — Telehealth: Payer: Self-pay

## 2020-09-18 NOTE — Telephone Encounter (Signed)
-----   Message from Brendolyn Patty, MD sent at 09/16/2020 12:25 PM EDT ----- Lentiginous junctional nevus- no atypia  Benign mole

## 2020-09-18 NOTE — Telephone Encounter (Signed)
Left patient message bx was a benign mole.  Advised pt to call the office if any questions./sh

## 2020-09-19 ENCOUNTER — Other Ambulatory Visit: Payer: Self-pay | Admitting: Family

## 2020-09-19 DIAGNOSIS — G4700 Insomnia, unspecified: Secondary | ICD-10-CM

## 2020-10-14 ENCOUNTER — Other Ambulatory Visit: Payer: Self-pay

## 2020-10-14 ENCOUNTER — Ambulatory Visit (INDEPENDENT_AMBULATORY_CARE_PROVIDER_SITE_OTHER): Payer: Self-pay

## 2020-10-14 DIAGNOSIS — D1801 Hemangioma of skin and subcutaneous tissue: Secondary | ICD-10-CM

## 2020-10-14 NOTE — Progress Notes (Signed)
Pt presents today for hemangioma's to the face. Risks and benefits reviewed. Pt tolerated well. Nice response. jj

## 2020-10-25 ENCOUNTER — Other Ambulatory Visit: Payer: Self-pay

## 2020-10-25 ENCOUNTER — Ambulatory Visit: Payer: Managed Care, Other (non HMO) | Admitting: Cardiology

## 2020-10-25 ENCOUNTER — Encounter: Payer: Self-pay | Admitting: Cardiology

## 2020-10-25 VITALS — BP 120/82 | HR 57 | Ht 66.5 in | Wt 156.5 lb

## 2020-10-25 DIAGNOSIS — I480 Paroxysmal atrial fibrillation: Secondary | ICD-10-CM | POA: Diagnosis not present

## 2020-10-25 DIAGNOSIS — I10 Essential (primary) hypertension: Secondary | ICD-10-CM

## 2020-10-25 NOTE — Patient Instructions (Signed)
Medication Instructions:  Your physician recommends that you continue on your current medications as directed. Please refer to the Current Medication list given to you today.  *If you need a refill on your cardiac medications before your next appointment, please call your pharmacy*   Lab Work: None ordered.   Testing/Procedures: None ordered.   Follow-Up: At [facility], you and your health needs are our priority.  As part of our continuing mission to provide you with exceptional heart care, we have created designated Provider Care Teams.  These Care Teams include your primary Cardiologist (physician) and Advanced Practice Providers (APPs -  Physician Assistants and Nurse Practitioners) who all work together to provide you with the care you need, when you need it.  We recommend signing up for the patient portal called "MyChart".  Sign up information is provided on this After Visit Summary.  MyChart is used to connect with patients for Virtual Visits (Telemedicine).  Patients are able to view lab/test results, encounter notes, upcoming appointments, etc.  Non-urgent messages can be sent to your provider as well.   To learn more about what you can do with MyChart, go to [email or web address].    Your next appointment:   6 month(s)  The format for your next appointment:   In Person  Provider:   Kate Sable, MD

## 2020-10-25 NOTE — Progress Notes (Signed)
Cardiology Office Note:    Date:  10/25/2020   ID:  Briana Klein, DOB Mar 04, 1971, MRN [id number]  PCP:  Burnard Hawthorne, FNP  Cardiologist:  Kate Sable, MD  Electrophysiologist:  None   Referring MD: Burnard Hawthorne, FNP   Chief Complaint  Patient presents with   other    6 month follow up. Meds reviewed by the pt. verbally. Pt. c/o fluttering in chest at times.     History of Present Illness:    Briana Klein is a 49 y.o. female with a hx of paroxysmal A. fib, anxiety, asthma  who presents for follow-up.  Previously seen for palpitations, cardiac monitor showed A. fib.  Initially started on Eliquis but she had concerns about taking Eliquis long-term and as such wanted to see EP regarding an ablation procedure so she can go off Eliquis.  To discussions with EP, it was decided her chads vas score of 2, risk factors when not significant enough for stroke risk.  Especially as her gender was included in score calculations.  Also her A. fib burden was low.  Eliquis was as such discontinued.  She has a history of snoring, likely sleep disordered breathing.  Sleep study and potentially CPAP if diagnosed with OSA was recommended.  Patient was also discussed, patient wanted to try other things including OSA eval first.  Prior notes She was originally seen due to palpitations and cardiac monitor was placed which showed episodes of atrial fibrillation.  Less than 1% burden.  Was placed on Eliquis, baseline heart rate in the high 50s to low 60s, rate control agent was therefore avoided. She is concerned about taking Eliquis long-term. Echocardiogram 11/2019 showed normal systolic and diastolic function, EF 60 to 65%  Past Medical History:  Diagnosis Date   Abnormal stress test    Dr. Mancel Parsons   Anxiety    Asthma    inhaler   Chronic diarrhea    history   Hyperlipidemia    Hypertension    Migraines    otc med prn   Seasonal allergies    SVD (spontaneous vaginal  delivery)    x 3   Vitamin D deficiency     Past Surgical History:  Procedure Laterality Date   HYSTEROSCOPY WITH NOVASURE N/A 12/12/2013   Procedure: HYSTEROSCOPY WITH NOVASURE;  Surgeon: Logan Bores, MD;  Location: [place] [facility];  Service: Gynecology;  Laterality: N/A;    Current Medications: Current Meds  Medication Sig   acetaminophen (TYLENOL) 325 MG tablet Take 650 mg by mouth every 6 (six) hours as needed.   albuterol (VENTOLIN HFA) 108 (90 Base) MCG/ACT inhaler INHALE 2 PUFFS BY MOUTH INTO THE LUNGS EVERY 6 HOURS AS NEEDED   amLODipine (NORVASC) 5 MG tablet TAKE 1 TABLET(5 MG) BY MOUTH DAILY   aspirin-acetaminophen-caffeine (EXCEDRIN MIGRAINE) 250-250-65 MG tablet Take by mouth every 6 (six) hours as needed for headache.   fexofenadine (ALLEGRA) 180 MG tablet Take 180 mg by mouth as needed for allergies or rhinitis.   fluticasone-salmeterol (ADVAIR HFA) 115-21 MCG/ACT inhaler INHALE 2 PUFFS INTO THE LUNGS TWICE DAILY AS NEEDED   traZODone (DESYREL) 50 MG tablet TAKE 1 TABLET(50 MG) BY MOUTH AT BEDTIME AS NEEDED FOR SLEEP     Allergies:   Amoxicillin-pot clavulanate   Social History   Socioeconomic History   Marital status: Married    Spouse name: Not on file   Number of children: 3   Years of education: Not on file  Highest education level: Not on file  Occupational History   Occupation: [employer]    Employer: [employer]  Tobacco Use   Smoking status: Never Smoker   Smokeless tobacco: Never Used  Vaping Use   Vaping Use: Never used  Substance and Sexual Activity   Alcohol use: Yes    Comment: social   Drug use: No   Sexual activity: Yes    Birth control/protection: None    Comment: husband vasectomy  Other Topics Concern   Not on file  Social History Narrative   Lives in [place]. Works in [employer] at [employer].       3 children. 2 cats and dog.       Diet: regular.    Daily Caffeine Use:  1 sweet tea and 1 soda    Regular Exercise -  None right now; had a personal trainer in 2016. ; plans to start biking in Fall           Social Determinants of Health   Financial Resource Strain:    Difficulty of Paying Living Expenses: Not on file  Food Insecurity:    Worried About Charity fundraiser in the Last Year: Not on file   [employer] of Food in the Last Year: Not on file  Transportation Needs:    Lack of Transportation (Medical): Not on file   Lack of Transportation (Non-Medical): Not on file  Physical Activity:    Days of Exercise per Week: Not on file   Minutes of Exercise per Session: Not on file  Stress:    Feeling of Stress : Not on file  Social Connections:    Frequency of Communication with Friends and Family: Not on file   Frequency of Social Gatherings with Friends and Family: Not on file   Attends Religious Services: Not on file   Active Member of Clubs or Organizations: Not on file   Attends Archivist Meetings: Not on file   Marital Status: Not on file     Family History: The patient's family history includes Breast cancer in her maternal aunt and paternal grandmother; Colon cancer (age of onset: 71) in her maternal aunt; Diabetes in her father and another family member; Heart disease in her father and paternal grandfather; Hypertension in her mother; Stroke in her father. There is no history of Melanoma.  ROS:   Please see the history of present illness.     All other systems reviewed and are negative.  EKGs/Labs/Other Studies Reviewed:    The following studies were reviewed today:  2-week cardiac monitor date 10/13/2019 Patient had a min HR of 47 bpm, max HR of 172 bpm, and avg HR of 64 bpm. Predominant underlying rhythm was Sinus Rhythm. 17 Supraventricular Tachycardia runs occurred, the run with the fastest interval lasting 6 beats with a max rate of 158 bpm, the longest lasting 9 beats with an avg rate of 134 bpm. Atrial Fibrillation occurred (<1%  burden), ranging from 98-172 bpm (avg of 129 bpm), the longest lasting 2 mins 22 secs with an avg rate of 139 bpm. Junctional Rhythm was present. Supraventricular Tachycardia, Atrial Fibrillation and Junctional Rhythm were detected within +/- 45 seconds of symptomatic patient event(s). Isolated SVEs were rare (<1.0%), SVE Couplets were rare (<1.0%), and SVE Triplets were rare (<1.0%). Isolated VEs were rare (<1.0%), VE Couplets were rare (<1.0%), and no VE Triplets were present.  EKG:  EKG is  ordered today.  The ekg ordered today  demonstrates sinus bradycardia, heart rate 57.  Recent Labs: 03/27/2020: ALT 26; BUN 11; Creatinine, Ser 0.69; Hemoglobin 13.6; Platelets 223; Potassium 4.1; Sodium 142; TSH 3.630  Recent Lipid Panel    Component Value Date/Time   CHOL 217 (H) 03/27/2020 0949   TRIG 79 03/27/2020 0949   HDL 64 03/27/2020 0949   CHOLHDL 3.4 03/27/2020 0949   LDLCALC 139 (H) 03/27/2020 0949    Physical Exam:    VS:  BP 120/82 (BP Location: Left Arm, Patient Position: Sitting, Cuff Size: Normal)    Pulse (!) 57    Ht 5' 6.5" (1.689 m)    Wt 156 lb 8 oz (71 kg)    SpO2 99%    BMI 24.88 kg/m     Wt Readings from Last 3 Encounters:  10/25/20 156 lb 8 oz (71 kg)  04/25/20 158 lb (71.7 kg)  04/22/20 159 lb 2 oz (72.2 kg)     GEN:  Well nourished, well developed in no acute distress HEENT: Normal NECK: No JVD; No carotid bruits LYMPHATICS: No lymphadenopathy CARDIAC: RRR, no murmurs, rubs, gallops RESPIRATORY:  Clear to auscultation without rales, wheezing or rhonchi  ABDOMEN: Soft, non-tender, non-distended MUSCULOSKELETAL:  No edema; No deformity  SKIN: Warm and dry NEUROLOGIC:  Alert and oriented x 3 PSYCHIATRIC:  Normal affect   ASSESSMENT:    1. PAF (paroxysmal atrial fibrillation) ([place])   2. Essential hypertension    PLAN:    In order of problems listed above:  1. History of paroxysmal atrial fibrillation, CHA2DS2-VASc score of 2 (htn, female).  Holding off  on rate control agents due to low normal heart rates.  Last echocardiogram shows normal systolic and diastolic function, EF 60 to 65%.  Eliquis stopped at the recommendation of EP due to low risk for stroke from her CHA2DS2-VASc scoring criteria including gender.  Patient will let us know when ready to go ahead with sleep study hopefully in the next several months. 2. hypertension, blood pressure controlled.  Continue Norvasc 5 mg.  Follow-up in 6 months.  Medication Adjustments/Labs and Tests Ordered: Current medicines are reviewed at length with the patient today.  Concerns regarding medicines are outlined above.  Orders Placed This Encounter  Procedures   EKG 12-Lead   No orders of the defined types were placed in this encounter.   Patient Instructions  Medication Instructions:  Your physician recommends that you continue on your current medications as directed. Please refer to the Current Medication list given to you today.  *If you need a refill on your cardiac medications before your next appointment, please call your pharmacy*   Lab Work: None ordered.   Testing/Procedures: None ordered.   Follow-Up: At [facility], you and your health needs are our priority.  As part of our continuing mission to provide you with exceptional heart care, we have created designated Provider Care Teams.  These Care Teams include your primary Cardiologist (physician) and Advanced Practice Providers (APPs -  Physician Assistants and Nurse Practitioners) who all work together to provide you with the care you need, when you need it.  We recommend signing up for the patient portal called "MyChart".  Sign up information is provided on this After Visit Summary.  MyChart is used to connect with patients for Virtual Visits (Telemedicine).  Patients are able to view lab/test results, encounter notes, upcoming appointments, etc.  Non-urgent messages can be sent to your provider as well.   To learn more  about what you can do  with MyChart, go to [email or web address].    Your next appointment:   6 month(s)  The format for your next appointment:   In Person  Provider:   Kate Sable, MD        Signed, Kate Sable, MD  10/25/2020 4:37 PM    [place]

## 2020-12-12 ENCOUNTER — Telehealth: Payer: Self-pay | Admitting: Family

## 2020-12-12 ENCOUNTER — Ambulatory Visit
Admission: EM | Admit: 2020-12-12 | Discharge: 2020-12-12 | Disposition: A | Discharge status: Home / Self Care | Payer: Managed Care, Other (non HMO) | Attending: Family Medicine | Admitting: Family Medicine

## 2020-12-12 DIAGNOSIS — B349 Viral infection, unspecified: Secondary | ICD-10-CM | POA: Insufficient documentation

## 2020-12-12 LAB — POCT RAPID STREP A (OFFICE): Rapid Strep A Screen: NEGATIVE

## 2020-12-12 NOTE — Discharge Instructions (Signed)
Your strep test is negative.  This is most likely some the viral. You can take over-the-counter medicines as needed for your symptoms. Covid and flu test pending. Rest, hydrate

## 2020-12-12 NOTE — Telephone Encounter (Signed)
Pt was straight transferred to the office from Access Nurse   Pt has been in contact with coworkers that have  tested positive for RSV and strep throat  She has developed a sore throat and a cough  Pt said that she would go to [facility] to be seen and get tested

## 2020-12-12 NOTE — Telephone Encounter (Signed)
FYI patient at [facility].

## 2020-12-12 NOTE — ED Provider Notes (Signed)
Briana Klein    CSN: [id number] Arrival date & time: 12/12/20  [id number]      History   Chief Complaint Chief Complaint  Patient presents with  . Cough    HPI Briana Klein is a 49 y.o. female.   Patient is a 49 year old female presents today with nonproductive cough, sore throat, headache, chills that began yesterday. She has had some mild fatigue. The worst part is a sore throat. She was exposed to possible flu and strep at work. Has not taken any medicines for her symptoms     Past Medical History:  Diagnosis Date  . Abnormal stress test    Dr. Mancel Parsons  . Anxiety   . Asthma    inhaler  . Chronic diarrhea    history  . Hyperlipidemia   . Hypertension   . Migraines    otc med prn  . Seasonal allergies   . SVD (spontaneous vaginal delivery)    x 3  . Vitamin D deficiency     Patient Active Problem List   Diagnosis Date Noted  . Paroxysmal atrial fibrillation ([place]) 03/27/2020  . Insomnia 12/02/2017  . Lipoma of extremity 08/09/2017  . Vitamin D deficiency 08/08/2016  . Hyperlipidemia 08/08/2016  . Routine general medical examination at a health care facility 04/24/2013  . HTN (hypertension) 03/06/2013  . Seasonal allergies 03/06/2013  . IBS (irritable bowel syndrome) 11/25/2011  . Anxiety 11/25/2011  . Asthma in adult 10/24/2011    Past Surgical History:  Procedure Laterality Date  . HYSTEROSCOPY WITH NOVASURE N/A 12/12/2013   Procedure: HYSTEROSCOPY WITH NOVASURE;  Surgeon: Logan Bores, MD;  Location: [facility];  Service: Gynecology;  Laterality: N/A;    OB History   No obstetric history on file.      Home Medications    Prior to Admission medications   Medication Sig Start Date End Date Taking? Authorizing Provider  acetaminophen (TYLENOL) 325 MG tablet Take 650 mg by mouth every 6 (six) hours as needed.    [provider]  albuterol (VENTOLIN HFA) 108 (90 Base) MCG/ACT inhaler INHALE 2 PUFFS BY MOUTH INTO THE LUNGS  EVERY 6 HOURS AS NEEDED 12/28/18   Burnard Hawthorne, FNP  amLODipine (NORVASC) 5 MG tablet TAKE 1 TABLET(5 MG) BY MOUTH DAILY 08/16/20   Burnard Hawthorne, FNP  aspirin-acetaminophen-caffeine (EXCEDRIN MIGRAINE) [phone] MG tablet Take by mouth every 6 (six) hours as needed for headache.    [provider]  fexofenadine (ALLEGRA) 180 MG tablet Take 180 mg by mouth as needed for allergies or rhinitis.    [provider]  fluticasone-salmeterol (ADVAIR HFA) 115-21 MCG/ACT inhaler INHALE 2 PUFFS INTO THE LUNGS TWICE DAILY AS NEEDED 01/02/19   Thersa Salt G, DO  traZODone (DESYREL) 50 MG tablet TAKE 1 TABLET(50 MG) BY MOUTH AT BEDTIME AS NEEDED FOR SLEEP 09/20/20   Burnard Hawthorne, FNP    Family History Family History  Problem Relation Age of Onset  . Hypertension Mother   . Heart disease Father   . Diabetes Father   . Stroke Father   . Heart disease Paternal Grandfather   . Breast cancer Maternal Aunt   . Colon cancer Maternal Aunt 80  . Breast cancer Paternal Grandmother   . Diabetes Other   . Melanoma Neg Hx     Social History Social History   Tobacco Use  . Smoking status: Never Smoker  . Smokeless tobacco: Never Used  Vaping Use  . Vaping Use:  Never used  Substance Use Topics  . Alcohol use: Yes    Comment: social  . Drug use: No     Allergies   Amoxicillin-pot clavulanate   Review of Systems Review of Systems   Physical Exam Triage Vital Signs ED Triage Vitals [12/12/20 1021]  Enc Vitals Group     BP (!) 157/94     Pulse Rate 72     Resp 16     Temp 98.1 F (36.7 C)     Temp Source Tympanic     SpO2 99 %     Weight 159 lb (72.1 kg)     Height 5\' 6"  (1.676 m)     Head Circumference      Peak Flow      Pain Score 6     Pain Loc      Pain Edu?      Excl. in [place]?    No data found.  Updated Vital Signs BP (!) 157/94   Pulse 72   Temp 98.1 F (36.7 C) (Tympanic)   Resp 16   Ht 5\' 6"  (1.676 m)   Wt 159 lb (72.1 kg)   SpO2 99%    BMI 25.66 kg/m   Visual Acuity Right Eye Distance:   Left Eye Distance:   Bilateral Distance:    Right Eye Near:   Left Eye Near:    Bilateral Near:     Physical Exam Vitals and nursing note reviewed.  Constitutional:      General: She is not in acute distress.    Appearance: Normal appearance. She is not ill-appearing, toxic-appearing or diaphoretic.  HENT:     Head: Normocephalic.     Right Ear: Tympanic membrane and ear canal normal.     Left Ear: Tympanic membrane and ear canal normal.     Nose: Nose normal.     Mouth/Throat:     Pharynx: Posterior oropharyngeal erythema present.  Eyes:     Conjunctiva/sclera: Conjunctivae normal.  Cardiovascular:     Rate and Rhythm: Normal rate and regular rhythm.  Pulmonary:     Effort: Pulmonary effort is normal.     Breath sounds: Normal breath sounds.  Musculoskeletal:        General: Normal range of motion.     Cervical back: Normal range of motion.  Skin:    General: Skin is warm and dry.     Findings: No rash.  Neurological:     Mental Status: She is alert.  Psychiatric:        Mood and Affect: Mood normal.      UC Treatments / Results  Labs (all labs ordered are listed, but only abnormal results are displayed) Labs Reviewed  COVID-19, FLU A+B NAA  CULTURE, GROUP A STREP [facility])  POCT RAPID STREP A (OFFICE)    EKG   Radiology No results found.  Procedures Procedures (including critical care time)  Medications Ordered in UC Medications - No data to display  Initial Impression / Assessment and Plan / UC Course  I have reviewed the triage vital signs and the nursing notes.  Pertinent labs & imaging results that were available during my care of the patient were reviewed by me and considered in my medical decision making (see chart for details).     Viral illness Strep test negative. This is most likely something viral. Covid and flu swab pending. Recommend over-the-counter medicines as  needed. Follow up as needed for continued or worsening symptoms  Final Clinical Impressions(s) / UC Diagnoses   Final diagnoses:  Viral illness     Discharge Instructions     Your strep test is negative.  This is most likely some the viral. You can take over-the-counter medicines as needed for your symptoms. Covid and flu test pending. Rest, hydrate    ED Prescriptions    None     PDMP not reviewed this encounter.   Loura Halt A, NP 12/12/20 1051

## 2020-12-12 NOTE — ED Triage Notes (Signed)
Pt reports having non productive cough, sore throat and headache that began yesterday. Also reports feeling fatigue.

## 2020-12-14 LAB — COVID-19, FLU A+B NAA
Influenza A, NAA: NOT DETECTED
Influenza B, NAA: NOT DETECTED
SARS-CoV-2, NAA: NOT DETECTED

## 2020-12-15 LAB — CULTURE, GROUP A STREP (THRC)

## 2020-12-19 ENCOUNTER — Other Ambulatory Visit: Payer: Self-pay | Admitting: Family

## 2020-12-19 DIAGNOSIS — I10 Essential (primary) hypertension: Secondary | ICD-10-CM

## 2020-12-20 ENCOUNTER — Other Ambulatory Visit: Payer: Self-pay | Admitting: Family

## 2020-12-20 DIAGNOSIS — G4700 Insomnia, unspecified: Secondary | ICD-10-CM

## 2021-02-11 ENCOUNTER — Ambulatory Visit
Admission: EM | Admit: 2021-02-11 | Discharge: 2021-02-11 | Disposition: A | Discharge status: Home / Self Care | Payer: Managed Care, Other (non HMO) | Attending: Family Medicine | Admitting: Family Medicine

## 2021-02-11 ENCOUNTER — Other Ambulatory Visit: Payer: Self-pay

## 2021-02-11 DIAGNOSIS — Z7951 Long term (current) use of inhaled steroids: Secondary | ICD-10-CM | POA: Diagnosis not present

## 2021-02-11 DIAGNOSIS — Z79899 Other long term (current) drug therapy: Secondary | ICD-10-CM | POA: Insufficient documentation

## 2021-02-11 DIAGNOSIS — Z880 Allergy status to penicillin: Secondary | ICD-10-CM | POA: Insufficient documentation

## 2021-02-11 DIAGNOSIS — Z7982 Long term (current) use of aspirin: Secondary | ICD-10-CM | POA: Insufficient documentation

## 2021-02-11 DIAGNOSIS — J45901 Unspecified asthma with (acute) exacerbation: Secondary | ICD-10-CM | POA: Insufficient documentation

## 2021-02-11 DIAGNOSIS — Z20822 Contact with and (suspected) exposure to covid-19: Secondary | ICD-10-CM | POA: Insufficient documentation

## 2021-02-11 DIAGNOSIS — J029 Acute pharyngitis, unspecified: Secondary | ICD-10-CM | POA: Diagnosis not present

## 2021-02-11 DIAGNOSIS — J019 Acute sinusitis, unspecified: Secondary | ICD-10-CM | POA: Insufficient documentation

## 2021-02-11 DIAGNOSIS — Z7952 Long term (current) use of systemic steroids: Secondary | ICD-10-CM | POA: Diagnosis not present

## 2021-02-11 LAB — SARS CORONAVIRUS 2 (TAT 6-24 HRS): SARS Coronavirus 2: NEGATIVE

## 2021-02-11 LAB — GROUP A STREP BY PCR: Group A Strep by PCR: NOT DETECTED

## 2021-02-11 MED ORDER — PREDNISONE 50 MG PO TABS: ORAL_TABLET | ORAL | 0 refills | Status: DC

## 2021-02-11 MED ORDER — ALBUTEROL SULFATE HFA 108 (90 BASE) MCG/ACT IN AERS: INHALATION_SPRAY | RESPIRATORY_TRACT | 3 refills | Status: DC

## 2021-02-11 MED ORDER — ADVAIR HFA 115-21 MCG/ACT IN AERO: INHALATION_SPRAY | RESPIRATORY_TRACT | 0 refills | Status: DC

## 2021-02-11 MED ORDER — DOXYCYCLINE HYCLATE 100 MG PO CAPS: 100.0000 mg | ORAL_CAPSULE | Freq: Two times a day (BID) | ORAL | 0 refills | Status: DC

## 2021-02-11 NOTE — ED Triage Notes (Signed)
Patient presents to Urgent Care with complaints of cough, sore throat since 02/23. Had a negative rapid covid test 02/27. Treating symptoms with Tylenol, dayquil.   Denies fever, abdominal pain, n/v, or diarrhea.

## 2021-02-11 NOTE — ED Provider Notes (Signed)
[facility]    CSN: [id number] Arrival date & time: 02/11/21  0858      History   Chief Complaint Chief Complaint  Patient presents with  . Sore Throat  . Cough   HPI  50 year old female presents with the above complaints.  Patient states that she has been sick for approximately 1 week.  She states that she has known asthma.  She is in need of her inhalers.  She reports that she has had cough, sinus pressure and congestion.  She is also had a scratchy throat.  She has been treating her symptoms with over-the-counter Tylenol and DayQuil.  No documented fever.  No reported sick contacts.  Has had recent negative Covid testing.  She notes some chest tightness/shortness of breath particularly associated with a cough.  No relieving factors.  No other complaints.  Past Medical History:  Diagnosis Date  . Abnormal stress test    Dr. Mancel Parsons  . Anxiety   . Asthma    inhaler  . Chronic diarrhea    history  . Hyperlipidemia   . Hypertension   . Migraines    otc med prn  . Seasonal allergies   . SVD (spontaneous vaginal delivery)    x 3  . Vitamin D deficiency     Patient Active Problem List   Diagnosis Date Noted  . Paroxysmal atrial fibrillation ([place]) 03/27/2020  . Insomnia 12/02/2017  . Lipoma of extremity 08/09/2017  . Vitamin D deficiency 08/08/2016  . Hyperlipidemia 08/08/2016  . Routine general medical examination at a health care facility 04/24/2013  . HTN (hypertension) 03/06/2013  . Seasonal allergies 03/06/2013  . IBS (irritable bowel syndrome) 11/25/2011  . Anxiety 11/25/2011  . Asthma in adult 10/24/2011    Past Surgical History:  Procedure Laterality Date  . HYSTEROSCOPY WITH NOVASURE N/A 12/12/2013   Procedure: HYSTEROSCOPY WITH NOVASURE;  Surgeon: Logan Bores, MD;  Location: [facility];  Service: Gynecology;  Laterality: N/A;    OB History   No obstetric history on file.      Home Medications    Prior to Admission medications    Medication Sig Start Date End Date Taking? Authorizing Provider  doxycycline (VIBRAMYCIN) 100 MG capsule Take 1 capsule (100 mg total) by mouth 2 (two) times daily. 02/11/21  Yes Allyce Bochicchio G, DO  predniSONE (DELTASONE) 50 MG tablet 1 tablet daily x 5 days 02/11/21  Yes Garcia Dalzell G, DO  acetaminophen (TYLENOL) 325 MG tablet Take 650 mg by mouth every 6 (six) hours as needed.    [provider]  albuterol (VENTOLIN HFA) 108 (90 Base) MCG/ACT inhaler INHALE 2 PUFFS BY MOUTH INTO THE LUNGS EVERY 6 HOURS AS NEEDED 02/11/21   Thersa Salt G, DO  amLODipine (NORVASC) 5 MG tablet TAKE 1 TABLET(5 MG) BY MOUTH DAILY 12/20/20   Burnard Hawthorne, FNP  aspirin-acetaminophen-caffeine (EXCEDRIN MIGRAINE) [phone] MG tablet Take by mouth every 6 (six) hours as needed for headache.    [provider]  fexofenadine (ALLEGRA) 180 MG tablet Take 180 mg by mouth as needed for allergies or rhinitis.    [provider]  fluticasone-salmeterol (ADVAIR HFA) 115-21 MCG/ACT inhaler INHALE 2 PUFFS INTO THE LUNGS TWICE DAILY AS NEEDED 02/11/21   Coral Spikes, DO  traZODone (DESYREL) 50 MG tablet TAKE 1 TABLET(50 MG) BY MOUTH AT BEDTIME AS NEEDED FOR SLEEP 12/20/20   Burnard Hawthorne, FNP    Family History Family History  Problem Relation Age of  Onset  . Hypertension Mother   . Heart disease Father   . Diabetes Father   . Stroke Father   . Heart disease Paternal Grandfather   . Breast cancer Maternal Aunt   . Colon cancer Maternal Aunt 80  . Breast cancer Paternal Grandmother   . Diabetes Other   . Melanoma Neg Hx     Social History Social History   Tobacco Use  . Smoking status: Never Smoker  . Smokeless tobacco: Never Used  Vaping Use  . Vaping Use: Never used  Substance Use Topics  . Alcohol use: Yes    Comment: social  . Drug use: No     Allergies   Amoxicillin-pot clavulanate   Review of Systems Review of Systems  HENT: Positive for congestion and sore throat.    Respiratory: Positive for cough.    Physical Exam Triage Vital Signs ED Triage Vitals  Enc Vitals Group     BP 02/11/21 0927 128/90     Pulse Rate 02/11/21 0927 68     Resp 02/11/21 0927 16     Temp 02/11/21 0927 98.2 F (36.8 C)     Temp Source 02/11/21 0927 Oral     SpO2 02/11/21 0927 99 %     Weight 02/11/21 0926 155 lb (70.3 kg)     Height --      Head Circumference --      Peak Flow --      Pain Score 02/11/21 0926 0     Pain Loc --      Pain Edu? --      Excl. in [place]? --    Updated Vital Signs BP 128/90 (BP Location: Left Arm)   Pulse 68   Temp 98.2 F (36.8 C) (Oral)   Resp 16   Wt 70.3 kg   SpO2 99%   BMI 25.02 kg/m   Visual Acuity Right Eye Distance:   Left Eye Distance:   Bilateral Distance:    Right Eye Near:   Left Eye Near:    Bilateral Near:     Physical Exam Vitals and nursing note reviewed.  Constitutional:      General: She is not in acute distress.    Appearance: She is well-developed. She is not ill-appearing.  HENT:     Head: Normocephalic and atraumatic.     Mouth/Throat:     Pharynx: Oropharynx is clear.  Eyes:     General:        Right eye: No discharge.        Left eye: No discharge.     Conjunctiva/sclera: Conjunctivae normal.  Cardiovascular:     Rate and Rhythm: Normal rate and regular rhythm.     Heart sounds: No murmur heard.   Pulmonary:     Effort: Pulmonary effort is normal.     Breath sounds: Normal breath sounds. No wheezing, rhonchi or rales.  Neurological:     Mental Status: She is alert.  Psychiatric:        Mood and Affect: Mood normal.        Behavior: Behavior normal.    UC Treatments / Results  Labs (all labs ordered are listed, but only abnormal results are displayed) Labs Reviewed  GROUP A STREP BY PCR  SARS CORONAVIRUS 2 (TAT 6-24 HRS)    EKG   Radiology No results found.  Procedures Procedures (including critical care time)  Medications Ordered in UC Medications - No data to  display  Initial Impression /  Assessment and Plan / UC Course  I have reviewed the triage vital signs and the nursing notes.  Pertinent labs & imaging results that were available during my care of the patient were reviewed by me and considered in my medical decision making (see chart for details).    50 year old female presents with sinusitis and associated cough.  Appears to be experiencing an asthma exacerbation as well.  Treating with albuterol, Advair, prednisone, Doxy.  Final Clinical Impressions(s) / UC Diagnoses   Final diagnoses:  Acute sinusitis, recurrence not specified, unspecified location  Asthma with acute exacerbation, unspecified asthma severity, unspecified whether persistent     Discharge Instructions     Medications as prescribed.  Take care  Dr. Lacinda Axon     ED Prescriptions    Medication Sig Dispense Auth. Provider   albuterol (VENTOLIN HFA) 108 (90 Base) MCG/ACT inhaler INHALE 2 PUFFS BY MOUTH INTO THE LUNGS EVERY 6 HOURS AS NEEDED 18 g Verenis Nicosia G, DO   fluticasone-salmeterol (ADVAIR HFA) 115-21 MCG/ACT inhaler INHALE 2 PUFFS INTO THE LUNGS TWICE DAILY AS NEEDED 12 g Mariana Wiederholt G, DO   predniSONE (DELTASONE) 50 MG tablet 1 tablet daily x 5 days 5 tablet Marsel Gail G, DO   doxycycline (VIBRAMYCIN) 100 MG capsule Take 1 capsule (100 mg total) by mouth 2 (two) times daily. 14 capsule Thersa Salt G, DO     PDMP not reviewed this encounter.   Coral Spikes, [place] 02/11/21 1459

## 2021-02-11 NOTE — Discharge Instructions (Signed)
Medications as prescribed. ° °Take care ° °Dr. Roslynn Holte  °

## 2021-03-10 ENCOUNTER — Other Ambulatory Visit: Payer: Self-pay | Admitting: Family Medicine

## 2021-04-02 ENCOUNTER — Ambulatory Visit (INDEPENDENT_AMBULATORY_CARE_PROVIDER_SITE_OTHER): Payer: Managed Care, Other (non HMO) | Admitting: Family

## 2021-04-02 ENCOUNTER — Other Ambulatory Visit: Payer: Self-pay

## 2021-04-02 ENCOUNTER — Encounter: Payer: Self-pay | Admitting: Family

## 2021-04-02 VITALS — BP 104/68 | HR 66 | Temp 97.6°F | Ht 66.2 in | Wt 162.0 lb

## 2021-04-02 DIAGNOSIS — G4700 Insomnia, unspecified: Secondary | ICD-10-CM | POA: Diagnosis not present

## 2021-04-02 DIAGNOSIS — Z0000 Encounter for general adult medical examination without abnormal findings: Secondary | ICD-10-CM

## 2021-04-02 DIAGNOSIS — I10 Essential (primary) hypertension: Secondary | ICD-10-CM | POA: Diagnosis not present

## 2021-04-02 DIAGNOSIS — F419 Anxiety disorder, unspecified: Secondary | ICD-10-CM | POA: Diagnosis not present

## 2021-04-02 MED ORDER — ALPRAZOLAM 0.25 MG PO TABS: 0.2500 mg | ORAL_TABLET | Freq: Every day | ORAL | 0 refills | Status: DC | PRN

## 2021-04-02 NOTE — Patient Instructions (Signed)
Please store xanax in a safe place No alcohol use with xanax Use sparingly  Have a great trip!   Health Maintenance, Female Adopting a healthy lifestyle and getting preventive care are important in promoting health and wellness. Ask your health care provider about:  The right schedule for you to have regular tests and exams.  Things you can do on your own to prevent diseases and keep yourself healthy. What should I know about diet, weight, and exercise? Eat a healthy diet  Eat a diet that includes plenty of vegetables, fruits, low-fat dairy products, and lean protein.  Do not eat a lot of foods that are high in solid fats, added sugars, or sodium.   Maintain a healthy weight Body mass index (BMI) is used to identify weight problems. It estimates body fat based on height and weight. Your health care provider can help determine your BMI and help you achieve or maintain a healthy weight. Get regular exercise Get regular exercise. This is one of the most important things you can do for your health. Most adults should:  Exercise for at least 150 minutes each week. The exercise should increase your heart rate and make you sweat (moderate-intensity exercise).  Do strengthening exercises at least twice a week. This is in addition to the moderate-intensity exercise.  Spend less time sitting. Even light physical activity can be beneficial. Watch cholesterol and blood lipids Have your blood tested for lipids and cholesterol at 50 years of age, then have this test every 5 years. Have your cholesterol levels checked more often if:  Your lipid or cholesterol levels are high.  You are older than 50 years of age.  You are at high risk for heart disease. What should I know about cancer screening? Depending on your health history and family history, you may need to have cancer screening at various ages. This may include screening for:  Breast cancer.  Cervical cancer.  Colorectal  cancer.  Skin cancer.  Lung cancer. What should I know about heart disease, diabetes, and high blood pressure? Blood pressure and heart disease  High blood pressure causes heart disease and increases the risk of stroke. This is more likely to develop in people who have high blood pressure readings, are of African descent, or are overweight.  Have your blood pressure checked: ? Every 3-5 years if you are 34-63 years of age. ? Every year if you are 37 years old or older. Diabetes Have regular diabetes screenings. This checks your fasting blood sugar level. Have the screening done:  Once every three years after age 16 if you are at a normal weight and have a low risk for diabetes.  More often and at a younger age if you are overweight or have a high risk for diabetes. What should I know about preventing infection? Hepatitis B If you have a higher risk for hepatitis B, you should be screened for this virus. Talk with your health care provider to find out if you are at risk for hepatitis B infection. Hepatitis C Testing is recommended for:  Everyone born from [birth year] through 1965.  Anyone with known risk factors for hepatitis C. Sexually transmitted infections (STIs)  Get screened for STIs, including gonorrhea and chlamydia, if: ? You are sexually active and are younger than 50 years of age. ? You are older than 50 years of age and your health care provider tells you that you are at risk for this type of infection. ? Your sexual activity has  changed since you were last screened, and you are at increased risk for chlamydia or gonorrhea. Ask your health care provider if you are at risk.  Ask your health care provider about whether you are at high risk for HIV. Your health care provider may recommend a prescription medicine to help prevent HIV infection. If you choose to take medicine to prevent HIV, you should first get tested for HIV. You should then be tested every 3 months for as long as  you are taking the medicine. Pregnancy  If you are about to stop having your period (premenopausal) and you may become pregnant, seek counseling before you get pregnant.  Take 400 to 800 micrograms (mcg) of folic acid every day if you become pregnant.  Ask for birth control (contraception) if you want to prevent pregnancy. Osteoporosis and menopause Osteoporosis is a disease in which the bones lose minerals and strength with aging. This can result in bone fractures. If you are 52 years old or older, or if you are at risk for osteoporosis and fractures, ask your health care provider if you should:  Be screened for bone loss.  Take a calcium or vitamin D supplement to lower your risk of fractures.  Be given hormone replacement therapy (HRT) to treat symptoms of menopause. Follow these instructions at home: Lifestyle  Do not use any products that contain nicotine or tobacco, such as cigarettes, e-cigarettes, and chewing tobacco. If you need help quitting, ask your health care provider.  Do not use street drugs.  Do not share needles.  Ask your health care provider for help if you need support or information about quitting drugs. Alcohol use  Do not drink alcohol if: ? Your health care provider tells you not to drink. ? You are pregnant, may be pregnant, or are planning to become pregnant.  If you drink alcohol: ? Limit how much you use to 0-1 drink a day. ? Limit intake if you are breastfeeding.  Be aware of how much alcohol is in your drink. In the [place] one drink equals one 12 oz bottle of beer (355 mL), one 5 oz glass of wine (148 mL), or one 1 oz glass of hard liquor (44 mL). General instructions  Schedule regular health, dental, and eye exams.  Stay current with your vaccines.  Tell your health care provider if: ? You often feel depressed. ? You have ever been abused or do not feel safe at home. Summary  Adopting a healthy lifestyle and getting preventive care are  important in promoting health and wellness.  Follow your health care provider's instructions about healthy diet, exercising, and getting tested or screened for diseases.  Follow your health care provider's instructions on monitoring your cholesterol and blood pressure. This information is not intended to replace advice given to you by your health care provider. Make sure you discuss any questions you have with your health care provider. Document Revised: 11/23/2018 Document Reviewed: 11/23/2018 Elsevier Patient Education  2021 [employer].

## 2021-04-02 NOTE — Assessment & Plan Note (Signed)
Overall controlled. Anxiety regarding upcoming trip. Limited supply xanax 0.25mg  I looked up patient on [place] Controlled Substances Reporting System PMP AWARE and saw no activity that raised concern of inappropriate use.

## 2021-04-02 NOTE — Progress Notes (Signed)
Subjective:    Patient ID: Briana Klein, female    DOB: 1971/05/15, 50 y.o.   MRN: [id number]  CC: Briana Klein is a 50 y.o. female who presents today for physical exam.    HPI: Feels well today Upcoming overseas trip and request xanax for 10 day trip to be used prn.  She feels anxious about travel, crowds, and not being able to sleep. She has used in the past.  No si/hi  HTN- compliant with norvasc 5mg  Insomnia- compliant with trazodone 50mg  with some relief.   Colorectal Cancer Screening: due Breast Cancer Screening: Mammogram UTD Cervical Cancer Screening: UTD last year.    Hepatitis C screening - Candidate for, consents Labs: Screening labs today. Exercise: Gets regular exercise.   Smoking/tobacco use: Nonsmoker.     HISTORY:  Past Medical History:  Diagnosis Date  . Abnormal stress test    Dr. Mancel Parsons  . Anxiety   . Asthma    inhaler  . Chronic diarrhea    history  . Hyperlipidemia   . Hypertension   . Migraines    otc med prn  . Seasonal allergies   . SVD (spontaneous vaginal delivery)    x 3  . Vitamin D deficiency     Past Surgical History:  Procedure Laterality Date  . HYSTEROSCOPY WITH NOVASURE N/A 12/12/2013   Procedure: HYSTEROSCOPY WITH NOVASURE;  Surgeon: Logan Bores, MD;  Location: [facility];  Service: Gynecology;  Laterality: N/A;   Family History  Problem Relation Age of Onset  . Hypertension Mother   . Heart disease Father   . Diabetes Father   . Stroke Father   . Heart disease Paternal Grandfather   . Breast cancer Maternal Aunt   . Colon cancer Maternal Aunt 80  . Breast cancer Paternal Grandmother   . Diabetes Other   . Melanoma Neg Hx       ALLERGIES: Amoxicillin-pot clavulanate  Current Outpatient Medications on File Prior to Visit  Medication Sig Dispense Refill  . acetaminophen (TYLENOL) 325 MG tablet Take 650 mg by mouth every 6 (six) hours as needed.    Marland Kitchen albuterol (VENTOLIN HFA) 108 (90 Base) MCG/ACT  inhaler INHALE 2 PUFFS BY MOUTH INTO THE LUNGS EVERY 6 HOURS AS NEEDED 18 g 3  . amLODipine (NORVASC) 5 MG tablet TAKE 1 TABLET(5 MG) BY MOUTH DAILY 90 tablet 1  . aspirin-acetaminophen-caffeine (EXCEDRIN MIGRAINE) 417-408-14 MG tablet Take by mouth every 6 (six) hours as needed for headache.    . fexofenadine (ALLEGRA) 180 MG tablet Take 180 mg by mouth as needed for allergies or rhinitis.    . fluticasone-salmeterol (ADVAIR HFA) 115-21 MCG/ACT inhaler INHALE 2 PUFFS INTO THE LUNGS TWICE DAILY AS NEEDED 12 g 0  . traZODone (DESYREL) 50 MG tablet TAKE 1 TABLET(50 MG) BY MOUTH AT BEDTIME AS NEEDED FOR SLEEP 90 tablet 1   No current facility-administered medications on file prior to visit.    Social History   Tobacco Use  . Smoking status: Never Smoker  . Smokeless tobacco: Never Used  Vaping Use  . Vaping Use: Never used  Substance Use Topics  . Alcohol use: Yes    Comment: social  . Drug use: No    Review of Systems  Constitutional: Negative for chills, fever and unexpected weight change.  HENT: Negative for congestion.   Respiratory: Negative for cough.   Cardiovascular: Negative for chest pain, palpitations and leg swelling.  Gastrointestinal: Negative for nausea and vomiting.  Musculoskeletal: Negative for arthralgias and myalgias.  Skin: Negative for rash.  Neurological: Negative for headaches.  Hematological: Negative for adenopathy.  Psychiatric/Behavioral: Positive for sleep disturbance. Negative for confusion. The patient is not nervous/anxious.       Objective:    BP 104/68   Pulse 66   Temp 97.6 F (36.4 C)   Ht 5' 6.2" (1.681 m)   Wt 162 lb (73.5 kg)   SpO2 98%   BMI 25.99 kg/m   BP Readings from Last 3 Encounters:  04/02/21 104/68  02/11/21 128/90  12/12/20 (!) 157/94   Wt Readings from Last 3 Encounters:  04/02/21 162 lb (73.5 kg)  02/11/21 155 lb (70.3 kg)  12/12/20 159 lb (72.1 kg)    Physical Exam Vitals reviewed.  Constitutional:       Appearance: She is well-developed.  Eyes:     Conjunctiva/sclera: Conjunctivae normal.  Neck:     Thyroid: No thyroid mass or thyromegaly.  Cardiovascular:     Rate and Rhythm: Normal rate and regular rhythm.     Pulses: Normal pulses.     Heart sounds: Normal heart sounds.  Pulmonary:     Effort: Pulmonary effort is normal.     Breath sounds: Normal breath sounds. No wheezing, rhonchi or rales.  Chest:  Breasts: Breasts are symmetrical.     Right: No inverted nipple, mass, nipple discharge, skin change or tenderness.     Left: No inverted nipple, mass, nipple discharge, skin change or tenderness.    Lymphadenopathy:     Head:     Right side of head: No submental, submandibular, tonsillar, preauricular, posterior auricular or occipital adenopathy.     Left side of head: No submental, submandibular, tonsillar, preauricular, posterior auricular or occipital adenopathy.     Cervical: No cervical adenopathy.     Right cervical: No superficial, deep or posterior cervical adenopathy.    Left cervical: No superficial, deep or posterior cervical adenopathy.  Skin:    General: Skin is warm and dry.  Neurological:     Mental Status: She is alert.  Psychiatric:        Speech: Speech normal.        Behavior: Behavior normal.        Thought Content: Thought content normal.        Assessment & Plan:   Problem List Items Addressed This Visit      Cardiovascular and Mediastinum   HTN (hypertension)    Controlled. Continue amlodipine 5mg         Other   Anxiety    Overall controlled. Anxiety regarding upcoming trip. Limited supply xanax 0.25mg  I looked up patient on [place] Controlled Substances Reporting System PMP AWARE and saw no activity that raised concern of inappropriate use.        Relevant Medications   ALPRAZolam (XANAX) 0.25 MG tablet   Insomnia    suboptimal control.advised to trial trazodone 100mg  qhs.      Routine general medical examination at a health care  facility - Primary    CBE performed. Deferred pelvic exam in the absence of complaints and pap utd. Referral for colonoscopy       Relevant Orders   Ambulatory referral to Gastroenterology   CBC with Differential/Platelet   Comprehensive metabolic panel   Hemoglobin A1c   Hepatitis C antibody   Lipid panel   TSH   VITAMIN D 25 Hydroxy (Vit-D Deficiency, Fractures)       I have discontinued Harriette D. Helbert's  predniSONE and doxycycline. I am also having her start on ALPRAZolam. Additionally, I am having her maintain her fexofenadine, acetaminophen, aspirin-acetaminophen-caffeine, amLODipine, traZODone, albuterol, and Advair HFA.   Meds ordered this encounter  Medications  . ALPRAZolam (XANAX) 0.25 MG tablet    Sig: Take 1 tablet (0.25 mg total) by mouth daily as needed for anxiety.    Dispense:  15 tablet    Refill:  0    Order Specific Question:   Supervising Provider    Answer:   Crecencio Mc [[id number]]    Return precautions given.   Risks, benefits, and alternatives of the medications and treatment plan prescribed today were discussed, and patient expressed understanding.   Education regarding symptom management and diagnosis given to patient on AVS.   Continue to follow with Burnard Hawthorne, FNP for routine health maintenance.   Dianah D Haddox and I agreed with plan.   Mable Paris, FNP

## 2021-04-02 NOTE — Assessment & Plan Note (Signed)
Controlled. Continue amlodipine 5mg 

## 2021-04-02 NOTE — Assessment & Plan Note (Signed)
CBE performed. Deferred pelvic exam in the absence of complaints and pap utd. Referral for colonoscopy

## 2021-04-02 NOTE — Assessment & Plan Note (Signed)
suboptimal control.advised to trial trazodone 100mg  qhs.

## 2021-04-03 LAB — CBC WITH DIFFERENTIAL/PLATELET
Basophils Absolute: 0.1 10*3/uL (ref 0.0–0.2)
Basos: 2 %
EOS (ABSOLUTE): 0.3 10*3/uL (ref 0.0–0.4)
Eos: 7 %
Hematocrit: 37.9 % (ref 34.0–46.6)
Hemoglobin: 13 g/dL (ref 11.1–15.9)
Immature Grans (Abs): 0 10*3/uL (ref 0.0–0.1)
Immature Granulocytes: 0 %
Lymphocytes Absolute: 1.7 10*3/uL (ref 0.7–3.1)
Lymphs: 36 %
MCH: 30.8 pg (ref 26.6–33.0)
MCHC: 34.3 g/dL (ref 31.5–35.7)
MCV: 90 fL (ref 79–97)
Monocytes Absolute: 0.5 10*3/uL (ref 0.1–0.9)
Monocytes: 10 %
Neutrophils Absolute: 2.1 10*3/uL (ref 1.4–7.0)
Neutrophils: 45 %
Platelets: 214 10*3/uL (ref 150–450)
RBC: 4.22 x10E6/uL (ref 3.77–5.28)
RDW: 14.3 % (ref 11.7–15.4)
WBC: 4.6 10*3/uL (ref 3.4–10.8)

## 2021-04-03 LAB — HEMOGLOBIN A1C
Est. average glucose Bld gHb Est-mCnc: 94 mg/dL
Hgb A1c MFr Bld: 4.9 % (ref 4.8–5.6)

## 2021-04-03 LAB — COMPREHENSIVE METABOLIC PANEL
ALT: 18 IU/L (ref 0–32)
AST: 18 IU/L (ref 0–40)
Albumin/Globulin Ratio: 1.7 (ref 1.2–2.2)
Albumin: 4.5 g/dL (ref 3.8–4.8)
Alkaline Phosphatase: 82 IU/L (ref 44–121)
BUN/Creatinine Ratio: 17 (ref 9–23)
BUN: 13 mg/dL (ref 6–24)
Bilirubin Total: 0.5 mg/dL (ref 0.0–1.2)
CO2: 24 mmol/L (ref 20–29)
Calcium: 9.4 mg/dL (ref 8.7–10.2)
Chloride: 103 mmol/L (ref 96–106)
Creatinine, Ser: 0.78 mg/dL (ref 0.57–1.00)
Globulin, Total: 2.6 g/dL (ref 1.5–4.5)
Glucose: 109 mg/dL — ABNORMAL HIGH (ref 65–99)
Potassium: 3.8 mmol/L (ref 3.5–5.2)
Sodium: 142 mmol/L (ref 134–144)
Total Protein: 7.1 g/dL (ref 6.0–8.5)
eGFR: 93 mL/min/{1.73_m2} (ref >59)

## 2021-04-03 LAB — LIPID PANEL
Chol/HDL Ratio: 3.8 ratio (ref 0.0–4.4)
Cholesterol, Total: 219 mg/dL — ABNORMAL HIGH (ref 100–199)
HDL: 58 mg/dL (ref >39)
LDL Chol Calc (NIH): 131 mg/dL — ABNORMAL HIGH (ref 0–99)
Triglycerides: 168 mg/dL — ABNORMAL HIGH (ref 0–149)
VLDL Cholesterol Cal: 30 mg/dL (ref 5–40)

## 2021-04-03 LAB — TSH: TSH: 3.68 u[IU]/mL (ref 0.450–4.500)

## 2021-04-03 LAB — HEPATITIS C ANTIBODY: Hep C Virus Ab: <0.1 s/co ratio (ref 0.0–0.9)

## 2021-04-03 LAB — VITAMIN D 25 HYDROXY (VIT D DEFICIENCY, FRACTURES): Vit D, 25-Hydroxy: 18.9 ng/mL — ABNORMAL LOW (ref 30.0–100.0)

## 2021-04-16 ENCOUNTER — Encounter: Payer: Self-pay | Admitting: *Deleted

## 2021-05-30 ENCOUNTER — Ambulatory Visit: Payer: Managed Care, Other (non HMO) | Admitting: Cardiology

## 2021-08-22 ENCOUNTER — Other Ambulatory Visit: Payer: Self-pay | Admitting: Family

## 2021-08-22 DIAGNOSIS — I10 Essential (primary) hypertension: Secondary | ICD-10-CM

## 2021-09-09 ENCOUNTER — Ambulatory Visit: Payer: Managed Care, Other (non HMO) | Admitting: Dermatology

## 2021-09-09 ENCOUNTER — Other Ambulatory Visit: Payer: Self-pay

## 2021-09-09 DIAGNOSIS — L821 Other seborrheic keratosis: Secondary | ICD-10-CM

## 2021-09-09 DIAGNOSIS — D489 Neoplasm of uncertain behavior, unspecified: Secondary | ICD-10-CM

## 2021-09-09 DIAGNOSIS — L817 Pigmented purpuric dermatosis: Secondary | ICD-10-CM

## 2021-09-09 DIAGNOSIS — D225 Melanocytic nevi of trunk: Secondary | ICD-10-CM

## 2021-09-09 DIAGNOSIS — Z1283 Encounter for screening for malignant neoplasm of skin: Secondary | ICD-10-CM

## 2021-09-09 DIAGNOSIS — D485 Neoplasm of uncertain behavior of skin: Secondary | ICD-10-CM | POA: Diagnosis not present

## 2021-09-09 DIAGNOSIS — D2271 Melanocytic nevi of right lower limb, including hip: Secondary | ICD-10-CM

## 2021-09-09 DIAGNOSIS — D1800 Hemangioma unspecified site: Secondary | ICD-10-CM

## 2021-09-09 DIAGNOSIS — D2272 Melanocytic nevi of left lower limb, including hip: Secondary | ICD-10-CM

## 2021-09-09 DIAGNOSIS — L570 Actinic keratosis: Secondary | ICD-10-CM

## 2021-09-09 DIAGNOSIS — L578 Other skin changes due to chronic exposure to nonionizing radiation: Secondary | ICD-10-CM

## 2021-09-09 DIAGNOSIS — L814 Other melanin hyperpigmentation: Secondary | ICD-10-CM

## 2021-09-09 DIAGNOSIS — D229 Melanocytic nevi, unspecified: Secondary | ICD-10-CM

## 2021-09-09 NOTE — Patient Instructions (Addendum)
Biopsy Wound Care Instructions  Leave the original bandage on for 24 hours if possible.  If the bandage becomes soaked or soiled before that time, it is OK to remove it and examine the wound.  A small amount of post-operative bleeding is normal.  If excessive bleeding occurs, remove the bandage, place gauze over the site and apply continuous pressure (no peeking) over the area for 30 minutes. If this does not work, please call our clinic as soon as possible or page your doctor if it is after hours.   Once a day, cleanse the wound with soap and water. It is fine to shower. If a thick crust develops you may use a Q-tip dipped into dilute hydrogen peroxide (mix 1:1 with water) to dissolve it.  Hydrogen peroxide can slow the healing process, so use it only as needed.    After washing, apply petroleum jelly (Vaseline) or an antibiotic ointment if your doctor prescribed one for you, followed by a bandage.    For best healing, the wound should be covered with a layer of ointment at all times. If you are not able to keep the area covered with a bandage to hold the ointment in place, this may mean re-applying the ointment several times a day.  Continue this wound care until the wound has healed and is no longer open.   Itching and mild discomfort is normal during the healing process. However, if you develop pain or severe itching, please call our office.   If you have any discomfort, you can take Tylenol (acetaminophen) or ibuprofen as directed on the bottle. (Please do not take these if you have an allergy to them or cannot take them for another reason).  Some redness, tenderness and white or yellow material in the wound is normal healing.  If the area becomes very sore and red, or develops a thick yellow-green material (pus), it may be infected; please notify us.    If you have stitches, return to clinic as directed to have the stitches removed. You will continue wound care for 2-3 days after the stitches  are removed.   Wound healing continues for up to one year following surgery. It is not unusual to experience pain in the scar from time to time during the interval.  If the pain becomes severe or the scar thickens, you should notify the office.    A slight amount of redness in a scar is expected for the first six months.  After six months, the redness will fade and the scar will soften and fade.  The color difference becomes less noticeable with time.  If there are any problems, return for a post-op surgery check at your earliest convenience.  To improve the appearance of the scar, you can use silicone scar gel, cream, or sheets (such as Mederma or Serica) every night for up to one year. These are available over the counter (without a prescription).  Please call our office at [phone] for any questions or concerns.    Actinic keratoses are precancerous spots that appear secondary to cumulative UV radiation exposure/sun exposure over time. They are chronic with expected duration over 1 year. A portion of actinic keratoses will progress to squamous cell carcinoma of the skin. It is not possible to reliably predict which spots will progress to skin cancer and so treatment is recommended to prevent development of skin cancer.  Recommend daily broad spectrum sunscreen SPF 30+ to sun-exposed areas, reapply every 2 hours as needed.    as needed.  Recommend staying in the shade or wearing long sleeves, sun glasses (UVA+UVB protection) and wide brim hats (4-inch brim around the entire circumference of the hat). Call for new or changing lesions.   Cryotherapy Aftercare  Wash gently with soap and water everyday.   Apply Vaseline and Band-Aid daily until healed.      Melanoma ABCDEs  Melanoma is the most dangerous type of skin cancer, and is the leading cause of death from skin disease.  You are more likely to develop melanoma if you: Have light-colored skin, light-colored eyes, or red or blond  hair Spend a lot of time in the sun Tan regularly, either outdoors or in a tanning bed Have had blistering sunburns, especially during childhood Have a close family member who has had a melanoma Have atypical moles or large birthmarks  Early detection of melanoma is key since treatment is typically straightforward and cure rates are extremely high if we catch it early.   The first sign of melanoma is often a change in a mole or a new dark spot.  The ABCDE system is a way of remembering the signs of melanoma.  A for asymmetry:  The two halves do not match. B for border:  The edges of the growth are irregular. C for color:  A mixture of colors are present instead of an even brown color. D for diameter:  Melanomas are usually (but not always) greater than 32mm - the size of a pencil eraser. E for evolution:  The spot keeps changing in size, shape, and color.  Please check your skin once per month between visits. You can use a small mirror in front and a large mirror behind you to keep an eye on the back side or your body.   If you see any new or changing lesions before your next follow-up, please call to schedule a visit.  Please continue daily skin protection including broad spectrum sunscreen SPF 30+ to sun-exposed areas, reapplying every 2 hours as needed when you're outdoors.   Staying in the shade or wearing long sleeves, sun glasses (UVA+UVB protection) and wide brim hats (4-inch brim around the entire circumference of the hat) are also recommended for sun protection.    If you have any questions or concerns for your doctor, please call our main line at [phone] and press option 4 to reach your doctor's medical assistant. If no one answers, please leave a voicemail as directed and we will return your call as soon as possible. Messages left after 4 pm will be answered the following business day.   You may also send [place] a message via [place]. We typically respond to MyChart messages  within 1-2 business days.  For prescription refills, please ask your pharmacy to contact our office. Our fax number is [phone].  If you have an urgent issue when the clinic is closed that cannot wait until the next business day, you can page your doctor at the number below.    Please note that while we do our best to be available for urgent issues outside of office hours, we are not available 24/7.   If you have an urgent issue and are unable to reach [place], you may choose to seek medical care at your doctor's office, retail clinic, urgent care center, or emergency room.  If you have a medical emergency, please immediately call 911 or go to the emergency department.  Pager Numbers  - Dr. Nehemiah Massed: [phone]  - Dr. Laurence Ferrari:  [phone]  - Dr. Nicole Kindred: [phone]  In the event of inclement weather, please call our main line at [phone] for an update on the status of any delays or closures.  Dermatology Medication Tips: Please keep the boxes that topical medications come in in order to help keep track of the instructions about where and how to use these. Pharmacies typically print the medication instructions only on the boxes and not directly on the medication tubes.   If your medication is too expensive, please contact our office at [phone] option 4 or send [place] a message through [place].   We are unable to tell what your co-pay for medications will be in advance as this is different depending on your insurance coverage. However, we may be able to find a substitute medication at lower cost or fill out paperwork to get insurance to cover a needed medication.   If a prior authorization is required to get your medication covered by your insurance company, please allow [place] 1-2 business days to complete this process.  Drug prices often vary depending on where the prescription is filled and some pharmacies may offer cheaper prices.  The website www.goodrx.com contains coupons for  medications through different pharmacies. The prices here do not account for what the cost may be with help from insurance (it may be cheaper with your insurance), but the website can give you the price if you did not use any insurance.  - You can print the associated coupon and take it with your prescription to the pharmacy.  - You may also stop by our office during regular business hours and pick up a GoodRx coupon card.  - If you need your prescription sent electronically to a different pharmacy, notify our office through [facility] or by phone at [phone] option 4.

## 2021-09-09 NOTE — Progress Notes (Addendum)
Follow-Up Visit   Subjective  Briana Klein is a 50 y.o. female who presents for the following: Follow-up (Patient here today for tbse. She reports no areas or spots of concern today. ). She has a lot of moles, none are changing that she has noticed.  She has a red mole on R side that get irritated and pulled by clothing that she would like removed.  Patient here for full body skin exam and skin cancer screening.   The following portions of the chart were reviewed this encounter and updated as appropriate:      Review of Systems: No other skin or systemic complaints except as noted in HPI or Assessment and Plan.   Objective  Well appearing patient in no apparent distress; mood and affect are within normal limits.  A full examination was performed including scalp, head, eyes, ears, nose, lips, neck, chest, axillae, abdomen, back, buttocks, bilateral upper extremities, bilateral lower extremities, hands, feet, fingers, toes, fingernails, and toenails. All findings within normal limits unless otherwise noted below.  Left Flank 6 x 4 mm speckled brown macule   right mid abdomen 1.3 x 0.8 cm reg brown patch   right lateral calf 4 mm med dark brown macule   left medial ankle 6 mm med dark brown macule   right posterior flank 8 x 5 mm brown macule with darker edge       Right Flank 4 mm red papule        Right Lower Leg - Anterior Cayenne pepper macules at lower legs   Glabella x 1 Keratotic papule  Assessment & Plan  Nevus (5) right lateral calf; left medial ankle; right mid abdomen; Left Flank; right posterior flank  Benign-appearing.  Observation.  Call clinic for new or changing lesions.  Recommend daily use of broad spectrum spf 30+ sunscreen to sun-exposed areas.   Compared with photos taken prior and stable   Neoplasm of uncertain behavior Right Flank  Epidermal / dermal shaving  Lesion diameter (cm):  0.5 Informed consent: discussed and  consent obtained   Patient was prepped and draped in usual sterile fashion: Area prepped with alcohol. Anesthesia: the lesion was anesthetized in a standard fashion   Anesthetic:  1% lidocaine w/ epinephrine 1-100,000 buffered w/ 8.4% NaHCO3 Instrument used: flexible razor blade   Hemostasis achieved with: pressure, aluminum chloride and electrodesiccation   Outcome: patient tolerated procedure well   Post-procedure details: wound care instructions given   Post-procedure details comment:  Ointment and small bandage applied.   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   Recommend compression stockings to prevent leg swelling     Actinic keratosis Glabella x 1  Actinic keratoses are precancerous spots that appear secondary to cumulative UV radiation exposure/sun exposure over time. They are chronic with expected duration over 1 year. A portion of actinic keratoses will progress to squamous cell carcinoma of the skin. It is not possible to reliably predict which spots will progress to skin cancer and so treatment is recommended to prevent development of skin cancer.  Recommend daily broad spectrum sunscreen SPF 30+ to sun-exposed areas, reapply every 2 hours as needed.  Recommend staying in the shade or wearing long sleeves, sun glasses (UVA+UVB protection) and wide brim hats (4-inch brim around the entire circumference of the hat). Call for new or changing lesions.  Destruction of lesion -  Glabella x 1  Destruction method: cryotherapy   Informed consent: discussed and consent obtained   Lesion destroyed using liquid nitrogen: Yes   Region frozen until ice ball extended beyond lesion: Yes   Outcome: patient tolerated procedure well with no complications   Post-procedure details: wound care instructions given   Additional details:  Prior to procedure, discussed risks of  blister formation, small wound, skin dyspigmentation, or rare scar following cryotherapy. Recommend Vaseline ointment to treated areas while healing.  Lentigines - Scattered tan macules - Due to sun exposure - Benign-appearing, observe - Recommend daily broad spectrum sunscreen SPF 30+ to sun-exposed areas, reapply every 2 hours as needed. - Call for any changes  Seborrheic Keratoses - Stuck-on, waxy, tan-brown papules and/or plaques left mid back at bra line - Benign-appearing - Discussed benign etiology and prognosis. - Observe - Call for any changes  Melanocytic Nevi - Tan-brown and/or pink-flesh-colored symmetric macules and papules - Benign appearing on exam today - Observation - Call clinic for new or changing moles - Recommend daily use of broad spectrum spf 30+ sunscreen to sun-exposed areas.   Hemangiomas - Red papules - Discussed benign nature - Observe - Call for any changes  Actinic Damage - Chronic condition, secondary to cumulative UV/sun exposure - diffuse scaly erythematous macules with underlying dyspigmentation - Recommend daily broad spectrum sunscreen SPF 30+ to sun-exposed areas, reapply every 2 hours as needed.  - Staying in the shade or wearing long sleeves, sun glasses (UVA+UVB protection) and wide brim hats (4-inch brim around the entire circumference of the hat) are also recommended for sun protection.  - Call for new or changing lesions.  Skin cancer screening performed today.  Return in about 1 year (around 09/09/2022) for tbse. I, Ruthell Rummage, CMA, am acting as scribe for Brendolyn Patty, MD.  Documentation: I have reviewed the above documentation for accuracy and completeness, and I agree with the above.  Brendolyn Patty MD

## 2021-09-18 ENCOUNTER — Other Ambulatory Visit: Payer: Self-pay | Admitting: Family

## 2021-09-18 DIAGNOSIS — G4700 Insomnia, unspecified: Secondary | ICD-10-CM

## 2021-09-19 LAB — ANATOMIC PATHOLOGY REPORT

## 2021-09-22 ENCOUNTER — Telehealth: Payer: Self-pay

## 2021-09-22 NOTE — Telephone Encounter (Signed)
-----   Message from Brendolyn Patty, MD sent at 09/22/2021  1:01 PM EDT ----- Benign hemangioma - please call patient

## 2021-09-22 NOTE — Telephone Encounter (Signed)
Left pt msg advising of bx results/sh

## 2022-02-11 ENCOUNTER — Other Ambulatory Visit: Payer: Self-pay

## 2022-02-11 ENCOUNTER — Ambulatory Visit
Admission: EM | Admit: 2022-02-11 | Discharge: 2022-02-11 | Disposition: A | Discharge status: Other Acute Inpatient Hospital | Payer: Managed Care, Other (non HMO)

## 2022-02-18 ENCOUNTER — Other Ambulatory Visit: Payer: Self-pay

## 2022-02-18 ENCOUNTER — Telehealth: Payer: Self-pay

## 2022-02-18 ENCOUNTER — Telehealth: Payer: Self-pay | Admitting: Family

## 2022-02-18 ENCOUNTER — Ambulatory Visit: Payer: Managed Care, Other (non HMO) | Admitting: Family

## 2022-02-18 ENCOUNTER — Encounter: Payer: Self-pay | Admitting: Family

## 2022-02-18 VITALS — BP 128/70 | HR 68 | Temp 96.7°F | Ht 67.0 in | Wt 164.2 lb

## 2022-02-18 DIAGNOSIS — G4700 Insomnia, unspecified: Secondary | ICD-10-CM

## 2022-02-18 DIAGNOSIS — R109 Unspecified abdominal pain: Secondary | ICD-10-CM | POA: Insufficient documentation

## 2022-02-18 DIAGNOSIS — R1084 Generalized abdominal pain: Secondary | ICD-10-CM

## 2022-02-18 DIAGNOSIS — I10 Essential (primary) hypertension: Secondary | ICD-10-CM

## 2022-02-18 MED ORDER — AMLODIPINE BESYLATE 5 MG PO TABS: ORAL_TABLET | ORAL | 1 refills | Status: DC

## 2022-02-18 MED ORDER — TRAZODONE HCL 50 MG PO TABS: ORAL_TABLET | ORAL | 1 refills | Status: DC

## 2022-02-18 NOTE — Patient Instructions (Addendum)
Urgent referral has been placed to gastroenterology.  Please call me tomorrow and let me know if you have not heard from them.  Please  continue ciprofloxacin, metronidazole for now.   ? ?Please let me know if any worsening of your symptoms or development of new symptoms ? ?Mammogram is ordered ? ?Please schedule your mammogram at [facility]. I have already ordered your mammogram ?  ?[facility] ?[id number] ? [place], [place] [address] ?Phone: [phone] ? ?Ensure to take probiotics while on antibiotics and also for 2 weeks after completion. This can either be by eating yogurt daily or taking a probiotic supplement over the counter such as Culturelle.It is important to re-colonize the gut with good bacteria and also to prevent any diarrheal infections associated with antibiotic use.  ? ? ?

## 2022-02-18 NOTE — Telephone Encounter (Signed)
The test PCP ordered for here to [employer] covers E-coli confirmed through [employer]. Patient coming here for stool and labs. ?

## 2022-02-18 NOTE — Telephone Encounter (Signed)
Lft pt vm to call ofc to sch [place] ab. thanks ?

## 2022-02-18 NOTE — Telephone Encounter (Signed)
Scheduled for 06/02/2022 ?

## 2022-02-18 NOTE — Progress Notes (Signed)
Subjective:    Patient ID: [id number], female    DOB: 01-Jun-1971, 51 y.o.   MRN: [id number]  CC: Briana Klein is a 51 y.o. female who presents today to establish care.    HPI: Follow up ED at [facility] 02/11/22  Complains of 'a little' abdominal discomfort which is generalized across abdomen which started 7 days ago.  Started with abdominal bloating which intensified. One month ago she desribes epigastric cramping which 'was worse' than current episode, this resolved on its own in 5 days time.  Abdominal cramping and bloating has improved since being seen in ED.  Cramping worsens with eating even with bland diet. She is eating gluten Continues to have non bloody very loose  diarrhea x one month, improved from watery brown.  Two episodes loose stool per day.  No antibiotics prior to being seen at [facility] ED.   No fever, hematuria, vomiting.        Presented to the emergency room for bilateral lower abdominal cramping.  Nonbloody diarrhea. 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  No abscess or perforation.  Fluid-filled small bowel, nonspecific specific and can be seen enteritis.  CT showed tiny hypodensities in left hepatic lobe.  No biliary ductal dilatation.  Nonobstructing calcification left lower kidney.  Normal appendix.  No adenopathy.  No aggressive osseous lesions. She was sent home on ciprofloxacin 500 mg twice daily x7 days.  She was sent home on metronidazole 500 mg 3 times daily x7 days She  returned to [facility] emergency room 02/12/2022.  Per notes, CT abdomen and pelvis had an over read of a dilated appendix of 14 mm with question of acute appendicitis. White blood cells 5.1.  Per notes reviewed with surgery and felt more likely consistent with enteritis rather than appendicitis.  Recommended considering admission for serial abdominal  exams.  Colonoscopy is due History of nephrolithiasis, asthma, hypertension  HISTORY:  Past Medical History:  Diagnosis Date   Abnormal stress test    Dr. Mancel Parsons   Anxiety    Asthma    inhaler   Chronic diarrhea    history   Hyperlipidemia    Hypertension    Migraines    otc med prn   Seasonal allergies    SVD (spontaneous vaginal delivery)    x 3   Vitamin D deficiency    Past Surgical History:  Procedure Laterality Date   HYSTEROSCOPY WITH NOVASURE N/A 12/12/2013   Procedure: HYSTEROSCOPY WITH NOVASURE;  Surgeon: Logan Bores, MD;  Location: [facility];  Service: Gynecology;  Laterality: N/A;   Family History  Problem Relation Age of Onset   Hypertension Mother    Heart disease Father    Diabetes Father    Stroke Father    Heart disease Paternal Grandfather    Breast cancer Maternal Aunt    Colon cancer Maternal Aunt 80   Breast cancer Paternal Grandmother    Diabetes Other    Melanoma Neg Hx     Allergies: Amoxicillin-pot clavulanate Current Outpatient Medications on File Prior to Visit  Medication Sig Dispense Refill   acetaminophen (TYLENOL) 325 MG tablet Take 650 mg by mouth every 6 (six) hours as needed.     albuterol (VENTOLIN HFA) 108 (90 Base) MCG/ACT inhaler INHALE 2 PUFFS BY MOUTH INTO THE LUNGS EVERY 6 HOURS AS NEEDED 18 g 3   ALPRAZolam (XANAX) 0.25 MG  tablet Take 1 tablet (0.25 mg total) by mouth daily as needed for anxiety. 15 tablet 0   aspirin-acetaminophen-caffeine (EXCEDRIN MIGRAINE) 989-211-94 MG tablet Take by mouth every 6 (six) hours as needed for headache.     fexofenadine (ALLEGRA) 180 MG tablet Take 180 mg by mouth as needed for allergies or rhinitis.     fluticasone-salmeterol (ADVAIR HFA) 115-21 MCG/ACT inhaler INHALE 2 PUFFS INTO THE LUNGS TWICE DAILY AS NEEDED 12 g 0   No current facility-administered medications on file prior to visit.    Social History   Tobacco Use   Smoking status: Never   Smokeless tobacco: Never   Vaping Use   Vaping Use: Never used  Substance Use Topics   Alcohol use: Yes    Comment: social   Drug use: No    Review of Systems  Constitutional:  Negative for chills and fever.  Respiratory:  Negative for cough.   Cardiovascular:  Negative for chest pain and palpitations.  Gastrointestinal:  Positive for abdominal pain and diarrhea. Negative for anal bleeding, blood in stool, nausea and vomiting.  Genitourinary:  Negative for flank pain.     Objective:    BP 128/70 (BP Location: Left Arm, Patient Position: Sitting, Cuff Size: Normal)    Pulse 68    Temp (!) 96.7 F (35.9 C) (Temporal)    Ht '5\' 7"'$  (1.702 m)    Wt 164 lb 3.2 oz (74.5 kg)    LMP 02/05/2022 (Exact Date)    SpO2 99%    BMI 25.72 kg/m  BP Readings from Last 3 Encounters:  02/18/22 128/70  04/02/21 104/68  02/11/21 128/90   Wt Readings from Last 3 Encounters:  02/18/22 164 lb 3.2 oz (74.5 kg)  04/02/21 162 lb (73.5 kg)  02/11/21 155 lb (70.3 kg)    Physical Exam Vitals reviewed.  Constitutional:      Appearance: Normal appearance. She is well-developed.  Eyes:     Conjunctiva/sclera: Conjunctivae normal.  Cardiovascular:     Rate and Rhythm: Normal rate and regular rhythm.     Pulses: Normal pulses.     Heart sounds: Normal heart sounds.  Pulmonary:     Effort: Pulmonary effort is normal.     Breath sounds: Normal breath sounds. No wheezing, rhonchi or rales.  Abdominal:     General: Bowel sounds are normal. There is no distension.     Palpations: Abdomen is soft. Abdomen is not rigid. There is no fluid wave or mass.     Tenderness: There is generalized abdominal tenderness. There is no right CVA tenderness, left CVA tenderness, guarding or rebound. Negative signs include McBurney's sign.     Comments: She was not guarding during exam.  No rebound  Skin:    General: Skin is warm and dry.  Neurological:     Mental Status: She is alert.  Psychiatric:        Speech: Speech normal.        Behavior:  Behavior normal.        Thought Content: Thought content normal.       Assessment & Plan:   Problem List Items Addressed This Visit       Other   Abdominal pain - Primary    Reviewed [facility] ED course with patient in detail . patient is nontoxic in appearance diffuse tenderness on exam, nonfocal.  She is afebrile.  Differential includes  gastroenteritis, appendicitis, diverticulitis.  Consulted with gastroenterology, Dr. Allen Norris, on the phone who was willing  to work patient into clinic, appointment scheduled 02/26/22. I also spoke with general surgery Dr Job Founds whom advised to order cbc with diff and sed rate and if normal would be reassuring regarding appendicitis. She will complete metronidazole, ciprofloxacin.  Advise probiotics. pending urine studies, celiac labs, H. pylori breath test, stool pathogen.  Advised patient to maintain close vigilance for any worsening of symptoms or certainly if she becomes febrile , abdominal pain worsens, to return to emergency room for further in person evaluation.      Relevant Orders   US Abdomen Limited RUQ (LIVER/GB)   H. pylori breath test (Completed)   Celiac Disease Ab Screen w/Rfx (Completed)   Ambulatory referral to Gastroenterology   Urinalysis, Routine w reflex microscopic (Completed)   Urine Culture (Completed)   Stool Culture   CBC with Differential/Platelet (Completed)   Sedimentation rate (Completed)   GI Profile, Stool, PCR   Insomnia   Relevant Medications   traZODone (DESYREL) 50 MG tablet   Other Visit Diagnoses     Essential hypertension       Relevant Medications   amLODipine (NORVASC) 5 MG tablet        I am having Glendola D. Sindt maintain her fexofenadine, acetaminophen, aspirin-acetaminophen-caffeine, albuterol, Advair HFA, ALPRAZolam, amLODipine, and traZODone.   Meds ordered this encounter  Medications   amLODipine (NORVASC) 5 MG tablet    Sig: TAKE 1 TABLET(5 MG) BY MOUTH DAILY    Dispense:  90 tablet     Refill:  1    Order Specific Question:   Supervising Provider    Answer:   Deborra Medina L [[id number]]   traZODone (DESYREL) 50 MG tablet    Sig: TAKE 1 TABLET(50 MG) BY MOUTH AT BEDTIME AS NEEDED FOR SLEEP    Dispense:  90 tablet    Refill:  1    Order Specific Question:   Supervising Provider    Answer:   Crecencio Mc [[id number]]    Return precautions given.   Risks, benefits, and alternatives of the medications and treatment plan prescribed today were discussed, and patient expressed understanding.   Education regarding symptom management and diagnosis given to patient on AVS.  Continue to follow with Burnard Hawthorne, FNP for routine health maintenance.   Rabab D Bessey and I agreed with plan.   Mable Paris, FNP  I have spent 40 minutes with a patient including precharting, exam, reviewing medical records, consulting with gastroenterology, general surgery and discussion plan of care.

## 2022-02-18 NOTE — Telephone Encounter (Signed)
Call pt ?I just tried to call her and left a message.  I did speak with general surgery as well this afternoon.  Dr Bary Castilla is going to review CT abdomen to see if feeling is more appendicitis.  He advised that I order a CBC with differential and sedimentation rate.  I ordered these labs via [facility].  Please have her come tomorrow to have these done. ?Please make her aware that I was unable to find a [facility] panel.  That is the stool that looks for E. coli.  If she would like me to obtain this which I strongly encouraged her to do so, she would need to return to [place].   ?The stool culture which I ordered can be returned here and we can result it to [place]. ?

## 2022-02-19 ENCOUNTER — Telehealth: Payer: Self-pay

## 2022-02-19 ENCOUNTER — Encounter: Payer: Self-pay | Admitting: Family

## 2022-02-19 ENCOUNTER — Other Ambulatory Visit: Payer: Self-pay

## 2022-02-19 ENCOUNTER — Other Ambulatory Visit (INDEPENDENT_AMBULATORY_CARE_PROVIDER_SITE_OTHER): Payer: Managed Care, Other (non HMO)

## 2022-02-19 DIAGNOSIS — R1084 Generalized abdominal pain: Secondary | ICD-10-CM

## 2022-02-19 LAB — URINALYSIS, ROUTINE W REFLEX MICROSCOPIC
Bilirubin, UA: NEGATIVE
Glucose, UA: NEGATIVE
Ketones, UA: NEGATIVE
Nitrite, UA: POSITIVE — AB
Specific Gravity, UA: 1.027 (ref 1.005–1.030)
Urobilinogen, Ur: 0.2 mg/dL (ref 0.2–1.0)
pH, UA: 5 (ref 5.0–7.5)

## 2022-02-19 LAB — MICROSCOPIC EXAMINATION
Bacteria, UA: NONE SEEN
Casts: NONE SEEN /lpf

## 2022-02-19 LAB — H. PYLORI BREATH TEST: H. pylori Breath Test: NOT DETECTED

## 2022-02-19 NOTE — Telephone Encounter (Signed)
Patient is on the schedule for 3pm today, that was the only appointment available. ?

## 2022-02-19 NOTE — Telephone Encounter (Signed)
Lmtcb to see if patient can come in earlier for labs ?

## 2022-02-19 NOTE — Telephone Encounter (Signed)
Briana Klein ? ?Ginger,  ? ?We were on secure chat yesterday with Dr Allen Norris for stat referral . Dr Allen Norris  wanted this pt to be worked in. We had agreed next week ( earlier the better) ? ?She is currently scheduled for June ? ?Are you able to reach out to patient and move appointment?  ? ? ?

## 2022-02-19 NOTE — Telephone Encounter (Signed)
Briana Klein I had scheduled her labs as well  and yes Briana Klein it cover other pathogens. ?

## 2022-02-19 NOTE — Telephone Encounter (Signed)
Thanks [employer]. Sounds like you confirmed that pt can have stool culture here which will look for e coli. Does [facility] stool culture look for other gi pathogens beside e coli? ? ? ?Jenate, she needs labs scheduled asap today , CBC and Sed rate. I dont see her on lab schedule. Please call her and have her come in the morning.  ? ? ? ? ?

## 2022-02-20 ENCOUNTER — Other Ambulatory Visit: Payer: Self-pay | Admitting: Family

## 2022-02-20 DIAGNOSIS — R319 Hematuria, unspecified: Secondary | ICD-10-CM

## 2022-02-20 LAB — CBC WITH DIFFERENTIAL/PLATELET
Basophils Absolute: 0.1 10*3/uL (ref 0.0–0.2)
Basos: 2 %
EOS (ABSOLUTE): 0.2 10*3/uL (ref 0.0–0.4)
Eos: 4 %
Hematocrit: 39.9 % (ref 34.0–46.6)
Hemoglobin: 13.8 g/dL (ref 11.1–15.9)
Immature Grans (Abs): 0 10*3/uL (ref 0.0–0.1)
Immature Granulocytes: 0 %
Lymphocytes Absolute: 1.8 10*3/uL (ref 0.7–3.1)
Lymphs: 38 %
MCH: 30.9 pg (ref 26.6–33.0)
MCHC: 34.6 g/dL (ref 31.5–35.7)
MCV: 90 fL (ref 79–97)
Monocytes Absolute: 0.4 10*3/uL (ref 0.1–0.9)
Monocytes: 8 %
Neutrophils Absolute: 2.3 10*3/uL (ref 1.4–7.0)
Neutrophils: 48 %
Platelets: 242 10*3/uL (ref 150–450)
RBC: 4.46 x10E6/uL (ref 3.77–5.28)
RDW: 14 % (ref 11.7–15.4)
WBC: 4.8 10*3/uL (ref 3.4–10.8)

## 2022-02-20 LAB — URINE CULTURE: Organism ID, Bacteria: NO GROWTH

## 2022-02-20 LAB — CELIAC DISEASE AB SCREEN W/RFX
Antigliadin Abs, IgA: 3 units (ref 0–19)
IgA/Immunoglobulin A, Serum: 137 mg/dL (ref 87–352)
Transglutaminase IgA: <2 U/mL (ref 0–3)

## 2022-02-20 LAB — SEDIMENTATION RATE: Sed Rate: 9 mm/hr (ref 0–40)

## 2022-02-20 NOTE — Assessment & Plan Note (Addendum)
Reviewed [facility] ED course with patient in detail . patient is nontoxic in appearance diffuse tenderness on exam, nonfocal.  She is afebrile.  Differential includes  gastroenteritis, appendicitis, diverticulitis.  Consulted with gastroenterology, Dr. Allen Norris, on the phone who was willing to work patient into clinic, appointment scheduled 02/26/22. I also spoke with general surgery Dr Job Founds whom advised to order cbc with diff and sed rate and if normal would be reassuring regarding appendicitis. She will complete metronidazole, ciprofloxacin.  Advise probiotics. pending urine studies, celiac labs, H. pylori breath test, stool pathogen.  Advised patient to maintain close vigilance for any worsening of symptoms or certainly if she becomes febrile , abdominal pain worsens, to return to emergency room for further in person evaluation. ?

## 2022-02-21 ENCOUNTER — Other Ambulatory Visit: Payer: Self-pay | Admitting: Family

## 2022-02-21 DIAGNOSIS — I10 Essential (primary) hypertension: Secondary | ICD-10-CM

## 2022-02-23 ENCOUNTER — Other Ambulatory Visit: Payer: Self-pay

## 2022-02-23 ENCOUNTER — Ambulatory Visit
Admission: RE | Admit: 2022-02-23 | Discharge: 2022-02-23 | Disposition: A | Discharge status: Home / Self Care | Payer: Managed Care, Other (non HMO) | Source: Ambulatory Visit | Attending: Family | Admitting: Family

## 2022-02-23 DIAGNOSIS — R1084 Generalized abdominal pain: Secondary | ICD-10-CM | POA: Diagnosis present

## 2022-02-23 LAB — GI PROFILE, STOOL, PCR

## 2022-02-26 ENCOUNTER — Other Ambulatory Visit: Payer: Self-pay

## 2022-02-26 ENCOUNTER — Encounter: Payer: Self-pay | Admitting: Gastroenterology

## 2022-02-26 ENCOUNTER — Ambulatory Visit: Payer: Managed Care, Other (non HMO) | Admitting: Gastroenterology

## 2022-02-26 VITALS — BP 153/92 | HR 67 | Temp 98.5°F | Ht 66.5 in | Wt 160.0 lb

## 2022-02-26 DIAGNOSIS — K388 Other specified diseases of appendix: Secondary | ICD-10-CM | POA: Diagnosis not present

## 2022-02-26 NOTE — Patient Instructions (Signed)
If you need to cancel or reschedule the CT scan, you can call them directly at [phone] ?

## 2022-02-26 NOTE — Progress Notes (Signed)
? ? ?Gastroenterology Consultation ? ?Referring Provider:     Burnard Hawthorne, FNP ?Primary Care Physician:  Burnard Hawthorne, FNP ?Primary Gastroenterologist:  Dr. Allen Norris     ?Reason for Consultation:     Abdominal pain ?      ? HPI:   ?Briana Klein is a 51 y.o. y/o female referred for consultation & management of abdominal pain by Dr. Vidal Schwalbe, Yvetta Coder, FNP.  This patient comes in today after being seen in the ER at [facility] for abdominal pain in the right and left lower abdomen that radiated to her back.  The patient had a CT scan that showed: ? ?Impression ? ?- Subtle inflammatory change around a single inflamed diverticulum within a loop of bowel in the left lower quadrant, likely distal descending colon, favored to represent acute diverticulitis. No abscess or perforation.  ? ?- Fluid-filled small bowel, nonspecific, and can be seen in enteritis.  ? ?- Trace fluid within the left paracolic gutter.  ? ?- Dilated thick-walled appendix with minimal periappendiceal stranding. Differential includes acute appendicitis and appendiceal mass with the former favored.  ? ?- Additional findings as noted above.  ? ?====================  ?++++++++++++++++++++  ?ADDENDUM: (02/12/2022 11:18 AM)  ?On review, the following additional findings were noted:  ? ?Dilated (1.4 cm) thick-walled appendix with minimal periappendiceal stranding. Differential includes acute appendicitis and appendiceal mass with the former favored.  ? ?The patient was follow-up with her primary care provider and continues to have abdominal pain and was sent to me for evaluation.  When evaluated by her primary care provider the patient reported that she was feeling better but not back to normal and was having abdominal cramps every time she ate.  The patient's primary care provider not only contacted me but contacted Dr. Bary Castilla from surgery.  The patient's white cell count was normal and her stool GI panel was negative. ? ?The patient reports that she  continues to have abdominal discomfort although she is not having any fevers or chills but still has loose bowel movement.  The patient denies any unexplained weight loss fevers chills nausea or vomiting per ? ?Past Medical History:  ?Diagnosis Date  ? Abnormal stress test   ? Dr. Mancel Parsons  ? Anxiety   ? Asthma   ? inhaler  ? Chronic diarrhea   ? history  ? Hyperlipidemia   ? Hypertension   ? Migraines   ? otc med prn  ? Seasonal allergies   ? SVD (spontaneous vaginal delivery)   ? x 3  ? Vitamin D deficiency   ? ? ?Past Surgical History:  ?Procedure Laterality Date  ? HYSTEROSCOPY WITH NOVASURE N/A 12/12/2013  ? Procedure: HYSTEROSCOPY WITH NOVASURE;  Surgeon: Logan Bores, MD;  Location: [facility];  Service: Gynecology;  Laterality: N/A;  ? ? ?Prior to Admission medications   ?Medication Sig Start Date End Date Taking? Authorizing Provider  ?acetaminophen (TYLENOL) 325 MG tablet Take 650 mg by mouth every 6 (six) hours as needed.    [provider]  ?albuterol (VENTOLIN HFA) 108 (90 Base) MCG/ACT inhaler INHALE 2 PUFFS BY MOUTH INTO THE LUNGS EVERY 6 HOURS AS NEEDED 02/11/21   Coral Spikes, DO  ?ALPRAZolam (XANAX) 0.25 MG tablet Take 1 tablet (0.25 mg total) by mouth daily as needed for anxiety. 04/02/21   Burnard Hawthorne, FNP  ?amLODipine (NORVASC) 5 MG tablet TAKE 1 TABLET(5 MG) BY MOUTH DAILY 02/22/22   Kennyth Arnold, FNP  ?aspirin-acetaminophen-caffeine [facility] MIGRAINE)  250-250-65 MG tablet Take by mouth every 6 (six) hours as needed for headache.    [provider]  ?fexofenadine (ALLEGRA) 180 MG tablet Take 180 mg by mouth as needed for allergies or rhinitis.    [provider]  ?fluticasone-salmeterol (ADVAIR HFA) 115-21 MCG/ACT inhaler INHALE 2 PUFFS INTO THE LUNGS TWICE DAILY AS NEEDED 02/11/21   Coral Spikes, DO  ?traZODone (DESYREL) 50 MG tablet TAKE 1 TABLET(50 MG) BY MOUTH AT BEDTIME AS NEEDED FOR SLEEP 02/18/22   Burnard Hawthorne, FNP  ? ? ?Family History  ?Problem  Relation Age of Onset  ? Hypertension Mother   ? Heart disease Father   ? Diabetes Father   ? Stroke Father   ? Heart disease Paternal Grandfather   ? Breast cancer Maternal Aunt   ? Colon cancer Maternal Aunt 80  ? Breast cancer Paternal Grandmother   ? Diabetes Other   ? Melanoma Neg Hx   ?  ? ?Social History  ? ?Tobacco Use  ? Smoking status: Never  ? Smokeless tobacco: Never  ?Vaping Use  ? Vaping Use: Never used  ?Substance Use Topics  ? Alcohol use: Yes  ?  Comment: social  ? Drug use: No  ? ? ?Allergies as of 02/26/2022 - Review Complete 02/18/2022  ?Allergen Reaction Noted  ? Amoxicillin-pot clavulanate  10/23/2011  ? ? ?Review of Systems:    ?All systems reviewed and negative except where noted in HPI. ? ? Physical Exam:  ?LMP 02/05/2022 (Exact Date)  ?Patient's last menstrual period was 02/05/2022 (exact date). ?General:   Alert,  Well-developed, well-nourished, pleasant and cooperative in NAD ?Head:  Normocephalic and atraumatic. ?Eyes:  Sclera clear, no icterus.   Conjunctiva pink. ?Ears:  Normal auditory acuity. ?Neck:  Supple; no masses or thyromegaly. ?Lungs:  Respirations even and unlabored.  Clear throughout to auscultation.   No wheezes, crackles, or rhonchi. No acute distress. ?Heart:  Regular rate and rhythm; no murmurs, clicks, rubs, or gallops. ?Abdomen:  Normal bowel sounds.  No bruits.  Soft, non-tender and non-distended without masses, hepatosplenomegaly or hernias noted.  No guarding or rebound tenderness.  Negative Carnett sign.   ?Rectal:  Deferred.  ?Pulses:  Normal pulses noted. ?Extremities:  No clubbing or edema.  No cyanosis. ?Neurologic:  Alert and oriented x3;  grossly normal neurologically. ?Skin:  Intact without significant lesions or rashes.  No jaundice. ?Lymph Nodes:  No significant cervical adenopathy. ?Psych:  Alert and cooperative. Normal mood and affect. ? ?Imaging Studies: ?US Abdomen Limited RUQ (LIVER/GB) ? ?Result Date: 02/23/2022 ?CLINICAL DATA:  51 year old female  with abdominal pain. "Liver lesion seen on CT Abdomen and Pelvis 02/12/2022". EXAM: ULTRASOUND ABDOMEN LIMITED RIGHT UPPER QUADRANT COMPARISON:  None, no prior abdominal CT available. FINDINGS: Gallbladder: No gallstones or wall thickening visualized. No sonographic Murphy sign noted by sonographer. Common bile duct: Diameter: 4-5 mm, normal. Liver: Echogenic liver (images 48, 63). Solitary small oval 7 x 8 mm lesion in the left hepatic lobe (image 37) with increased through transmission and no vascular elements on brief Doppler interrogation (image 33) is compatible with a simple cyst. No other discrete liver lesion. No intrahepatic biliary ductal dilatation. Portal vein is patent on color Doppler imaging with normal direction of blood flow towards the liver. Other: Negative visible right kidney.  No free fluid. IMPRESSION: 1. Evidence of hepatic steatosis. 2. Small benign 8 mm cyst in the left hepatic lobe. No other liver lesion by ultrasound. 3. Normal gallbladder and bile  ducts. Electronically Signed   By: Genevie Ann M.D.   On: 02/23/2022 14:55   ? ?Assessment and Plan:  ? ?Velta D Rodger is a 51 y.o. y/o female Who comes in today without any signs of rebound and her symptoms are improving although she has some loose stools.  The patient did have a questionable appendicitis versus a mass in the appendix and signs of diverticulitis.  There is insufficient evidence to date this patient has any gastroenteritis and it most likely represents diverticulitis and appendicitis.  The patient will be sent to surgery for evaluation but in the meantime she will have a repeat CT scan to see if the diverticulitis has resolved.  I do think that the patient will eventually need her appendix out due to the concern of a possible mass.  The patient has been explained the plan and agrees with it. ? ? ? ?Lucilla Lame, MD. Marval Regal ? ? ? Note: This dictation was prepared with Dragon dictation along with smaller phrase technology. Any  transcriptional errors that result from this process are unintentional.   ?

## 2022-03-17 ENCOUNTER — Ambulatory Visit
Admission: RE | Admit: 2022-03-17 | Discharge: 2022-03-17 | Disposition: A | Discharge status: Home / Self Care | Payer: Managed Care, Other (non HMO) | Source: Ambulatory Visit | Attending: Gastroenterology | Admitting: Gastroenterology

## 2022-03-17 DIAGNOSIS — K388 Other specified diseases of appendix: Secondary | ICD-10-CM | POA: Insufficient documentation

## 2022-03-17 MED ORDER — IOHEXOL 300 MG/ML  SOLN
100.0000 mL | Freq: Once | INTRAMUSCULAR | Status: AC | PRN
  Administered 2022-03-17: 100 mL via INTRAVENOUS

## 2022-03-25 ENCOUNTER — Ambulatory Visit (INDEPENDENT_AMBULATORY_CARE_PROVIDER_SITE_OTHER): Payer: Managed Care, Other (non HMO) | Admitting: Surgery

## 2022-03-25 ENCOUNTER — Encounter: Payer: Self-pay | Admitting: Surgery

## 2022-03-25 ENCOUNTER — Telehealth: Payer: Self-pay

## 2022-03-25 VITALS — BP 144/85 | HR 59 | Temp 97.9°F | Ht 66.5 in | Wt 163.2 lb

## 2022-03-25 DIAGNOSIS — K388 Other specified diseases of appendix: Secondary | ICD-10-CM | POA: Diagnosis not present

## 2022-03-25 NOTE — Progress Notes (Signed)
Patient ID: Briana Klein, female   DOB: 1971/06/08, 51 y.o.   MRN: [id number] ? ?HPI ?Briana Klein is a 51 y.o. female seen in consultation at the request of Dr Allen Norris for an appendiceal lesion found on incidental CT.  The patient has been having upper abdominal pain for several months.  Pain is intermittent sharp and moderate intensity. she does have associated diarrhea.  Pain seems to migrate to the back and also sometimes to the left lower quadrant.  She went to [facility] ER about a month ago for lower abd pain, ? Diverticulitis  and a CT scan was performed.  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  ?Was treated with antibiotic and her pain resolved.Marland Kitchen ?SHe did see GI and they were holding on any endoscopic intervention at this time given questionable diverticulitis ? ? SHe also had a repeat CT scan last week that I have personally reviewed showing findings with thick appendix but no evidence of any acute intra-abdominal abnormalities.  Please note that at the time of this documentation there was still pending a final radiology read. ?CBC and CMP was completely normal.  She also had an ultrasound that I personally reviewed showing no acute gallbladder or biliary issues. ?She denies any family history of ulcerative colitis or Crohn's disease.  She endorses that the causing the at had colon cancer. ?SHe works for [employer] as a Scientist, physiological, she does have a bachelor's in biology. ?She does have general anxiety disorder and has attributed most of her GI symptoms to her anxiety. ?Some point in time she did have a history of dysrhythmia and so cardiologist and was placed on a short course of anticoagulation ? ?HPI ? ?Past Medical History:  ?Diagnosis Date  ? Abnormal stress test   ? Dr. Mancel Parsons  ? Anxiety   ? Asthma   ? inhaler  ? Chronic diarrhea   ? history  ? Hyperlipidemia   ? Hypertension   ? Migraines   ? otc med prn  ? Seasonal allergies   ? SVD (spontaneous vaginal delivery)   ? x 3  ?  Vitamin D deficiency   ? ? ?Past Surgical History:  ?Procedure Laterality Date  ? HYSTEROSCOPY WITH NOVASURE N/A 12/12/2013  ? Procedure: HYSTEROSCOPY WITH NOVASURE;  Surgeon: Logan Bores, MD;  Location: [facility];  Service: Gynecology;  Laterality: N/A;  ? ? ?Family History  ?Problem Relation Age of Onset  ? Hypertension Mother   ? Heart disease Father   ? Diabetes Father   ? Stroke Father   ? Heart disease Paternal Grandfather   ? Breast cancer Maternal Aunt   ? Colon cancer Maternal Aunt 80  ? Breast cancer Paternal Grandmother   ? Diabetes Other   ? Melanoma Neg Hx   ? ? ?Social History ?Social History  ? ?Tobacco Use  ? Smoking status: Never  ? Smokeless tobacco: Never  ?Vaping Use  ? Vaping Use: Never used  ?Substance Use Topics  ? Alcohol use: Yes  ?  Comment: social  ? Drug use: No  ? ? ?Allergies  ?Allergen Reactions  ? Amoxicillin-Pot Clavulanate   ?  Passed out, has taken penicillin and amoxicillin w/no problems  ? ? ?Current Outpatient Medications  ?Medication Sig Dispense Refill  ? acetaminophen (TYLENOL) 325 MG tablet Take 650 mg by mouth every 6 (six) hours as needed.    ? albuterol (VENTOLIN HFA) 108 (90 Base) MCG/ACT inhaler INHALE 2 PUFFS BY MOUTH INTO THE LUNGS  EVERY 6 HOURS AS NEEDED 18 g 3  ? ALPRAZolam (XANAX) 0.25 MG tablet Take 1 tablet (0.25 mg total) by mouth daily as needed for anxiety. 15 tablet 0  ? amLODipine (NORVASC) 5 MG tablet TAKE 1 TABLET(5 MG) BY MOUTH DAILY 90 tablet 1  ? aspirin-acetaminophen-caffeine (EXCEDRIN MIGRAINE) 250-250-65 MG tablet Take by mouth every 6 (six) hours as needed for headache.    ? fexofenadine (ALLEGRA) 180 MG tablet Take 180 mg by mouth as needed for allergies or rhinitis.    ? fluticasone-salmeterol (ADVAIR HFA) 115-21 MCG/ACT inhaler INHALE 2 PUFFS INTO THE LUNGS TWICE DAILY AS NEEDED 12 g 0  ? traZODone (DESYREL) 50 MG tablet TAKE 1 TABLET(50 MG) BY MOUTH AT BEDTIME AS NEEDED FOR SLEEP 90 tablet 1  ? ?No current facility-administered medications  for this visit.  ? ? ? ?Review of Systems ?Full ROS  was asked and was negative except for the information on the HPI ? ?Physical Exam ?Blood pressure (!) 144/85, pulse (!) 59, temperature 97.9 ?F (36.6 ?C), temperature source Oral, height 5' 6.5" (1.689 m), weight 163 lb 3.2 oz (74 kg), SpO2 98 %. ?CONSTITUTIONAL: NAD. ?EYES: Pupils are equal, round,  Sclera are non-icteric. ?EARS, NOSE, MOUTH AND THROAT: The oropharynx is clear. The oral mucosa is pink and moist. Hearing is intact to voice. ?LYMPH NODES:  Lymph nodes in the neck are normal. ?RESPIRATORY:  Lungs are clear. There is normal respiratory effort, with equal breath sounds bilaterally, and without pathologic use of accessory muscles. ?CARDIOVASCULAR: Heart is regular without murmurs, gallops, or rubs. ?GI: The abdomen is  soft, nontender, and nondistended. There are no palpable masses. There is no hepatosplenomegaly. There are normal bowel sounds in all quadrants. ?GU: Rectal deferred.   ?MUSCULOSKELETAL: Normal muscle strength and tone. No cyanosis or edema.   ?SKIN: Turgor is good and there are no pathologic skin lesions or ulcers. ?NEUROLOGIC: Motor and sensation is grossly normal. Cranial nerves are grossly intact. ?PSYCH:  Oriented to person, place and time. Affect is normal. ? ?Data Reviewed ? ?I have personally reviewed the patient's imaging, laboratory findings and medical records.   ? 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  I had an extensive discussion with her regarding her disease process.  Typically appendiceal mucoceles are a bit tricky.  Only 1 good way to diagnosis is to perform appendectomy and histology confirmation.  Also discussed with her that depending on the final pathology she may not require any further intervention versus potential right colectomy.  We will first have to establish a good diagnosis before tailoring her therapy. ?He does have a planned trip coming up to [place] next  month and wishes not to have any immediate surgeries if at all possible.  I will touch base with Dr.Wohl about hopefully performing colonoscopy before surgical intervention is done.  She is in agreement with the plan. ?I spent  60 minutes in this encounter including personally reviewing medical records, coordination of care, counseling the patient, placing orders and performing appropriate documentation. ?I have also called [facility] radiology to have an updated report on the latest CT scan ?A copy of this report was sent to the referring provider ? ? ? ?Caroleen Hamman, MD FACS ?General Surgeon ?03/25/2022, 11:36 AM ? ?  ?

## 2022-03-25 NOTE — Patient Instructions (Addendum)
If you have any concerns or questions, please feel free to call our office. See follow up appointment below.  ? ? ?Colonoscopy, Adult ? ?A colonoscopy is a procedure to look at the entire large intestine. This procedure is done using a long, thin, flexible tube that has a camera on the end. ?You may have a colonoscopy: ?As a part of normal colorectal screening. ?If you have certain symptoms, such as: ?A low number of red blood cells in your blood (anemia). ?Diarrhea that does not go away. ?Pain in your abdomen. ?Blood in your stool. ?A colonoscopy can help screen for and diagnose medical problems, including: ?Tumors. ?Extra tissue that grows where mucus forms (polyps). ?Inflammation. ?Areas of bleeding. ?Tell your health care provider about: ?Any allergies you have. ?All medicines you are taking, including vitamins, herbs, eye drops, creams, and over-the-counter medicines. ?Any problems you or family members have had with anesthetic medicines. ?Any blood disorders you have. ?Any surgeries you have had. ?Any medical conditions you have. ?Any problems you have had with having bowel movements. ?Whether you are pregnant or may be pregnant. ?What are the risks? ?Generally, this is a safe procedure. However, problems may occur, including: ?Bleeding. ?Damage to your intestine. ?Allergic reactions to medicines given during the procedure. ?Infection. This is rare. ?What happens before the procedure? ?Eating and drinking restrictions ?Follow instructions from your health care provider about eating or drinking restrictions, which may include: ?A few days before the procedure: ?Follow a low-fiber diet. ?Avoid nuts, seeds, dried fruit, raw fruits, and vegetables. ?1-3 days before the procedure: ?Eat only gelatin dessert or ice pops. ?Drink only clear liquids, such as water, clear juice, clear broth or bouillon, black coffee or tea, or clear soft drinks or sports drinks. ?Avoid liquids that contain red or purple dye. ?The day of  the procedure: ?Do not eat solid foods. You may continue to drink clear liquids until up to 2 hours before the procedure. ?Do not eat or drink anything starting 2 hours before the procedure, or within the time period that your health care provider recommends. ?Bowel prep ?If you were prescribed a bowel prep to take by mouth (orally) to clean out your colon: ?Take it as told by your health care provider. Starting the day before your procedure, you will need to drink a large amount of liquid medicine. The liquid will cause you to have many bowel movements of loose stool until your stool becomes almost clear or light green. ?If your skin or the opening between the buttocks (anus) gets irritated from diarrhea, you may relieve the irritation using: ?Wipes with medicine in them, such as adult wet wipes with aloe and vitamin E. ?A product to soothe skin, such as petroleum jelly. ?If you vomit while drinking the bowel prep: ?Take a break for up to 60 minutes. ?Begin the bowel prep again. ?Call your health care provider if you keep vomiting or you cannot take the bowel prep without vomiting. ?To clean out your colon, you may also be given: ?Laxative medicines. These help you have a bowel movement. ?Instructions for enema use. An enema is liquid medicine injected into your rectum. ?Medicines ?Ask your health care provider about: ?Changing or stopping your regular medicines or supplements. This is especially important if you are taking iron supplements, diabetes medicines, or blood thinners. ?Taking medicines such as aspirin and ibuprofen. These medicines can thin your blood. Do not take these medicines unless your health care provider tells you to take them. ?Taking over-the-counter medicines,  vitamins, herbs, and supplements. ?General instructions ?Ask your health care provider what steps will be taken to help prevent infection. These may include washing skin with a germ-killing soap. ?Plan to have someone take you home from  the hospital or clinic. ?What happens during the procedure? ? ?An IV will be inserted into one of your veins. ?You may be given one or more of the following: ?A medicine to help you relax (sedative). ?A medicine to numb the area (local anesthetic). ?A medicine to make you fall asleep (general anesthetic). This is rarely needed. ?You will lie on your side with your knees bent. ?The tube will: ?Have oil or gel put on it (be lubricated). ?Be inserted into your anus. ?Be gently eased through all parts of your large intestine. ?Air will be sent into your colon to keep it open. This may cause some pressure or cramping. ?Images will be taken with the camera and will appear on a screen. ?A small tissue sample may be removed to be looked at under a microscope (biopsy). The tissue may be sent to a lab for testing if any signs of problems are found. ?If small polyps are found, they may be removed and checked for cancer cells. ?When the procedure is finished, the tube will be removed. ?The procedure may vary among health care providers and hospitals. ?What happens after the procedure? ?Your blood pressure, heart rate, breathing rate, and blood oxygen level will be monitored until you leave the hospital or clinic. ?You may have a small amount of blood in your stool. ?You may pass gas and have mild cramping or bloating in your abdomen. This is caused by the air that was used to open your colon during the exam. ?Do not drive for 24 hours after the procedure. ?It is up to you to get the results of your procedure. Ask your health care provider, or the department that is doing the procedure, when your results will be ready. ?Summary ?A colonoscopy is a procedure to look at the entire large intestine. ?Follow instructions from your health care provider about eating and drinking before the procedure. ?If you were prescribed an oral bowel prep to clean out your colon, take it as told by your health care provider. ?During the  colonoscopy, a flexible tube with a camera on its end is inserted into the anus and then passed into the other parts of the large intestine. ?This information is not intended to replace advice given to you by your health care provider. Make sure you discuss any questions you have with your health care provider. ?Document Revised: 06/23/2019 Document Reviewed: 06/23/2019 ?Elsevier Patient Education ? 2022 [place]. ? ? ?Laparoscopic Appendectomy, Adult ?A laparoscopic appendectomy is a surgery to take out the appendix. The appendix is a finger-like structure that is attached to the large intestine. In this surgery, the appendix is removed through three small incisions with the help of a thin, lighted tube that has a camera (laparoscope). ?This procedure may be done to prevent an inflamed appendix from bursting (rupturing). It may also be done to treat the infection from an appendix that has already ruptured. It is usually done right after inflammation of the appendix (appendicitis) is diagnosed. This is a minimally invasive surgery. It usually results in less pain, fewer problems, and a quicker recovery than surgery done through a large incision. ?Tell a health care provider about: ?Any allergies you have. ?All medicines you are taking, including vitamins, herbs, eye drops, creams, and over-the-counter medicines. ?  Use of steroids (by mouth or creams). ?Any problems you or family members have had with anesthetic medicines. ?Any blood disorders you have. ?Any surgeries you have had. ?Any medical conditions you have. ?Whether you are pregnant or may be pregnant. ?What are the risks? ?Generally, this is a safe procedure. However, problems may occur, including: ?Infection. ?Bleeding. ?Allergic reactions to medicines. ?Damage to other structures or organs. ?The formation of pus (abscesses). ?Long-lasting pain or scarring at the incision sites or inside the abdomen. ?Blood clots in the legs. ?What happens before the  procedure? ?Eating and drinking restrictions ?Follow instructions from your health care provider about eating and drinking restrictions. You may be asked not to eat or drink as soon as the diagnosis of appendiciti

## 2022-03-25 NOTE — Telephone Encounter (Signed)
Briana Sarita Haver, MD ? 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 There was a questionable episode of diverticulitis. Repeat CT scan last week looks ok. Would you please do colonoscopy before appendectomy. Hate to miss something before surgery. She will go on a trip to [place] but is availale for colonoscopy before 1st week of May. ? ?No availability in [facility] for Dr Allen Norris, Dr Vicente Males approved to have pt scheduled with him [facility] ?

## 2022-03-26 ENCOUNTER — Encounter: Payer: Self-pay | Admitting: Gastroenterology

## 2022-03-26 ENCOUNTER — Other Ambulatory Visit: Payer: Self-pay

## 2022-03-26 DIAGNOSIS — R933 Abnormal findings on diagnostic imaging of other parts of digestive tract: Secondary | ICD-10-CM

## 2022-03-26 MED ORDER — NA SULFATE-K SULFATE-MG SULF 17.5-3.13-1.6 GM/177ML PO SOLN: 1.0000 | Freq: Once | ORAL | 0 refills | Status: AC

## 2022-03-26 NOTE — Telephone Encounter (Signed)
Pt scheduled for 4/17 in [place]... I went over instructions with pt and she expressed understanding... ? Instructions released to mychart and also pasted into a mychart msg to pt... ?Prep sent to [facility]  ?

## 2022-03-26 NOTE — Addendum Note (Signed)
Addended by: Lurlean Nanny on: 03/26/2022 03:39 PM ? ? Modules accepted: Orders ? ?

## 2022-03-27 ENCOUNTER — Encounter: Payer: Self-pay | Admitting: Gastroenterology

## 2022-03-30 ENCOUNTER — Encounter
Admission: RE | Disposition: A | Discharge status: Home / Self Care | Payer: Self-pay | Source: Home / Self Care | Attending: Gastroenterology

## 2022-03-30 ENCOUNTER — Ambulatory Visit: Payer: Managed Care, Other (non HMO) | Admitting: Anesthesiology

## 2022-03-30 ENCOUNTER — Ambulatory Visit
Admission: RE | Admit: 2022-03-30 | Discharge: 2022-03-30 | Disposition: A | Discharge status: Home / Self Care | Payer: Managed Care, Other (non HMO) | Attending: Gastroenterology | Admitting: Gastroenterology

## 2022-03-30 ENCOUNTER — Other Ambulatory Visit: Payer: Self-pay

## 2022-03-30 ENCOUNTER — Encounter: Payer: Self-pay | Admitting: Gastroenterology

## 2022-03-30 DIAGNOSIS — R933 Abnormal findings on diagnostic imaging of other parts of digestive tract: Secondary | ICD-10-CM

## 2022-03-30 DIAGNOSIS — Z1211 Encounter for screening for malignant neoplasm of colon: Secondary | ICD-10-CM

## 2022-03-30 DIAGNOSIS — I480 Paroxysmal atrial fibrillation: Secondary | ICD-10-CM | POA: Insufficient documentation

## 2022-03-30 DIAGNOSIS — J45909 Unspecified asthma, uncomplicated: Secondary | ICD-10-CM | POA: Diagnosis not present

## 2022-03-30 DIAGNOSIS — D490 Neoplasm of unspecified behavior of digestive system: Secondary | ICD-10-CM | POA: Diagnosis not present

## 2022-03-30 DIAGNOSIS — I10 Essential (primary) hypertension: Secondary | ICD-10-CM | POA: Diagnosis not present

## 2022-03-30 HISTORY — PX: COLONOSCOPY: SHX5424

## 2022-03-30 SURGERY — COLONOSCOPY
Anesthesia: General | Site: Rectum

## 2022-03-30 MED ORDER — PROPOFOL 10 MG/ML IV BOLUS
INTRAVENOUS | Status: DC | PRN
  Administered 2022-03-30: 30 mg via INTRAVENOUS
  Administered 2022-03-30: 70 mg via INTRAVENOUS
  Administered 2022-03-30: 50 mg via INTRAVENOUS

## 2022-03-30 MED ORDER — STERILE WATER FOR IRRIGATION IR SOLN
Status: DC | PRN
  Administered 2022-03-30: .05 mL

## 2022-03-30 MED ORDER — SODIUM CHLORIDE 0.9 % IV SOLN: INTRAVENOUS | Status: DC

## 2022-03-30 MED ORDER — LACTATED RINGERS IV SOLN: INTRAVENOUS | Status: DC

## 2022-03-30 MED ORDER — STERILE WATER FOR IRRIGATION IR SOLN
Status: DC | PRN
  Administered 2022-03-30: 250 mL

## 2022-03-30 MED ORDER — OXYCODONE HCL 5 MG/5ML PO SOLN: 5.0000 mg | Freq: Once | ORAL | Status: DC | PRN

## 2022-03-30 MED ORDER — LIDOCAINE HCL (CARDIAC) PF 100 MG/5ML IV SOSY
PREFILLED_SYRINGE | INTRAVENOUS | Status: DC | PRN
  Administered 2022-03-30: 30 mg via INTRAVENOUS

## 2022-03-30 MED ORDER — OXYCODONE HCL 5 MG PO TABS: 5.0000 mg | ORAL_TABLET | Freq: Once | ORAL | Status: DC | PRN

## 2022-03-30 SURGICAL SUPPLY — 7 items
FORCEPS BIOP RAD 4 LRG CAP 4 (CUTTING FORCEPS) ×1 IMPLANT
GOWN CVR UNV OPN BCK APRN NK (MISCELLANEOUS) ×2 IMPLANT
GOWN ISOL THUMB LOOP REG UNIV (MISCELLANEOUS) ×4
KIT PRC NS LF DISP ENDO (KITS) ×1 IMPLANT
KIT PROCEDURE OLYMPUS (KITS) ×2
MANIFOLD NEPTUNE II (INSTRUMENTS) ×2 IMPLANT
WATER STERILE IRR 250ML POUR (IV SOLUTION) ×2 IMPLANT

## 2022-03-30 NOTE — Transfer of Care (Signed)
Immediate Anesthesia Transfer of Care Note ? ?Patient: Briana Klein ? ?Procedure(s) Performed: COLONOSCOPY (Rectum) ? ?Patient Location: PACU ? ?Anesthesia Type: General ? ?Level of Consciousness: awake, alert  and patient cooperative ? ?Airway and Oxygen Therapy: Patient Spontanous Breathing and Patient connected to supplemental oxygen ? ?Post-op Assessment: Post-op Vital signs reviewed, Patient's Cardiovascular Status Stable, Respiratory Function Stable, Patent Airway and No signs of Nausea or vomiting ? ?Post-op Vital Signs: Reviewed and stable ? ?Complications: No notable events documented. ? ?

## 2022-03-30 NOTE — Anesthesia Preprocedure Evaluation (Signed)
Anesthesia Evaluation  ?Patient identified by MRN, date of birth, ID band ?Patient awake ? ? ? ?Reviewed: ?NPO status  ? ?History of Anesthesia Complications ?Negative for: history of anesthetic complications ? ?Airway ?Mallampati: II ? ?TM Distance: >3 FB ?Neck ROM: full ? ? ? Dental ?no notable dental hx. ? ?  ?Pulmonary ?asthma (mild) ,  ?  ?Pulmonary exam normal ? ? ? ? ? ? ? Cardiovascular ?Exercise Tolerance: Good ?hypertension, + dysrhythmias (p. afib) Atrial Fibrillation  ? ? ?  ?Neuro/Psych ?Anxiety negative neurological ROS ?   ? GI/Hepatic ?Neg liver ROS, IBS ?  ?Endo/Other  ? ? Renal/GU ?negative Renal ROS  ?negative genitourinary ?  ?Musculoskeletal ? ? Abdominal ?  ?Peds ? Hematology ?negative hematology ROS ?(+)   ?Anesthesia Other Findings ? ? Reproductive/Obstetrics ?LMP : years ago ? ?  ? ? ? ? ? ? ? ? ? ? ? ? ? ?  ?  ? ? ? ? ? ? ? ? ?Anesthesia Physical ?Anesthesia Plan ? ?ASA: 2 ? ?Anesthesia Plan: General  ? ?Post-op Pain Management:   ? ?Induction:  ? ?PONV Risk Score and Plan: 2 and TIVA and Propofol infusion ? ?Airway Management Planned:  ? ?Additional Equipment:  ? ?Intra-op Plan:  ? ?Post-operative Plan:  ? ?Informed Consent: I have reviewed the patients History and Physical, chart, labs and discussed the procedure including the risks, benefits and alternatives for the proposed anesthesia with the patient or authorized representative who has indicated his/her understanding and acceptance.  ? ? ? ? ? ?Plan Discussed with: CRNA ? ?Anesthesia Plan Comments:   ? ? ? ? ? ? ?Anesthesia Quick Evaluation ? ?

## 2022-03-30 NOTE — Anesthesia Postprocedure Evaluation (Signed)
Anesthesia Post Note ? ?Patient: Briana Klein ? ?Procedure(s) Performed: COLONOSCOPY (Rectum) ? ? ?  ?Patient location during evaluation: PACU ?Anesthesia Type: General ?Level of consciousness: awake and alert ?Pain management: pain level controlled ?Vital Signs Assessment: post-procedure vital signs reviewed and stable ?Respiratory status: spontaneous breathing, nonlabored ventilation, respiratory function stable and patient connected to nasal cannula oxygen ?Cardiovascular status: blood pressure returned to baseline and stable ?Postop Assessment: no apparent nausea or vomiting ?Anesthetic complications: no ? ? ?No notable events documented. ? ?Fidel Levy ? ? ? ? ? ?

## 2022-03-30 NOTE — Op Note (Signed)
[facility] ?Gastroenterology ?Patient Name: Briana Klein ?Procedure Date: 03/30/2022 10:47 AM ?MRN: [id number] ?Account #: [id number] ?Date of Birth: 02/11/1971 ?Admit Type: Outpatient ?Age: 51 ?Room: [facility] OR ROOM 01 ?Gender: Female ?Note Status: Finalized ?Instrument Name: [id number] ?Procedure:             Colonoscopy ?Indications:           Screening for colorectal malignant neoplasm ?Providers:             Lucilla Lame MD, MD ?Referring MD:          Yvetta Coder. Arnett (Referring MD) ?Medicines:             Propofol per Anesthesia ?Complications:         No immediate complications. ?Procedure:             Pre-Anesthesia Assessment: ?                       - Prior to the procedure, a History and Physical was  ?                       performed, and patient medications and allergies were  ?                       reviewed. The patient's tolerance of previous  ?                       anesthesia was also reviewed. The risks and benefits  ?                       of the procedure and the sedation options and risks  ?                       were discussed with the patient. All questions were  ?                       answered, and informed consent was obtained. Prior  ?                       Anticoagulants: The patient has taken no previous  ?                       anticoagulant or antiplatelet agents. ASA Grade  ?                       Assessment: II - A patient with mild systemic disease.  ?                       After reviewing the risks and benefits, the patient  ?                       was deemed in satisfactory condition to undergo the  ?                       procedure. ?                       After obtaining informed consent, the colonoscope was  ?  passed under direct vision. Throughout the procedure,  ?                       the patient's blood pressure, pulse, and oxygen  ?                       saturations were monitored continuously. The  ?                       Colonoscope  was introduced through the anus and  ?                       advanced to the the cecum, identified by appendiceal  ?                       orifice and ileocecal valve. The colonoscopy was  ?                       performed without difficulty. The patient tolerated  ?                       the procedure well. The quality of the bowel  ?                       preparation was excellent. ?Findings: ?     The perianal and digital rectal examinations were normal. ?     A polypoid lesion was found at the appendiceal orifice. The lesion was  ?     umbilicated. No bleeding was present. This was biopsied with a cold  ?     forceps for histology. ?Impression:            - Polypoid lesion at the appendiceal orifice. Biopsied. ?Recommendation:        - Discharge patient to home. ?                       - Resume previous diet. ?                       - Continue present medications. ?                       - Await pathology results. ?Procedure Code(s):     --- Professional --- ?                       [id number], Colonoscopy, flexible; with biopsy, single or  ?                       multiple ?Diagnosis Code(s):     --- Professional --- ?                       Z12.11, Encounter for screening for malignant neoplasm  ?                       of colon ?                       D49.0, Neoplasm of unspecified behavior of digestive  ?  system ?CPT copyright 2019 American Medical Association. All rights reserved. ?The codes documented in this report are preliminary and upon coder review may  ?be revised to meet current compliance requirements. ?Lucilla Lame MD, MD ?03/30/2022 11:08:03 AM ?This report has been signed electronically. ?Number of Addenda: 0 ?Note Initiated On: 03/30/2022 10:47 AM ?Scope Withdrawal Time: 0 hours 7 minutes 48 seconds  ?Total Procedure Duration: 0 hours 13 minutes 29 seconds  ?Estimated Blood Loss:  Estimated blood loss: none. ?     [facility] ?

## 2022-03-30 NOTE — H&P (Signed)
? ?Briana Lame, MD [facility] ?[address] ?[place], [place] [address] ?Phone: [phone] ?Fax : [phone] ? ?Primary Care Physician:  Burnard Hawthorne, FNP ?Primary Gastroenterologist:  Dr. Allen Norris ? ?Pre-Procedure History & Physical: ?HPI:  Briana Klein is a 51 y.o. female is here for a screening colonoscopy.  ? ?Past Medical History:  ?Diagnosis Date  ? Abnormal stress test   ? Dr. Mancel Parsons  ? Anxiety   ? Asthma   ? inhaler  ? Chronic diarrhea   ? history  ? Hyperlipidemia   ? Hypertension   ? Migraines   ? otc med prn  ? Seasonal allergies   ? SVD (spontaneous vaginal delivery)   ? x 3  ? Vitamin D deficiency   ? ? ?Past Surgical History:  ?Procedure Laterality Date  ? HYSTEROSCOPY WITH NOVASURE N/A 12/12/2013  ? Procedure: HYSTEROSCOPY WITH NOVASURE;  Surgeon: Logan Bores, MD;  Location: [facility];  Service: Gynecology;  Laterality: N/A;  ? ? ?Prior to Admission medications   ?Medication Sig Start Date End Date Taking? Authorizing Provider  ?acetaminophen (TYLENOL) 325 MG tablet Take 650 mg by mouth every 6 (six) hours as needed.   Yes [provider]  ?albuterol (VENTOLIN HFA) 108 (90 Base) MCG/ACT inhaler INHALE 2 PUFFS BY MOUTH INTO THE LUNGS EVERY 6 HOURS AS NEEDED 02/11/21  Yes Coral Spikes, DO  ?ALPRAZolam (XANAX) 0.25 MG tablet Take 1 tablet (0.25 mg total) by mouth daily as needed for anxiety. 04/02/21  Yes Burnard Hawthorne, FNP  ?amLODipine (NORVASC) 5 MG tablet TAKE 1 TABLET(5 MG) BY MOUTH DAILY 02/22/22  Yes Dutch Quint B, FNP  ?aspirin-acetaminophen-caffeine (EXCEDRIN MIGRAINE) 8488688601 MG tablet Take by mouth every 6 (six) hours as needed for headache.   Yes [provider]  ?fexofenadine (ALLEGRA) 180 MG tablet Take 180 mg by mouth as needed for allergies or rhinitis.   Yes [provider]  ?fluticasone-salmeterol (ADVAIR HFA) 115-21 MCG/ACT inhaler INHALE 2 PUFFS INTO THE LUNGS TWICE DAILY AS NEEDED 02/11/21  Yes Thersa Salt G, DO  ?traZODone (DESYREL)  50 MG tablet TAKE 1 TABLET(50 MG) BY MOUTH AT BEDTIME AS NEEDED FOR SLEEP 02/18/22  Yes Burnard Hawthorne, FNP  ? ? ?Allergies as of 03/26/2022 - Review Complete 03/25/2022  ?Allergen Reaction Noted  ? Amoxicillin-pot clavulanate  10/23/2011  ? ? ?Family History  ?Problem Relation Age of Onset  ? Hypertension Mother   ? Heart disease Father   ? Diabetes Father   ? Stroke Father   ? Heart disease Paternal Grandfather   ? Breast cancer Maternal Aunt   ? Colon cancer Maternal Aunt 80  ? Breast cancer Paternal Grandmother   ? Diabetes Other   ? Melanoma Neg Hx   ? ? ?Social History  ? ?Socioeconomic History  ? Marital status: Married  ?  Spouse name: Not on file  ? Number of children: 3  ? Years of education: Not on file  ? Highest education level: Not on file  ?Occupational History  ? Occupation: [employer]  ?  Employer: [employer]  ?Tobacco Use  ? Smoking status: Never  ? Smokeless tobacco: Never  ?Vaping Use  ? Vaping Use: Never used  ?Substance and Sexual Activity  ? Alcohol use: Yes  ?  Comment: social  ? Drug use: No  ? Sexual activity: Yes  ?  Birth control/protection: None  ?  Comment: husband vasectomy  ?Other Topics Concern  ? Not on file  ?  Social History Narrative  ? Lives in [place]. Works in [employer] at [employer].   ?   ? 3 children. 2 cats and dog.   ?   ? Diet: regular.   ? Daily Caffeine Use:  1 sweet tea and 1 soda  ? Regular Exercise -  None right now; had a personal trainer in 2016. ; plans to start biking in Fall    ?   ?   ? ?Social Determinants of Health  ? ?Financial Resource Strain: Not on file  ?Food Insecurity: Not on file  ?Transportation Needs: Not on file  ?Physical Activity: Not on file  ?Stress: Not on file  ?Social Connections: Not on file  ?Intimate Partner Violence: Not on file  ? ? ?Review of Systems: ?See HPI, otherwise negative ROS ? ?Physical Exam: ? ?General:   Alert,  pleasant and cooperative in NAD ?Head:  Normocephalic and atraumatic. ?Neck:  Supple; no masses or  thyromegaly. ?Lungs:  Clear throughout to auscultation.    ?Heart:  Regular rate and rhythm. ?Abdomen:  Soft, nontender and nondistended. Normal bowel sounds, without guarding, and without rebound.   ?Neurologic:  Alert and  oriented x4;  grossly normal neurologically. ? ?Impression/Plan: ?Briana Klein is now here to undergo a screening colonoscopy. ? ?Risks, benefits, and alternatives regarding colonoscopy have been reviewed with the patient.  Questions have been answered.  All parties agreeable. ?

## 2022-03-31 ENCOUNTER — Encounter: Payer: Self-pay | Admitting: Gastroenterology

## 2022-04-01 LAB — SURGICAL PATHOLOGY

## 2022-04-03 ENCOUNTER — Encounter: Payer: Self-pay | Admitting: Gastroenterology

## 2022-04-03 ENCOUNTER — Encounter: Payer: Managed Care, Other (non HMO) | Admitting: Family

## 2022-04-06 ENCOUNTER — Ambulatory Visit (INDEPENDENT_AMBULATORY_CARE_PROVIDER_SITE_OTHER): Payer: Managed Care, Other (non HMO) | Admitting: Family

## 2022-04-06 ENCOUNTER — Encounter: Payer: Self-pay | Admitting: Family

## 2022-04-06 ENCOUNTER — Other Ambulatory Visit: Payer: Self-pay | Admitting: Family

## 2022-04-06 VITALS — BP 122/80 | HR 60 | Temp 97.7°F | Ht 66.5 in | Wt 160.8 lb

## 2022-04-06 DIAGNOSIS — K529 Noninfective gastroenteritis and colitis, unspecified: Secondary | ICD-10-CM | POA: Diagnosis not present

## 2022-04-06 DIAGNOSIS — Z0000 Encounter for general adult medical examination without abnormal findings: Secondary | ICD-10-CM | POA: Diagnosis not present

## 2022-04-06 DIAGNOSIS — Z1231 Encounter for screening mammogram for malignant neoplasm of breast: Secondary | ICD-10-CM | POA: Diagnosis not present

## 2022-04-06 DIAGNOSIS — R8290 Unspecified abnormal findings in urine: Secondary | ICD-10-CM

## 2022-04-06 NOTE — Assessment & Plan Note (Signed)
Etiology unclear. She is well appearing today. Benign abdominal exam.  Reviewed clonoscopy and she has follow up scheduled with Dr Dahlia Byes. Discussed trial of lactose free diet and FOD Map diet to see if symptoms were to improve. Advised trial of OTC prilosec for 2 weeks; she may consider this. Pending alpha gal test today as well.  ?

## 2022-04-06 NOTE — Progress Notes (Signed)
? ?Subjective:  ? ? Patient ID: Briana Klein, female    DOB: [birth year], 51 y.o.   MRN: [id number] ? ?CC: Briana Klein is a 51 y.o. female who presents today for physical exam.   ? ?HPI: Feels well today  ?No new complaints ? ? ? ?Continues to have non bloody loose brown stools, approx 3 per day. Bilateral abdominal pain comes and goes 'random' at times , other times afte a meal, improved over time. No abdominal pain today ?Abdominal pain sometimes after a meal and other times no trigger.  ?No known tick bite. Abdominal cramping resolved. No fever, epigastric pain, increased belching or gas, dyspepsia. She reports h/o alternating diarrhea and constipation for years.  ? 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 ?Colorectal Cancer Screening: UTD , Dr Allen Norris ?Breast Cancer Screening: Mammogram due, ordered ?Cervical Cancer Screening: UTD, 03/27/20 ? ?Labs: Screening labs today. ?Exercise: Gets regular exercise, walking most days of the week.  ?Alcohol use:  rare ?Smoking/tobacco use: Nonsmoker.   ? ?Annual follow up with dermatology, Dr Nicole Kindred ? ? ? ? ?HISTORY:  ?Past Medical History:  ?Diagnosis Date  ? Abnormal stress test   ? Dr. Mancel Parsons  ? Anxiety   ? Asthma   ? inhaler  ? Chronic diarrhea   ? history  ? Hyperlipidemia   ? Hypertension   ? Migraines   ? otc med prn  ? Seasonal allergies   ? SVD (spontaneous vaginal delivery)   ? x 3  ? Vitamin D deficiency   ?  ?Past Surgical History:  ?Procedure Laterality Date  ? COLONOSCOPY N/A 03/30/2022  ? Procedure: COLONOSCOPY;  Surgeon: Lucilla Lame, MD;  Location: [facility];  Service: Endoscopy;  Laterality: N/A;  ? HYSTEROSCOPY WITH NOVASURE N/A 12/12/2013  ? Procedure: HYSTEROSCOPY WITH NOVASURE;  Surgeon: Logan Bores, MD;  Location: [facility];  Service: Gynecology;  Laterality: N/A;  ? ?Family History  ?Problem Relation Age of Onset  ? Hypertension Mother   ? Heart disease Father   ? Diabetes Father   ? Stroke Father   ? Heart disease  Paternal Grandfather   ? Breast cancer Maternal Aunt   ? Colon cancer Maternal Aunt 80  ? Breast cancer Paternal Grandmother   ? Diabetes Other   ? Melanoma Neg Hx   ? ?  ? ?ALLERGIES: Amoxicillin-pot clavulanate ? ?Current Outpatient Medications on File Prior to Visit  ?Medication Sig Dispense Refill  ? acetaminophen (TYLENOL) 325 MG tablet Take 650 mg by mouth every 6 (six) hours as needed.    ? albuterol (VENTOLIN HFA) 108 (90 Base) MCG/ACT inhaler INHALE 2 PUFFS BY MOUTH INTO THE LUNGS EVERY 6 HOURS AS NEEDED 18 g 3  ? ALPRAZolam (XANAX) 0.25 MG tablet Take 1 tablet (0.25 mg total) by mouth daily as needed for anxiety. 15 tablet 0  ? amLODipine (NORVASC) 5 MG tablet TAKE 1 TABLET(5 MG) BY MOUTH DAILY 90 tablet 1  ? aspirin-acetaminophen-caffeine (EXCEDRIN MIGRAINE) 250-250-65 MG tablet Take by mouth every 6 (six) hours as needed for headache.    ? fexofenadine (ALLEGRA) 180 MG tablet Take 180 mg by mouth as needed for allergies or rhinitis.    ? fluticasone-salmeterol (ADVAIR HFA) 115-21 MCG/ACT inhaler INHALE 2 PUFFS INTO THE LUNGS TWICE DAILY AS NEEDED 12 g 0  ? traZODone (DESYREL) 50 MG tablet TAKE 1 TABLET(50 MG) BY MOUTH AT BEDTIME AS NEEDED FOR SLEEP 90 tablet 1  ? ?No current facility-administered medications on file  prior to visit.  ? ? ?Social History  ? ?Tobacco Use  ? Smoking status: Never  ? Smokeless tobacco: Never  ?Vaping Use  ? Vaping Use: Never used  ?Substance Use Topics  ? Alcohol use: Yes  ?  Comment: social  ? Drug use: No  ? ? ?Review of Systems  ?Constitutional:  Negative for chills, fever and unexpected weight change.  ?HENT:  Negative for congestion.   ?Respiratory:  Negative for cough.   ?Cardiovascular:  Negative for chest pain, palpitations and leg swelling.  ?Gastrointestinal:  Positive for abdominal pain and diarrhea. Negative for blood in stool, nausea and vomiting.  ?Musculoskeletal:  Negative for arthralgias and myalgias.  ?Skin:  Negative for rash.  ?Neurological:  Negative for  headaches.  ?Hematological:  Negative for adenopathy.  ?Psychiatric/Behavioral:  Negative for confusion.   ?   ?Objective:  ?  ?BP 122/80 (BP Location: Left Arm, Patient Position: Sitting, Cuff Size: Normal)   Pulse 60   Temp 97.7 ?F (36.5 ?C) (Oral)   Ht 5' 6.5" (1.689 m)   Wt 160 lb 12.8 oz (72.9 kg)   SpO2 98%   BMI 25.56 kg/m?  ? ?BP Readings from Last 3 Encounters:  ?04/06/22 122/80  ?03/30/22 118/83  ?03/25/22 (!) 144/85  ? ?Wt Readings from Last 3 Encounters:  ?04/06/22 160 lb 12.8 oz (72.9 kg)  ?03/30/22 157 lb (71.2 kg)  ?03/25/22 163 lb 3.2 oz (74 kg)  ? ? ?Physical Exam ?Vitals reviewed.  ?Constitutional:   ?   Appearance: Normal appearance. She is well-developed.  ?Eyes:  ?   Conjunctiva/sclera: Conjunctivae normal.  ?Neck:  ?   Thyroid: No thyroid mass or thyromegaly.  ?Cardiovascular:  ?   Rate and Rhythm: Normal rate and regular rhythm.  ?   Pulses: Normal pulses.  ?   Heart sounds: Normal heart sounds.  ?Pulmonary:  ?   Effort: Pulmonary effort is normal.  ?   Breath sounds: Normal breath sounds. No wheezing, rhonchi or rales.  ?Chest:  ?Breasts: ?   Breasts are symmetrical.  ?   Right: No inverted nipple, mass, nipple discharge, skin change or tenderness.  ?   Left: No inverted nipple, mass, nipple discharge, skin change or tenderness.  ?Abdominal:  ?   General: Bowel sounds are normal. There is no distension.  ?   Palpations: Abdomen is soft. Abdomen is not rigid. There is no fluid wave or mass.  ?   Tenderness: There is no abdominal tenderness. There is no guarding or rebound.  ?Lymphadenopathy:  ?   Head:  ?   Right side of head: No submental, submandibular, tonsillar, preauricular, posterior auricular or occipital adenopathy.  ?   Left side of head: No submental, submandibular, tonsillar, preauricular, posterior auricular or occipital adenopathy.  ?   Cervical: No cervical adenopathy.  ?   Right cervical: No superficial, deep or posterior cervical adenopathy. ?   Left cervical: No  superficial, deep or posterior cervical adenopathy.  ?Skin: ?   General: Skin is warm and dry.  ?Neurological:  ?   Mental Status: She is alert.  ?Psychiatric:     ?   Speech: Speech normal.     ?   Behavior: Behavior normal.     ?   Thought Content: Thought content normal.  ? ? ?   ?Assessment & Plan:  ? ?Problem List Items Addressed This Visit   ? ?  ? Digestive  ? Chronic diarrhea  ?  Etiology unclear.  She is well appearing today. Benign abdominal exam.  Reviewed clonoscopy and she has follow up scheduled with Dr Dahlia Byes. Discussed trial of lactose free diet and FOD Map diet to see if symptoms were to improve. Advised trial of OTC prilosec for 2 weeks; she may consider this. Pending alpha gal test today as well.  ? ?  ?  ? Relevant Orders  ? Alpha-Gal Panel  ?  ? Other  ? Routine general medical examination at a health care facility  ?  Mammogram ordered and patient will schedule.  ?CBE performed. Deferred pelvic exam in the absence of complaints and pap is UTD. Encouraged continued walking.  ? ?  ?  ? Relevant Orders  ? CBC with Differential/Platelet  ? Comprehensive metabolic panel  ? Hemoglobin A1c  ? Lipid panel  ? TSH  ? VITAMIN D 25 Hydroxy (Vit-D Deficiency, Fractures)  ? ?Other Visit Diagnoses   ? ? Encounter for screening mammogram for malignant neoplasm of breast    -  Primary  ? Relevant Orders  ? MM 3D SCREEN BREAST BILATERAL  ? ?  ? ? ? ?I am having Iyania D. Defina maintain her fexofenadine, acetaminophen, aspirin-acetaminophen-caffeine, albuterol, Advair HFA, ALPRAZolam, traZODone, and amLODipine. ? ? ?No orders of the defined types were placed in this encounter. ? ? ?Return precautions given.  ? ?Risks, benefits, and alternatives of the medications and treatment plan prescribed today were discussed, and patient expressed understanding.  ? ?Education regarding symptom management and diagnosis given to patient on AVS.  ? ?Continue to follow with Burnard Hawthorne, FNP for routine health maintenance.   ? ?Noralyn D Manalo and I agreed with plan.  ? ?Mable Paris, FNP ? ? ? ?

## 2022-04-06 NOTE — Patient Instructions (Addendum)
Consider trial of 2 week OTC prilosec taken ideally an hour before breakfast.  ?Please let me know if you need anything at all.  ? ?Please schedule your mammogram at [facility] below. I have already ordered your mammogram ?  ?[facility] ?[id number] ? [place], [place] [address] ?Phone: [phone] ? ? ?Low-FODMAP Eating Plan ? ?FODMAP stands for fermentable oligosaccharides, disaccharides, monosaccharides, and polyols. These are sugars that are hard for some people to digest. A low-FODMAP eating plan may help some people who have irritable bowel syndrome (IBS) and certain other bowel (intestinal) diseases to manage their symptoms. ?This meal plan can be complicated to follow. Work with a diet and nutrition specialist (dietitian) to make a low-FODMAP eating plan that is right for you. A dietitian can help make sure that you get enough nutrition from this diet. ?What are tips for following this plan? ?Reading food labels ?Check labels for hidden FODMAPs such as: ?High-fructose syrup. ?Honey. ?Agave. ?Natural fruit flavors. ?Onion or garlic powder. ?Choose low-FODMAP foods that contain 3-4 grams of fiber per serving. ?Check food labels for serving sizes. Eat only one serving at a time to make sure FODMAP levels stay low. ?Shopping ?Shop with a list of foods that are recommended on this diet and make a meal plan. ?Meal planning ?Follow a low-FODMAP eating plan for up to 6 weeks, or as told by your health care provider or dietitian. ?To follow the eating plan: ?Eliminate high-FODMAP foods from your diet completely. Choose only low-FODMAP foods to eat. You will do this for 2-6 weeks. ?Gradually reintroduce high-FODMAP foods into your diet one at a time. Most people should wait a few days before introducing the next new high-FODMAP food into their meal plan. Your dietitian can recommend how quickly you may reintroduce foods. ?Keep a daily record of what and how much you eat and  drink. Make note of any symptoms that you have after eating. ?Review your daily record with a dietitian regularly to identify which foods you can eat and which foods you should avoid. ?General tips ?Drink enough fluid each day to keep your urine pale yellow. ?Avoid processed foods. These often have added sugar and may be high in FODMAPs. ?Avoid most dairy products, whole grains, and sweeteners. ?Work with a dietitian to make sure you get enough fiber in your diet. ?Avoid high FODMAP foods at meals to manage symptoms. ?Recommended foods ?Fruits ?Bananas, oranges, tangerines, lemons, limes, blueberries, raspberries, strawberries, grapes, cantaloupe, honeydew melon, kiwi, papaya, passion fruit, and pineapple. Limited amounts of dried cranberries, banana chips, and shredded coconut. ?Vegetables ?Eggplant, zucchini, cucumber, peppers, green beans, bean sprouts, lettuce, arugula, kale, Swiss chard, spinach, collard greens, bok choy, summer squash, potato, and tomato. Limited amounts of corn, carrot, and sweet potato. Green parts of scallions. ?Grains ?Gluten-free grains, such as rice, oats, buckwheat, quinoa, corn, polenta, and millet. Gluten-free pasta, bread, or cereal. Rice noodles. Corn tortillas. ?Meats and other proteins ?Unseasoned beef, pork, poultry, or fish. Eggs. Berniece Salines. Tofu (firm) and tempeh. Limited amounts of nuts and seeds, such as almonds, walnuts, [place] nuts, pecans, peanuts, nut butters, pumpkin seeds, chia seeds, and sunflower seeds. ?Dairy ?Lactose-free milk, yogurt, and kefir. Lactose-free cottage cheese and ice cream. Non-dairy milks, such as almond, coconut, hemp, and rice milk. Non-dairy yogurt. Limited amounts of goat cheese, brie, mozzarella, parmesan, swiss, and other hard cheeses. ?Fats and oils ?Butter-free spreads. Vegetable oils, such as olive, canola, and sunflower oil. ?Seasoning and other foods ?Artificial  sweeteners with names that do not end in "ol," such as aspartame, saccharine, and  stevia. Maple syrup, white table sugar, raw sugar, brown sugar, and molasses. Mayonnaise, soy sauce, and tamari. Fresh basil, coriander, parsley, rosemary, and thyme. ?Beverages ?Water and mineral water. Sugar-sweetened soft drinks. Small amounts of orange juice or cranberry juice. Black and green tea. Most dry wines. Coffee. ?The items listed above may not be a complete list of foods and beverages you can eat. Contact a dietitian for more information. ?Foods to avoid ?Fruits ?Fresh, dried, and juiced forms of apple, pear, watermelon, peach, plum, cherries, apricots, blackberries, boysenberries, figs, nectarines, and mango. Avocado. ?Vegetables ?Chicory root, artichoke, asparagus, cabbage, snow peas, Brussels sprouts, broccoli, sugar snap peas, mushrooms, celery, and cauliflower. Onions, garlic, leeks, and the white part of scallions. ?Grains ?Wheat, including kamut, durum, and semolina. Barley and bulgur. Couscous. Wheat-based cereals. Wheat noodles, bread, crackers, and pastries. ?Meats and other proteins ?Fried or fatty meat. Sausage. Cashews and pistachios. Soybeans, baked beans, black beans, chickpeas, kidney beans, fava beans, navy beans, lentils, black-eyed peas, and split peas. ?Dairy ?Milk, yogurt, ice cream, and soft cheese. Cream and sour cream. Milk-based sauces. Custard. Buttermilk. Soy milk. ?Seasoning and other foods ?Any sugar-free gum or candy. Foods that contain artificial sweeteners such as sorbitol, mannitol, isomalt, or xylitol. Foods that contain honey, high-fructose corn syrup, or agave. Bouillon, vegetable stock, beef stock, and chicken stock. Garlic and onion powder. Condiments made with onion, such as hummus, chutney, pickles, relish, salad dressing, and salsa. Tomato paste. ?Beverages ?Chicory-based drinks. Coffee substitutes. Chamomile tea. Fennel tea. Sweet or fortified wines such as port or sherry. Diet soft drinks made with isomalt, mannitol, maltitol, sorbitol, or xylitol. Apple,  pear, and mango juice. Juices with high-fructose corn syrup. ?The items listed above may not be a complete list of foods and beverages you should avoid. Contact a dietitian for more information. ?Summary ?FODMAP stands for fermentable oligosaccharides, disaccharides, monosaccharides, and polyols. These are sugars that are hard for some people to digest. ?A low-FODMAP eating plan is a short-term diet that helps to ease symptoms of certain bowel diseases. ?The eating plan usually lasts up to 6 weeks. After that, high-FODMAP foods are reintroduced gradually and one at a time. This can help you find out which foods may be causing symptoms. ?A low-FODMAP eating plan can be complicated. It is best to work with a dietitian who has experience with this type of plan. ?This information is not intended to replace advice given to you by your health care provider. Make sure you discuss any questions you have with your health care provider. ?Document Revised: 04/18/2020 Document Reviewed: 04/18/2020 ?Elsevier Patient Education ? [place]. ? ?

## 2022-04-06 NOTE — Assessment & Plan Note (Signed)
Mammogram ordered and patient will schedule.  ?CBE performed. Deferred pelvic exam in the absence of complaints and pap is UTD. Encouraged continued walking.  ?

## 2022-04-09 LAB — COMPREHENSIVE METABOLIC PANEL
ALT: 22 IU/L (ref 0–32)
AST: 18 IU/L (ref 0–40)
Albumin/Globulin Ratio: 1.8 (ref 1.2–2.2)
Albumin: 4.5 g/dL (ref 3.8–4.8)
Alkaline Phosphatase: 90 IU/L (ref 44–121)
BUN/Creatinine Ratio: 18 (ref 9–23)
BUN: 14 mg/dL (ref 6–24)
Bilirubin Total: 0.5 mg/dL (ref 0.0–1.2)
CO2: 24 mmol/L (ref 20–29)
Calcium: 9.4 mg/dL (ref 8.7–10.2)
Chloride: 102 mmol/L (ref 96–106)
Creatinine, Ser: 0.79 mg/dL (ref 0.57–1.00)
Globulin, Total: 2.5 g/dL (ref 1.5–4.5)
Glucose: 102 mg/dL — ABNORMAL HIGH (ref 70–99)
Potassium: 3.7 mmol/L (ref 3.5–5.2)
Sodium: 141 mmol/L (ref 134–144)
Total Protein: 7 g/dL (ref 6.0–8.5)
eGFR: 91 mL/min/{1.73_m2} (ref >59)

## 2022-04-09 LAB — CBC WITH DIFFERENTIAL/PLATELET
Basophils Absolute: 0.1 10*3/uL (ref 0.0–0.2)
Basos: 2 %
EOS (ABSOLUTE): 0.2 10*3/uL (ref 0.0–0.4)
Eos: 4 %
Hematocrit: 38 % (ref 34.0–46.6)
Hemoglobin: 13.1 g/dL (ref 11.1–15.9)
Immature Grans (Abs): 0 10*3/uL (ref 0.0–0.1)
Immature Granulocytes: 0 %
Lymphocytes Absolute: 1.6 10*3/uL (ref 0.7–3.1)
Lymphs: 34 %
MCH: 31 pg (ref 26.6–33.0)
MCHC: 34.5 g/dL (ref 31.5–35.7)
MCV: 90 fL (ref 79–97)
Monocytes Absolute: 0.3 10*3/uL (ref 0.1–0.9)
Monocytes: 6 %
Neutrophils Absolute: 2.6 10*3/uL (ref 1.4–7.0)
Neutrophils: 54 %
Platelets: 224 10*3/uL (ref 150–450)
RBC: 4.23 x10E6/uL (ref 3.77–5.28)
RDW: 14.5 % (ref 11.7–15.4)
WBC: 4.7 10*3/uL (ref 3.4–10.8)

## 2022-04-09 LAB — LIPID PANEL
Chol/HDL Ratio: 3.6 ratio (ref 0.0–4.4)
Cholesterol, Total: 207 mg/dL — ABNORMAL HIGH (ref 100–199)
HDL: 58 mg/dL (ref >39)
LDL Chol Calc (NIH): 133 mg/dL — ABNORMAL HIGH (ref 0–99)
Triglycerides: 91 mg/dL (ref 0–149)
VLDL Cholesterol Cal: 16 mg/dL (ref 5–40)

## 2022-04-09 LAB — ALPHA-GAL PANEL
Allergen Lamb IgE: <0.1 kU/L
Beef IgE: <0.1 kU/L
IgE (Immunoglobulin E), Serum: 11 IU/mL (ref 6–495)
O215-IgE Alpha-Gal: <0.1 kU/L
Pork IgE: <0.1 kU/L

## 2022-04-09 LAB — HEMOGLOBIN A1C
Est. average glucose Bld gHb Est-mCnc: 94 mg/dL
Hgb A1c MFr Bld: 4.9 % (ref 4.8–5.6)

## 2022-04-09 LAB — VITAMIN D 25 HYDROXY (VIT D DEFICIENCY, FRACTURES): Vit D, 25-Hydroxy: 23.4 ng/mL — ABNORMAL LOW (ref 30.0–100.0)

## 2022-04-09 LAB — TSH: TSH: 5 u[IU]/mL — ABNORMAL HIGH (ref 0.450–4.500)

## 2022-04-10 ENCOUNTER — Other Ambulatory Visit: Payer: Self-pay | Admitting: Family

## 2022-04-10 DIAGNOSIS — E559 Vitamin D deficiency, unspecified: Secondary | ICD-10-CM

## 2022-04-10 DIAGNOSIS — R899 Unspecified abnormal finding in specimens from other organs, systems and tissues: Secondary | ICD-10-CM

## 2022-04-10 MED ORDER — CHOLECALCIFEROL 1.25 MG (50000 UT) PO TABS: ORAL_TABLET | ORAL | 0 refills | Status: DC

## 2022-04-13 ENCOUNTER — Encounter: Payer: Self-pay | Admitting: Surgery

## 2022-04-13 ENCOUNTER — Ambulatory Visit: Payer: Managed Care, Other (non HMO) | Admitting: Surgery

## 2022-04-13 ENCOUNTER — Other Ambulatory Visit (INDEPENDENT_AMBULATORY_CARE_PROVIDER_SITE_OTHER): Payer: Managed Care, Other (non HMO)

## 2022-04-13 VITALS — BP 151/86 | HR 70 | Temp 98.0°F | Ht 66.5 in | Wt 159.8 lb

## 2022-04-13 DIAGNOSIS — K388 Other specified diseases of appendix: Secondary | ICD-10-CM

## 2022-04-13 DIAGNOSIS — R899 Unspecified abnormal finding in specimens from other organs, systems and tissues: Secondary | ICD-10-CM

## 2022-04-13 DIAGNOSIS — R8290 Unspecified abnormal findings in urine: Secondary | ICD-10-CM

## 2022-04-13 NOTE — Progress Notes (Signed)
Outpatient Surgical Follow Up ? ?04/13/2022 ? ?Briana Klein is an 51 y.o. female.  ? ?Chief Complaint  ?Patient presents with  ? Follow-up  ?  Mass of Appendix   ? ? 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  Pathology shows some chronic changes no evidence of dysplasia.  She does have some functional and chronic abdominal pain that is intermittent and does have diarrhea as well.  She does have an upcoming trip to [place] but will be back towards the end of May.  She is hoping to get the appendectomy in the beginning of June. ? ?Past Medical History:  ?Diagnosis Date  ? Abnormal stress test   ? Dr. Mancel Klein  ? Anxiety   ? Asthma   ? inhaler  ? Chronic diarrhea   ? history  ? Hyperlipidemia   ? Hypertension   ? Migraines   ? otc med prn  ? Seasonal allergies   ? SVD (spontaneous vaginal delivery)   ? x 3  ? Vitamin D deficiency   ? ? ?Past Surgical History:  ?Procedure Laterality Date  ? COLONOSCOPY N/A 03/30/2022  ? Procedure: COLONOSCOPY;  Surgeon: Briana Lame, MD;  Location: [facility];  Service: Endoscopy;  Laterality: N/A;  ? HYSTEROSCOPY WITH NOVASURE N/A 12/12/2013  ? Procedure: HYSTEROSCOPY WITH NOVASURE;  Surgeon: Briana Bores, MD;  Location: [facility];  Service: Gynecology;  Laterality: N/A;  ? ? ?Family History  ?Problem Relation Age of Onset  ? Hypertension Mother   ? Heart disease Father   ? Diabetes Father   ? Stroke Father   ? Heart disease Paternal Grandfather   ? Breast cancer Maternal Aunt   ? Colon cancer Maternal Aunt 80  ? Breast cancer Paternal Grandmother   ? Diabetes Other   ? Melanoma Neg Hx   ? ? ?Social History:  reports that she has never smoked. She has never used smokeless tobacco. She reports current alcohol use. She reports that she does not use drugs. ? ?Allergies:  ?Allergies   ?Allergen Reactions  ? Amoxicillin-Pot Clavulanate   ?  Passed out, has taken penicillin and amoxicillin w/no problems  ? ? ?Medications reviewed. ? ? ? ?ROS ?Full ROS performed and is otherwise negative other than what is stated in HPI ? ? ?BP (!) 151/86   Pulse 70   Temp 98 ?F (36.7 ?C) (Oral)   Ht 5' 6.5" (1.689 m)   Wt 159 lb 12.8 oz (72.5 kg)   SpO2 100%   BMI 25.41 kg/m?  ? ?Physical Exam ?Vitals and nursing note reviewed. Exam conducted with a chaperone present.  ?Constitutional:   ?   General: She is not in acute distress. ?   Appearance: Normal appearance.  ?Musculoskeletal:  ?   Cervical back: Normal range of motion and neck supple. No rigidity or tenderness.  ?Neurological:  ?   Mental Status: She is alert.  ?Psychiatric:     ?   Mood and Affect: Mood normal.     ?   Behavior: Behavior normal.     ?   Thought Content: Thought content normal.     ?   Judgment: Judgment normal.  ? ? 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  Given findings on colonoscopy and CT scan I definitely  recommend the ectomy.  Refer sent to establish a definitive diagnosis before moving forward with any other form of treatment.  Therefore I do recommend laparoscopic appendectomy.  Procedure discussed with the patient in detail.  Risk, benefits and possible complications including but not limited to: Bleeding, infection, stump blowout.  Also discussed with her the possibility of upgrading the pathology to a more serious condition that may require further operations.  She understands and wished to proceed. ?We will tentatively schedule her for June 1 ? ?I spent  40 minutes in this encounter including personally reviewing medical records, coordination of care, counseling the patient, placing orders and performing appropriate documentation. ? ?Briana Hamman, MD FACS ?General Surgeon  ?

## 2022-04-13 NOTE — Patient Instructions (Addendum)
Our surgery scheduler Barbara will call you within 24-48 hours to get you scheduled. If you have not heard from her after 48 hours, please call our office. Have the blue sheet available when she calls to write down important information.  If you have any concerns or questions, please feel free to call our office.   Laparoscopic Appendectomy, Adult  A laparoscopic appendectomy is a surgery to take out the appendix. The appendix is a finger-like structure that is attached to the large intestine. This procedure may be done to prevent an inflamed appendix from bursting (rupturing). It may also be done to treat the infection from an appendix that has ruptured. It is often done right after appendicitis is diagnosed. Appendicitis is inflammation of the appendix. In this surgery, your health care provider uses a thin, lighted tube with a camera (laparoscope) to take out the appendix through three small incisions. This is a minimally invasive surgery. It usually results in less pain, fewer problems, and a quicker recovery than surgery done through a large incision (open appendectomy). Tell a health care provider about: Any allergies you have. All medicines you are taking, including vitamins, herbs, eye drops, creams, and over-the-counter medicines. Any steroid use. This includes creams or steroids you take by mouth. Any problems you or family members have had with anesthetic medicines. Any bleeding problems you have. Any surgeries you have had. Any medical conditions you have. Whether you are pregnant or may be pregnant. What are the risks? Generally, this is a safe procedure. However, problems may occur, including: Infection. Bleeding. Damage to nearby structures or organs. Allergic reactions to medicines. A collection of pus (abscess). Blood clots in the legs. What happens before the procedure? When to stop eating and drinking Follow instructions from your health care provider about eating and  drinking restrictions. You may be asked not to eat or drink as soon as the diagnosis of appendicitis is made. Medicines Ask your health care provider about: Changing or stopping your regular medicines. This is especially important if you are taking diabetes medicines or blood thinners. Taking medicines such as aspirin and ibuprofen. These medicines can thin your blood. Do not take these medicines unless your health care provider tells you to take them. Taking over-the-counter medicines, vitamins, herbs, and supplements. General instructions If you will be going home right after the procedure, plan to have a responsible adult: Take you home from the hospital or clinic. You will not be allowed to drive. Care for you for the time you are told. Ask your health care provider: How your surgery site will be marked. What steps will be taken to help prevent infection. These steps may include: Removing hair at the surgery site. Washing skin with a germ-killing soap. Taking antibiotic medicine. What happens during the procedure?  An IV will be inserted into one of your veins. You will be given one or more of the following: A medicine to help you relax (sedative). A medicine to numb the area (local anesthetic). A medicine to make you fall asleep (general anesthetic). A thin, flexible tube (catheter) may be put into your bladder to drain urine. A tube may be passed through your nose or mouth and into your stomach (orogastric or nasogastric tube) to drain any stomach contents. Your surgeon will make three small incisions near your belly button (navel). A gas (carbon dioxide) will be used to fill your abdomen. The gas will make your abdomen expand. This helps the surgeon see clearly and gives him or   her more room to work. A laparoscope will be passed through one of the incisions. Other surgical instruments will be passed through the other incisions to assist in surgery. The appendix will be located and  removed through one of the incisions. The abdomen may be washed out to remove bacteria. The incisions will be closed with stitches (sutures), staples, or adhesive strips. A bandage (dressing) may be used to cover the incisions. If a tube was inserted into your bladder or stomach, it will be removed. The procedure may vary among health care providers and hospitals. What happens after the procedure? Your blood pressure, heart rate, breathing rate, and blood oxygen level will be monitored until you leave the hospital or clinic. You will be given medicines as needed to control pain and infection. If you were given a sedative during the procedure, it can affect you for several hours. Do not drive or operate machinery until your health care provider says that it is safe. If your appendix did not rupture, you may be able to go home the same day after your surgery. If your appendix ruptured: You will get antibiotic medicine through an IV line. You may be sent home with a temporary drain. Summary A laparoscopic appendectomy is a surgery to take out the appendix. The appendix is removed through three small incisions with the help of a thin, lighted tube that has a camera (laparoscope). This is a safe procedure, but there are some risks. Risks include bleeding, infection, allergic reaction to medicines, or damage to nearby organs. After the procedure, your blood pressure, heart rate, breathing rate, and blood oxygen level will be monitored until you leave the hospital or clinic. You will be given medicines as needed to control pain and infection. This information is not intended to replace advice given to you by your health care provider. Make sure you discuss any questions you have with your health care provider. Document Revised: 09/11/2021 Document Reviewed: 09/11/2021 Elsevier Patient Education  2023 [employer] Inc.  

## 2022-04-14 ENCOUNTER — Telehealth: Payer: Self-pay | Admitting: Surgery

## 2022-04-14 LAB — URINALYSIS, ROUTINE W REFLEX MICROSCOPIC
Bilirubin, UA: NEGATIVE
Glucose, UA: NEGATIVE
Ketones, UA: NEGATIVE
Leukocytes,UA: NEGATIVE
Nitrite, UA: NEGATIVE
Specific Gravity, UA: 1.023 (ref 1.005–1.030)
Urobilinogen, Ur: 0.2 mg/dL (ref 0.2–1.0)
pH, UA: 5.5 (ref 5.0–7.5)

## 2022-04-14 LAB — MICROSCOPIC EXAMINATION
Bacteria, UA: NONE SEEN
Casts: NONE SEEN /lpf
Epithelial Cells (non renal): NONE SEEN /hpf (ref 0–10)
RBC, Urine: >30 /hpf — AB (ref 0–2)

## 2022-04-14 LAB — TSH: TSH: 4.47 u[IU]/mL (ref 0.450–4.500)

## 2022-04-14 LAB — T4, FREE: Free T4: 1.22 ng/dL (ref 0.82–1.77)

## 2022-04-14 LAB — T3, FREE: T3, Free: 2.6 pg/mL (ref 2.0–4.4)

## 2022-04-14 NOTE — Telephone Encounter (Signed)
Outgoing call is made, left message for patient to call.  Please inform her of the following regarding scheduled surgery:  ? ?Pre-Admission date/time, COVID Testing date and Surgery date. ? ?Surgery Date: 05/14/22 ?Preadmission Testing Date: 05/06/22 (phone 8a-1p) ?Covid Testing Date: Not needed.    ? ?Also patient will need to call at [phone], between 1-3:00pm the day before surgery, to find out what time to arrive for surgery.   ? ?

## 2022-04-15 ENCOUNTER — Other Ambulatory Visit: Payer: Self-pay

## 2022-04-15 ENCOUNTER — Telehealth: Payer: Self-pay | Admitting: *Deleted

## 2022-04-15 DIAGNOSIS — R8290 Unspecified abnormal findings in urine: Secondary | ICD-10-CM

## 2022-04-15 NOTE — Telephone Encounter (Signed)
Please place future orders for lab appt.   ? ?PT USES [facility] ?

## 2022-04-15 NOTE — Telephone Encounter (Signed)
NOTED

## 2022-04-15 NOTE — Telephone Encounter (Signed)
Toya and jill ? ?I dont think we need further labs as long as urine culture results.  ? ?Can you see if in process? Appears collected ?

## 2022-04-15 NOTE — Telephone Encounter (Signed)
[facility] OFFICE TO SCHEDULE PATIENT TO LEAVE URINE. ?

## 2022-04-15 NOTE — Telephone Encounter (Signed)
Orders placed for [facility]   

## 2022-04-16 ENCOUNTER — Other Ambulatory Visit: Payer: Managed Care, Other (non HMO)

## 2022-04-16 DIAGNOSIS — R8290 Unspecified abnormal findings in urine: Secondary | ICD-10-CM

## 2022-04-16 LAB — URINE CULTURE

## 2022-04-17 LAB — MICROSCOPIC EXAMINATION
Bacteria, UA: NONE SEEN
Casts: NONE SEEN /lpf
Epithelial Cells (non renal): NONE SEEN /hpf (ref 0–10)
RBC, Urine: >30 /hpf — AB (ref 0–2)

## 2022-04-17 LAB — URINALYSIS, ROUTINE W REFLEX MICROSCOPIC
Bilirubin, UA: NEGATIVE
Glucose, UA: NEGATIVE
Ketones, UA: NEGATIVE
Leukocytes,UA: NEGATIVE
Nitrite, UA: NEGATIVE
Specific Gravity, UA: 1.022 (ref 1.005–1.030)
Urobilinogen, Ur: 1 mg/dL (ref 0.2–1.0)
pH, UA: 6 (ref 5.0–7.5)

## 2022-04-17 NOTE — Telephone Encounter (Signed)
Outgoing call is made again at both numbers and left message for patient to call.  Please inform her of the following regarding her scheduled surgery.   ?

## 2022-04-18 LAB — URINE CULTURE

## 2022-04-20 ENCOUNTER — Other Ambulatory Visit: Payer: Self-pay | Admitting: Family

## 2022-04-20 DIAGNOSIS — R319 Hematuria, unspecified: Secondary | ICD-10-CM

## 2022-04-20 NOTE — Telephone Encounter (Signed)
Another message is left for patient to call.   

## 2022-04-21 ENCOUNTER — Ambulatory Visit
Admission: RE | Admit: 2022-04-21 | Discharge: 2022-04-21 | Disposition: A | Discharge status: Home / Self Care | Payer: Self-pay | Source: Ambulatory Visit | Attending: *Deleted | Admitting: *Deleted

## 2022-04-21 ENCOUNTER — Other Ambulatory Visit: Payer: Self-pay | Admitting: *Deleted

## 2022-04-21 DIAGNOSIS — Z139 Encounter for screening, unspecified: Secondary | ICD-10-CM

## 2022-04-21 NOTE — Telephone Encounter (Signed)
Update to preop, patient will be on vacation. So preadmissions will be calling her on Friday May 26th 2023 between 8:00 am - 1:00 pm.  Patient is now aware of all dates regarding her surgery.  ? ?

## 2022-04-23 ENCOUNTER — Encounter: Payer: Self-pay | Admitting: Gastroenterology

## 2022-05-06 ENCOUNTER — Other Ambulatory Visit: Payer: Managed Care, Other (non HMO)

## 2022-05-08 ENCOUNTER — Other Ambulatory Visit: Payer: Self-pay

## 2022-05-08 ENCOUNTER — Encounter
Admission: RE | Admit: 2022-05-08 | Discharge: 2022-05-08 | Disposition: A | Discharge status: Home / Self Care | Payer: Managed Care, Other (non HMO) | Source: Ambulatory Visit | Attending: Surgery | Admitting: Surgery

## 2022-05-08 DIAGNOSIS — I480 Paroxysmal atrial fibrillation: Secondary | ICD-10-CM

## 2022-05-08 NOTE — Patient Instructions (Addendum)
Your procedure is scheduled on: 05/14/22 - Thursday Report to the Registration Desk on the 1st floor of the [facility]. To find out your arrival time, please call [phone] between 1PM - 3PM on: 05/13/22 - Wednesday  If your arrival time is 6:00 am, do not arrive prior to that time as the [facility] entrance doors do not open until 6:00 am.  REMEMBER: Instructions that are not followed completely may result in serious medical risk, up to and including death; or upon the discretion of your surgeon and anesthesiologist your surgery may need to be rescheduled.  Do not eat food after midnight the night before surgery.  No gum chewing, lozengers or hard candies.  You may however, drink CLEAR liquids up to 2 hours before you are scheduled to arrive for your surgery. Do not drink anything within 2 hours of your scheduled arrival time.  Clear liquids include: - water  - apple juice without pulp - gatorade (not RED colors) - black coffee or tea (Do NOT add milk or creamers to the coffee or tea) Do NOT drink anything that is not on this list.  TAKE THESE MEDICATIONS THE MORNING OF SURGERY WITH A SIP OF WATER:  - fluticasone-salmeterol (ADVAIR HFA) 115-21 MCG/ACT inhaler  Use albuterol (VENTOLIN HFA) 108 (90 Base) MCG/ACT inhaler on the day of surgery and bring to the hospital.  One week prior to surgery: Stop Anti-inflammatories (NSAIDS) such as Advil, Aleve, Ibuprofen, Motrin, Naproxen, Naprosyn and Aspirin based products such as Excedrin, Goodys Powder, BC Powder.  Stop ANY OVER THE COUNTER supplements until after surgery.  You may however, continue to take Tylenol if needed for pain up until the day of surgery.  No Alcohol for 24 hours before or after surgery.  No Smoking including e-cigarettes for 24 hours prior to surgery.  No chewable tobacco products for at least 6 hours prior to surgery.  No nicotine patches on the day of surgery.  Do not use any "recreational" drugs for  at least a week prior to your surgery.  Please be advised that the combination of cocaine and anesthesia may have negative outcomes, up to and including death. If you test positive for cocaine, your surgery will be cancelled.  On the morning of surgery brush your teeth with toothpaste and water, you may rinse your mouth with mouthwash if you wish. Do not swallow any toothpaste or mouthwash.  Use CHG Soap or wipes as directed on instruction sheet.  Do not wear jewelry, make-up, hairpins, clips or nail polish.  Do not wear lotions, powders, or perfumes.   Do not shave body from the neck down 48 hours prior to surgery just in case you cut yourself which could leave a site for infection.  Also, freshly shaved skin may become irritated if using the CHG soap.  Contact lenses, hearing aids and dentures may not be worn into surgery.  Do not bring valuables to the hospital. [facility] is not responsible for any missing/lost belongings or valuables.   Notify your doctor if there is any change in your medical condition (cold, fever, infection).  Wear comfortable clothing (specific to your surgery type) to the hospital.  After surgery, you can help prevent lung complications by doing breathing exercises.  Take deep breaths and cough every 1-2 hours. Your doctor may order a device called an Incentive Spirometer to help you take deep breaths. When coughing or sneezing, hold a pillow firmly against your incision with both hands. This is called "  splinting." Doing this helps protect your incision. It also decreases belly discomfort.  If you are being admitted to the hospital overnight, leave your suitcase in the car. After surgery it may be brought to your room.  If you are being discharged the day of surgery, you will not be allowed to drive home. You will need a responsible adult (18 years or older) to drive you home and stay with you that night.   If you are taking public transportation, you  will need to have a responsible adult (18 years or older) with you. Please confirm with your physician that it is acceptable to use public transportation.   Please call the [facility] at [phone] if you have any questions about these instructions.  Surgery Visitation Policy:  Patients undergoing a surgery or procedure may have two family members or support persons with them as long as the person is not COVID-19 positive or experiencing its symptoms.   Inpatient Visitation:    Visiting hours are 7 a.m. to 8 p.m. Up to four visitors are allowed at one time in a patient room, including children. The visitors may rotate out with other people during the day. One designated support person (adult) may remain overnight.

## 2022-05-11 ENCOUNTER — Encounter: Payer: Self-pay | Admitting: Urgent Care

## 2022-05-12 ENCOUNTER — Inpatient Hospital Stay: Admission: RE | Admit: 2022-05-12 | Payer: Managed Care, Other (non HMO) | Source: Ambulatory Visit

## 2022-05-12 ENCOUNTER — Telehealth: Payer: Self-pay | Admitting: Surgery

## 2022-05-12 NOTE — Telephone Encounter (Signed)
Patient calls to cancel surgery for 05/14/22.  She has Covid and has been running fever, not well at all.  I told her that we can reschedule 10 days to 2 weeks from now.  States she will call us back when she is feeling better and look at her schedule for rescheduling surgery.

## 2022-05-14 ENCOUNTER — Encounter: Admission: RE | Payer: Self-pay | Source: Home / Self Care

## 2022-05-14 ENCOUNTER — Ambulatory Visit: Admission: RE | Admit: 2022-05-14 | Payer: Managed Care, Other (non HMO) | Source: Home / Self Care | Admitting: Surgery

## 2022-05-14 SURGERY — APPENDECTOMY, LAPAROSCOPIC
Anesthesia: General

## 2022-05-18 ENCOUNTER — Ambulatory Visit: Payer: Managed Care, Other (non HMO) | Admitting: Surgery

## 2022-05-19 ENCOUNTER — Ambulatory Visit: Payer: Managed Care, Other (non HMO) | Admitting: Gastroenterology

## 2022-05-28 ENCOUNTER — Ambulatory Visit: Payer: Managed Care, Other (non HMO) | Admitting: Urology

## 2022-05-28 ENCOUNTER — Ambulatory Visit
Admission: RE | Admit: 2022-05-28 | Discharge: 2022-05-28 | Disposition: A | Discharge status: Home / Self Care | Payer: Managed Care, Other (non HMO) | Source: Ambulatory Visit | Attending: Urology | Admitting: Urology

## 2022-05-28 VITALS — BP 128/83 | HR 64 | Ht 66.5 in | Wt 160.0 lb

## 2022-05-28 DIAGNOSIS — R3129 Other microscopic hematuria: Secondary | ICD-10-CM

## 2022-05-28 LAB — MICROSCOPIC EXAMINATION

## 2022-05-28 LAB — URINALYSIS, COMPLETE
Bilirubin, UA: NEGATIVE
Glucose, UA: NEGATIVE
Ketones, UA: NEGATIVE
Nitrite, UA: NEGATIVE
Specific Gravity, UA: 1.02 (ref 1.005–1.030)
Urobilinogen, Ur: 0.2 mg/dL (ref 0.2–1.0)
pH, UA: 6.5 (ref 5.0–7.5)

## 2022-05-28 NOTE — Progress Notes (Signed)
05/28/2022 3:27 PM   Briana Klein [birth year] [id number]  Referring provider: Burnard Hawthorne, FNP [address] [place],  [place] [address]  No chief complaint on file.   HPI: Briana Klein is a 51 y.o. fe female   PMH: Past Medical History:  Diagnosis Date   Abnormal stress test    Dr. Mancel Parsons   Anxiety    Asthma    inhaler   Chronic diarrhea    history   Hyperlipidemia    Hypertension    Migraines    otc med prn   Seasonal allergies    SVD (spontaneous vaginal delivery)    x 3   Vitamin D deficiency     Surgical History: Past Surgical History:  Procedure Laterality Date   COLONOSCOPY N/A 03/30/2022   Procedure: COLONOSCOPY;  Surgeon: Lucilla Lame, MD;  Location: [facility];  Service: Endoscopy;  Laterality: N/A;   HYSTEROSCOPY WITH NOVASURE N/A 12/12/2013   Procedure: HYSTEROSCOPY WITH NOVASURE;  Surgeon: Logan Bores, MD;  Location: [facility];  Service: Gynecology;  Laterality: N/A;    Home Medications:  Allergies as of 05/28/2022       Reactions   Amoxicillin-pot Clavulanate    Passed out, has taken penicillin and amoxicillin w/no problems        Medication List        Accurate as of May 28, 2022  3:27 PM. If you have any questions, ask your nurse or doctor.          acetaminophen 325 MG tablet Commonly known as: TYLENOL Take 650 mg by mouth every 6 (six) hours as needed.   Advair HFA 115-21 MCG/ACT inhaler Generic drug: fluticasone-salmeterol INHALE 2 PUFFS INTO THE LUNGS TWICE DAILY AS NEEDED   albuterol 108 (90 Base) MCG/ACT inhaler Commonly known as: Ventolin HFA INHALE 2 PUFFS BY MOUTH INTO THE LUNGS EVERY 6 HOURS AS NEEDED   ALPRAZolam 0.25 MG tablet Commonly known as: XANAX Take 1 tablet (0.25 mg total) by mouth daily as needed for anxiety.   amLODipine 5 MG tablet Commonly known as: NORVASC TAKE 1 TABLET(5 MG) BY MOUTH DAILY   aspirin-acetaminophen-caffeine 250-250-65 MG tablet Commonly  known as: EXCEDRIN MIGRAINE Take by mouth every 6 (six) hours as needed for headache.   Cholecalciferol 1.25 MG (50000 UT) Tabs 50,000 units PO qwk for 8 weeks.   fexofenadine 180 MG tablet Commonly known as: ALLEGRA Take 180 mg by mouth as needed for allergies or rhinitis.   traZODone 50 MG tablet Commonly known as: DESYREL TAKE 1 TABLET(50 MG) BY MOUTH AT BEDTIME AS NEEDED FOR SLEEP        Allergies:  Allergies  Allergen Reactions   Amoxicillin-Pot Clavulanate     Passed out, has taken penicillin and amoxicillin w/no problems    Family History: Family History  Problem Relation Age of Onset   Hypertension Mother    Heart disease Father    Diabetes Father    Stroke Father    Heart disease Paternal Grandfather    Breast cancer Maternal Aunt    Colon cancer Maternal Aunt 80   Breast cancer Paternal Grandmother    Diabetes Other    Melanoma Neg Hx     Social History:  reports that she has never smoked. She has never used smokeless tobacco. She reports current alcohol use. She reports that she does not use drugs.   Physical Exam: There were no vitals taken for this visit.  Constitutional:  Alert and oriented, No acute distress. HEENT: [place] AT, moist mucus membranes.  Trachea midline, no masses. Cardiovascular: No clubbing, cyanosis, or edema. Respiratory: Normal respiratory effort, no increased work of breathing. GI: Abdomen is soft, nontender, nondistended, no abdominal masses GU: No CVA tenderness Skin: No rashes, bruises or suspicious lesions. Neurologic: Grossly intact, no focal deficits, moving all 4 extremities. Psychiatric: Normal mood and affect.  Laboratory Data: Lab Results  Component Value Date   WBC 4.7 04/06/2022   HGB 13.1 04/06/2022   HCT 38.0 04/06/2022   MCV 90 04/06/2022   PLT 224 04/06/2022    Lab Results  Component Value Date   CREATININE 0.79 04/06/2022    No results found for: "PSA"  No results found for: "TESTOSTERONE"  Lab  Results  Component Value Date   HGBA1C 4.9 04/06/2022    Urinalysis    Component Value Date/Time   COLORURINE Yellow 03/03/2013 1349   APPEARANCEUR Clear 04/16/2022 1532   LABSPEC 1.013 03/03/2013 1349   PHURINE 7.0 03/03/2013 1349   GLUCOSEU Negative 04/16/2022 1532   GLUCOSEU Negative 03/03/2013 1349   HGBUR 2+ 03/03/2013 1349   BILIRUBINUR Negative 04/16/2022 1532   BILIRUBINUR Negative 03/03/2013 1349   KETONESUR Negative 03/03/2013 1349   PROTEINUR 1+ (A) 04/16/2022 1532   PROTEINUR Negative 03/03/2013 1349   NITRITE Negative 04/16/2022 1532   NITRITE Negative 03/03/2013 1349   LEUKOCYTESUR Negative 04/16/2022 1532   LEUKOCYTESUR Negative 03/03/2013 1349    Lab Results  Component Value Date   LABMICR See below: 04/16/2022   WBCUA 0-5 04/16/2022   LABEPIT None seen 04/16/2022   BACTERIA None seen 04/16/2022    Pertinent Imaging: *** No results found for this or any previous visit.  No results found for this or any previous visit.  No results found for this or any previous visit.  No results found for this or any previous visit.  No results found for this or any previous visit.  No results found for this or any previous visit.  No results found for this or any previous visit.  No results found for this or any previous visit.   Assessment & Plan:    1. Microscopic hematuria *** - Urinalysis, Complete   No follow-ups on file.  Abbie Sons, [facility] [address] [place], [place] [address] [phone]

## 2022-05-29 ENCOUNTER — Encounter: Payer: Self-pay | Admitting: Urology

## 2022-05-29 ENCOUNTER — Other Ambulatory Visit: Payer: Self-pay | Admitting: Urology

## 2022-05-29 DIAGNOSIS — R3129 Other microscopic hematuria: Secondary | ICD-10-CM

## 2022-05-31 ENCOUNTER — Encounter: Payer: Self-pay | Admitting: Urology

## 2022-06-02 ENCOUNTER — Ambulatory Visit: Payer: Managed Care, Other (non HMO) | Admitting: Gastroenterology

## 2022-06-05 ENCOUNTER — Ambulatory Visit
Admission: RE | Admit: 2022-06-05 | Discharge: 2022-06-05 | Disposition: A | Discharge status: Home / Self Care | Payer: Managed Care, Other (non HMO) | Source: Ambulatory Visit | Attending: Urology | Admitting: Urology

## 2022-06-05 DIAGNOSIS — R3129 Other microscopic hematuria: Secondary | ICD-10-CM | POA: Insufficient documentation

## 2022-06-05 LAB — POCT I-STAT CREATININE: Creatinine, Ser: 0.8 mg/dL (ref 0.44–1.00)

## 2022-06-05 MED ORDER — IOHEXOL 300 MG/ML  SOLN
125.0000 mL | Freq: Once | INTRAMUSCULAR | Status: AC | PRN
  Administered 2022-06-05: 125 mL via INTRAVENOUS

## 2022-06-08 ENCOUNTER — Encounter: Payer: Self-pay | Admitting: Surgery

## 2022-06-08 ENCOUNTER — Ambulatory Visit: Payer: Managed Care, Other (non HMO) | Admitting: Surgery

## 2022-06-08 ENCOUNTER — Other Ambulatory Visit: Payer: Self-pay

## 2022-06-08 VITALS — BP 136/90 | HR 58 | Temp 97.9°F | Ht 66.5 in | Wt 161.0 lb

## 2022-06-08 DIAGNOSIS — K388 Other specified diseases of appendix: Secondary | ICD-10-CM | POA: Diagnosis not present

## 2022-06-09 ENCOUNTER — Telehealth: Payer: Self-pay

## 2022-06-09 NOTE — Telephone Encounter (Signed)
Message left for the patient to call for surgery information.    Surgery Date: 07/09/22 Preadmission Testing Date: 07/02/22 (phone 1P-5P)  Patient has been made aware to call [phone], between 1-3:00pm the day before surgery, to find out what time to arrive for surgery.

## 2022-06-12 ENCOUNTER — Encounter: Payer: Self-pay | Admitting: Urology

## 2022-07-02 ENCOUNTER — Inpatient Hospital Stay: Admission: RE | Admit: 2022-07-02 | Payer: Managed Care, Other (non HMO) | Source: Ambulatory Visit

## 2022-07-06 ENCOUNTER — Encounter
Admission: RE | Admit: 2022-07-06 | Discharge: 2022-07-06 | Disposition: A | Discharge status: Home / Self Care | Payer: Managed Care, Other (non HMO) | Source: Ambulatory Visit | Attending: Surgery | Admitting: Surgery

## 2022-07-06 VITALS — Ht 66.5 in | Wt 160.0 lb

## 2022-07-06 DIAGNOSIS — I10 Essential (primary) hypertension: Secondary | ICD-10-CM

## 2022-07-06 HISTORY — DX: Personal history of urinary calculi: Z87.442

## 2022-07-06 HISTORY — DX: Cardiac arrhythmia, unspecified: I49.9

## 2022-07-06 NOTE — Patient Instructions (Addendum)
Your procedure is scheduled on: Thursday July 09, 2022. Report to Day Surgery inside [facility] 2nd floor, stop by admissions desk before getting on elevator.  To find out your arrival time please call [phone] between 1PM - 3PM on Wednesday July 08, 2022.  Remember: Instructions that are not followed completely may result in serious medical risk,  up to and including death, or upon the discretion of your surgeon and anesthesiologist your  surgery may need to be rescheduled.     _X__ 1. Do not eat food after midnight the night before your procedure.                 No chewing gum or hard candies. You may drink clear liquids up to 2 hours                 before you are scheduled to arrive for your surgery- DO not drink clear                 liquids within 2 hours of the start of your surgery.                 Clear Liquids include:  water, apple juice without pulp, clear Gatorade, G2 or                  Gatorade Zero (avoid Red/Purple/Blue), Black Coffee or Tea (Do not add                 anything to coffee or tea).  __X__2.  On the morning of surgery brush your teeth with toothpaste and water, you                may rinse your mouth with mouthwash if you wish.  Do not swallow any toothpaste or mouthwash.     _X__ 3.  No Alcohol for 24 hours before or after surgery.   _X__ 4.  Do Not Smoke or use e-cigarettes For 24 Hours Prior to Your Surgery.                 Do not use any chewable tobacco products for at least 6 hours prior to                 Surgery.  _X__  5.  Do not use any recreational drugs (marijuana, cocaine, heroin, ecstasy, MDMA or other)                For at least one week prior to your surgery.  Combination of these drugs with anesthesia                May have life threatening results.  ____  6.  Bring all medications with you on the day of surgery if instructed.   __X__  7.  Notify your doctor if there is any change in your medical  condition      (cold, fever, infections).     Do not wear jewelry, make-up, hairpins, clips or nail polish. Do not wear lotions, powders, or perfumes. You may wear deodorant. Do not shave 48 hours prior to surgery.  Do not bring valuables to the hospital.    [facility] is not responsible for any belongings or valuables.  Contacts, dentures or bridgework may not be worn into surgery. Leave your suitcase in the car. After surgery it may be brought to your room. For patients admitted to the hospital, discharge time is determined by your treatment team.  Patients discharged the day of surgery will not be allowed to drive home.   Make arrangements for someone to be with you for the first 24 hours of your Same Day Discharge.   __X__ Take these medicines the morning of surgery with A SIP OF WATER:    1. ALPRAZolam (XANAX) 0.25 MG   2.   3.   4.  5.  6.  ____ Fleet Enema (as directed)   __X__ Use CHG Soap (Dial Antibacterial soap) as directed  ____ Use Benzoyl Peroxide Gel as instructed  __X__ Use inhalers on the day of surgery  fluticasone-salmeterol (ADVAIR HFA) 115-21 MCG/ACT inhaler  albuterol (VENTOLIN HFA) 108 (90 Base) MCG/ACT inhaler ____ Stop metformin 2 days prior to surgery    ____ Take 1/2 of usual insulin dose the night before surgery. No insulin the morning          of surgery.   ____ Call your PCP, cardiologist, or Pulmonologist if taking Coumadin/Plavix/aspirin and ask when to stop before your surgery.   __X__ One Week prior to surgery- Stop Anti-inflammatories such as Ibuprofen, Aleve, Advil, Motrin, meloxicam (MOBIC), diclofenac, etodolac, ketorolac, Toradol, Daypro, piroxicam, Goody's or BC powders. OK TO USE TYLENOL IF NEEDED   __X__ Stop supplements until after surgery.    ____ Bring C-Pap to the hospital.    If you have any questions regarding your pre-procedure instructions,  Please call Pre-admit Testing at [phone]

## 2022-07-07 ENCOUNTER — Inpatient Hospital Stay: Admission: RE | Admit: 2022-07-07 | Payer: Managed Care, Other (non HMO) | Source: Ambulatory Visit

## 2022-07-07 ENCOUNTER — Encounter
Admission: RE | Admit: 2022-07-07 | Discharge: 2022-07-07 | Disposition: A | Discharge status: Home / Self Care | Payer: Managed Care, Other (non HMO) | Source: Ambulatory Visit | Attending: Surgery | Admitting: Surgery

## 2022-07-07 DIAGNOSIS — Z01810 Encounter for preprocedural cardiovascular examination: Secondary | ICD-10-CM | POA: Insufficient documentation

## 2022-07-07 DIAGNOSIS — I10 Essential (primary) hypertension: Secondary | ICD-10-CM | POA: Insufficient documentation

## 2022-07-09 ENCOUNTER — Ambulatory Visit: Payer: Managed Care, Other (non HMO) | Admitting: Urgent Care

## 2022-07-09 ENCOUNTER — Encounter: Payer: Self-pay | Admitting: Surgery

## 2022-07-09 ENCOUNTER — Other Ambulatory Visit: Payer: Self-pay

## 2022-07-09 ENCOUNTER — Ambulatory Visit
Admission: RE | Admit: 2022-07-09 | Discharge: 2022-07-09 | Disposition: A | Discharge status: Home / Self Care | Payer: Managed Care, Other (non HMO) | Attending: Surgery | Admitting: Surgery

## 2022-07-09 ENCOUNTER — Encounter
Admission: RE | Disposition: A | Discharge status: Home / Self Care | Payer: Self-pay | Source: Home / Self Care | Attending: Surgery

## 2022-07-09 DIAGNOSIS — G8929 Other chronic pain: Secondary | ICD-10-CM | POA: Insufficient documentation

## 2022-07-09 DIAGNOSIS — Z8616 Personal history of COVID-19: Secondary | ICD-10-CM | POA: Diagnosis not present

## 2022-07-09 DIAGNOSIS — K388 Other specified diseases of appendix: Secondary | ICD-10-CM

## 2022-07-09 DIAGNOSIS — K635 Polyp of colon: Secondary | ICD-10-CM | POA: Diagnosis not present

## 2022-07-09 DIAGNOSIS — R197 Diarrhea, unspecified: Secondary | ICD-10-CM | POA: Diagnosis not present

## 2022-07-09 DIAGNOSIS — R319 Hematuria, unspecified: Secondary | ICD-10-CM | POA: Diagnosis not present

## 2022-07-09 HISTORY — PX: LAPAROSCOPIC APPENDECTOMY: SHX408

## 2022-07-09 SURGERY — APPENDECTOMY, LAPAROSCOPIC
Anesthesia: General

## 2022-07-09 MED ORDER — BUPIVACAINE-EPINEPHRINE 0.25% -1:200000 IJ SOLN
INTRAMUSCULAR | Status: DC | PRN
  Administered 2022-07-09: 30 mL

## 2022-07-09 MED ORDER — OXYCODONE HCL 5 MG PO TABS
5.0000 mg | ORAL_TABLET | Freq: Once | ORAL | Status: AC | PRN
  Administered 2022-07-09: 5 mg via ORAL

## 2022-07-09 MED ORDER — CELECOXIB 200 MG PO CAPS: 200.0000 mg | ORAL_CAPSULE | ORAL | Status: AC

## 2022-07-09 MED ORDER — CHLORHEXIDINE GLUCONATE 0.12 % MT SOLN: 15.0000 mL | Freq: Once | OROMUCOSAL | Status: AC

## 2022-07-09 MED ORDER — BUPIVACAINE LIPOSOME 1.3 % IJ SUSP
INTRAMUSCULAR | Status: DC | PRN
  Administered 2022-07-09: 20 mL

## 2022-07-09 MED ORDER — CHLORHEXIDINE GLUCONATE CLOTH 2 % EX PADS: 6.0000 | MEDICATED_PAD | Freq: Once | CUTANEOUS | Status: DC

## 2022-07-09 MED ORDER — GABAPENTIN 300 MG PO CAPS: 300.0000 mg | ORAL_CAPSULE | ORAL | Status: AC

## 2022-07-09 MED ORDER — DEXMEDETOMIDINE (PRECEDEX) IN NS 20 MCG/5ML (4 MCG/ML) IV SYRINGE
PREFILLED_SYRINGE | INTRAVENOUS | Status: DC | PRN
  Administered 2022-07-09 (×2): 8 ug via INTRAVENOUS

## 2022-07-09 MED ORDER — FENTANYL CITRATE (PF) 100 MCG/2ML IJ SOLN
INTRAMUSCULAR | Status: AC
  Filled 2022-07-09: qty 2

## 2022-07-09 MED ORDER — SUGAMMADEX SODIUM 200 MG/2ML IV SOLN
INTRAVENOUS | Status: DC | PRN
  Administered 2022-07-09: 300 mg via INTRAVENOUS

## 2022-07-09 MED ORDER — FENTANYL CITRATE (PF) 100 MCG/2ML IJ SOLN
25.0000 ug | INTRAMUSCULAR | Status: DC | PRN
  Administered 2022-07-09: 50 ug via INTRAVENOUS
  Administered 2022-07-09: 25 ug via INTRAVENOUS

## 2022-07-09 MED ORDER — LIDOCAINE HCL (PF) 2 % IJ SOLN
INTRAMUSCULAR | Status: AC
  Filled 2022-07-09: qty 5

## 2022-07-09 MED ORDER — LIDOCAINE HCL (CARDIAC) PF 100 MG/5ML IV SOSY
PREFILLED_SYRINGE | INTRAVENOUS | Status: DC | PRN
  Administered 2022-07-09: 100 mg via INTRAVENOUS

## 2022-07-09 MED ORDER — GABAPENTIN 300 MG PO CAPS
ORAL_CAPSULE | ORAL | Status: AC
  Administered 2022-07-09: 300 mg via ORAL
  Filled 2022-07-09: qty 1

## 2022-07-09 MED ORDER — LACTATED RINGERS IV SOLN: INTRAVENOUS | Status: DC | PRN

## 2022-07-09 MED ORDER — CELECOXIB 200 MG PO CAPS
ORAL_CAPSULE | ORAL | Status: AC
  Administered 2022-07-09: 200 mg via ORAL
  Filled 2022-07-09: qty 1

## 2022-07-09 MED ORDER — ROCURONIUM BROMIDE 100 MG/10ML IV SOLN
INTRAVENOUS | Status: DC | PRN
  Administered 2022-07-09: 70 mg via INTRAVENOUS

## 2022-07-09 MED ORDER — BUPIVACAINE-EPINEPHRINE (PF) 0.25% -1:200000 IJ SOLN
INTRAMUSCULAR | Status: AC
  Filled 2022-07-09: qty 30

## 2022-07-09 MED ORDER — OXYCODONE HCL 5 MG/5ML PO SOLN: 5.0000 mg | Freq: Once | ORAL | Status: AC | PRN

## 2022-07-09 MED ORDER — HYDROCODONE-ACETAMINOPHEN 5-325 MG PO TABS: 1.0000 | ORAL_TABLET | Freq: Four times a day (QID) | ORAL | 0 refills | Status: DC | PRN

## 2022-07-09 MED ORDER — ORAL CARE MOUTH RINSE: 15.0000 mL | Freq: Once | OROMUCOSAL | Status: AC

## 2022-07-09 MED ORDER — SODIUM CHLORIDE 0.9 % IV SOLN
2.0000 g | INTRAVENOUS | Status: AC
  Administered 2022-07-09: 2 g via INTRAVENOUS

## 2022-07-09 MED ORDER — MIDAZOLAM HCL 2 MG/2ML IJ SOLN
INTRAMUSCULAR | Status: DC | PRN
  Administered 2022-07-09: 2 mg via INTRAVENOUS

## 2022-07-09 MED ORDER — ONDANSETRON HCL 4 MG/2ML IJ SOLN
INTRAMUSCULAR | Status: AC
  Filled 2022-07-09: qty 2

## 2022-07-09 MED ORDER — FAMOTIDINE 20 MG PO TABS: 20.0000 mg | ORAL_TABLET | Freq: Once | ORAL | Status: AC

## 2022-07-09 MED ORDER — PROPOFOL 10 MG/ML IV BOLUS
INTRAVENOUS | Status: DC | PRN
  Administered 2022-07-09: 150 mg via INTRAVENOUS

## 2022-07-09 MED ORDER — LACTATED RINGERS IV SOLN: INTRAVENOUS | Status: DC

## 2022-07-09 MED ORDER — BUPIVACAINE LIPOSOME 1.3 % IJ SUSP
INTRAMUSCULAR | Status: AC
  Filled 2022-07-09: qty 20

## 2022-07-09 MED ORDER — MIDAZOLAM HCL 2 MG/2ML IJ SOLN
INTRAMUSCULAR | Status: AC
  Filled 2022-07-09: qty 2

## 2022-07-09 MED ORDER — FAMOTIDINE 20 MG PO TABS
ORAL_TABLET | ORAL | Status: AC
  Administered 2022-07-09: 20 mg via ORAL
  Filled 2022-07-09: qty 1

## 2022-07-09 MED ORDER — CHLORHEXIDINE GLUCONATE 0.12 % MT SOLN
OROMUCOSAL | Status: AC
  Administered 2022-07-09: 15 mL via OROMUCOSAL
  Filled 2022-07-09: qty 15

## 2022-07-09 MED ORDER — FENTANYL CITRATE (PF) 100 MCG/2ML IJ SOLN
INTRAMUSCULAR | Status: DC | PRN
  Administered 2022-07-09 (×2): 50 ug via INTRAVENOUS

## 2022-07-09 MED ORDER — ACETAMINOPHEN 500 MG PO TABS: 1000.0000 mg | ORAL_TABLET | ORAL | Status: AC

## 2022-07-09 MED ORDER — FENTANYL CITRATE (PF) 100 MCG/2ML IJ SOLN
INTRAMUSCULAR | Status: AC
  Administered 2022-07-09: 25 ug via INTRAVENOUS
  Filled 2022-07-09: qty 2

## 2022-07-09 MED ORDER — OXYCODONE HCL 5 MG PO TABS
ORAL_TABLET | ORAL | Status: AC
  Filled 2022-07-09: qty 1

## 2022-07-09 MED ORDER — DEXAMETHASONE SODIUM PHOSPHATE 10 MG/ML IJ SOLN
INTRAMUSCULAR | Status: AC
  Filled 2022-07-09: qty 1

## 2022-07-09 MED ORDER — ONDANSETRON HCL 4 MG/2ML IJ SOLN
INTRAMUSCULAR | Status: DC | PRN
  Administered 2022-07-09: 4 mg via INTRAVENOUS

## 2022-07-09 MED ORDER — 0.9 % SODIUM CHLORIDE (POUR BTL) OPTIME
TOPICAL | Status: DC | PRN
  Administered 2022-07-09: 200 mL

## 2022-07-09 MED ORDER — ROCURONIUM BROMIDE 10 MG/ML (PF) SYRINGE
PREFILLED_SYRINGE | INTRAVENOUS | Status: AC
  Filled 2022-07-09: qty 10

## 2022-07-09 MED ORDER — DEXAMETHASONE SODIUM PHOSPHATE 10 MG/ML IJ SOLN
INTRAMUSCULAR | Status: DC | PRN
  Administered 2022-07-09: 10 mg via INTRAVENOUS

## 2022-07-09 MED ORDER — SODIUM CHLORIDE 0.9 % IV SOLN
INTRAVENOUS | Status: AC
  Filled 2022-07-09: qty 2

## 2022-07-09 MED ORDER — ACETAMINOPHEN 500 MG PO TABS
ORAL_TABLET | ORAL | Status: AC
  Administered 2022-07-09: 1000 mg via ORAL
  Filled 2022-07-09: qty 2

## 2022-07-09 SURGICAL SUPPLY — 40 items
ADH SKN CLS APL DERMABOND .7 (GAUZE/BANDAGES/DRESSINGS) ×1
APPLIER CLIP 5 13 M/L LIGAMAX5 (MISCELLANEOUS)
APR CLP MED LRG 5 ANG JAW (MISCELLANEOUS)
BLADE CLIPPER SURG (BLADE) ×1 IMPLANT
CLIP APPLIE 5 13 M/L LIGAMAX5 (MISCELLANEOUS) ×1 IMPLANT
CUTTER FLEX LINEAR 45M (STAPLE) ×2 IMPLANT
DERMABOND ADVANCED (GAUZE/BANDAGES/DRESSINGS) ×1
DERMABOND ADVANCED .7 DNX12 (GAUZE/BANDAGES/DRESSINGS) ×1 IMPLANT
ELECT CAUTERY BLADE 6.4 (BLADE) ×2 IMPLANT
ELECT REM PT RETURN 9FT ADLT (ELECTROSURGICAL) ×2
ELECTRODE REM PT RTRN 9FT ADLT (ELECTROSURGICAL) ×1 IMPLANT
GLOVE BIO SURGEON STRL SZ7 (GLOVE) ×6 IMPLANT
GOWN STRL REUS W/ TWL LRG LVL3 (GOWN DISPOSABLE) ×2 IMPLANT
GOWN STRL REUS W/TWL LRG LVL3 (GOWN DISPOSABLE) ×6
IRRIGATION STRYKERFLOW (MISCELLANEOUS) ×1 IMPLANT
IRRIGATOR STRYKERFLOW (MISCELLANEOUS) ×2
IV NS 1000ML (IV SOLUTION) ×2
IV NS 1000ML BAXH (IV SOLUTION) ×1 IMPLANT
MANIFOLD NEPTUNE II (INSTRUMENTS) ×3 IMPLANT
NEEDLE HYPO 22GX1.5 SAFETY (NEEDLE) ×2 IMPLANT
NS IRRIG 500ML POUR BTL (IV SOLUTION) ×2 IMPLANT
PACK LAP CHOLECYSTECTOMY (MISCELLANEOUS) ×2 IMPLANT
RELOAD 45 VASCULAR/THIN (ENDOMECHANICALS) ×2 IMPLANT
RELOAD STAPLE 45 2.5 WHT GRN (ENDOMECHANICALS) ×1 IMPLANT
RELOAD STAPLE 45 3.5 BLU ETS (ENDOMECHANICALS) ×1 IMPLANT
RELOAD STAPLE TA45 3.5 REG BLU (ENDOMECHANICALS) ×2 IMPLANT
SCISSORS METZENBAUM CVD 33 (INSTRUMENTS) IMPLANT
SHEARS HARMONIC ACE PLUS 36CM (ENDOMECHANICALS) ×2 IMPLANT
SLEEVE ENDOPATH XCEL 5M (ENDOMECHANICALS) ×3 IMPLANT
SPONGE T-LAP 18X18 ~~LOC~~+RFID (SPONGE) ×2 IMPLANT
SUT MNCRL AB 4-0 PS2 18 (SUTURE) ×2 IMPLANT
SUT VICRYL 0 AB UR-6 (SUTURE) ×4 IMPLANT
SYR 20ML LL LF (SYRINGE) ×2 IMPLANT
SYS BAG RETRIEVAL 10MM (BASKET) ×2
SYSTEM BAG RETRIEVAL 10MM (BASKET) ×1 IMPLANT
TRAY FOLEY MTR SLVR 16FR STAT (SET/KITS/TRAYS/PACK) ×2 IMPLANT
TROCAR XCEL BLUNT TIP 100MML (ENDOMECHANICALS) ×2 IMPLANT
TROCAR XCEL NON-BLD 5MMX100MML (ENDOMECHANICALS) ×2 IMPLANT
TUBING EVAC SMOKE HEATED PNEUM (TUBING) ×2 IMPLANT
WATER STERILE IRR 500ML POUR (IV SOLUTION) ×1 IMPLANT

## 2022-07-09 NOTE — H&P (Signed)
Briana Klein is an 51 y.o. female.        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  Pathology shows some chronic changes no evidence of dysplasia. She does have some functional and chronic abdominal pain that is intermittent and does have diarrhea as well.   She went to [place] and acquired COVID for that reason we had to postpone the surgery.  Most recently she went to [facility] for further evaluation of hematuria.  Most recent CT scan showed no major changes within the appendix Also CT scan showed left nephrolithiasis without evidence of hydronephrosis  SHe is here for appendectomy           Past Medical History:  Diagnosis Date   Abnormal stress test      Dr. Mancel Parsons   Anxiety     Asthma      inhaler   Chronic diarrhea      history   Hyperlipidemia     Hypertension     Migraines      otc med prn   Seasonal allergies     SVD (spontaneous vaginal delivery)      x 3   Vitamin D deficiency             Past Surgical History:  Procedure Laterality Date   COLONOSCOPY N/A 03/30/2022    Procedure: COLONOSCOPY;  Surgeon: Lucilla Lame, MD;  Location: [facility];  Service: Endoscopy;  Laterality: N/A;   HYSTEROSCOPY WITH NOVASURE N/A 12/12/2013    Procedure: HYSTEROSCOPY WITH NOVASURE;  Surgeon: Logan Bores, MD;  Location: [facility];  Service: Gynecology;  Laterality: N/A;           Family History  Problem Relation Age of Onset   Hypertension Mother     Heart disease Father     Diabetes Father     Stroke Father     Heart disease Paternal Grandfather     Breast cancer Maternal Aunt     Colon cancer Maternal Aunt 80   Breast cancer Paternal Grandmother     Diabetes Other     Melanoma Neg Hx         Social History:  reports that she has never smoked. She has never used smokeless tobacco. She reports current alcohol use. She reports that she does not use drugs.   Allergies:       Allergies  Allergen Reactions   Amoxicillin-Pot Clavulanate        Passed out, has taken penicillin and amoxicillin w/no problems      Medications reviewed.       ROS Full ROS performed and is otherwise negative other than what is stated in HPI      Physical Exam Vitals and nursing note reviewed. Exam conducted with a chaperone present.  Constitutional:      General: She is not in acute distress.    Appearance: Normal appearance. She is normal weight.  Eyes:     General: No scleral icterus.       Right eye: No discharge.        Left eye: No discharge.  Cardiovascular:     Rate and  Rhythm: Normal rate and regular rhythm.     Heart sounds: No murmur heard. Pulmonary:     Effort: Pulmonary effort is normal. No respiratory distress.     Breath sounds: Normal breath sounds. No stridor. No wheezing or rhonchi.  Abdominal:     General: Abdomen is flat. There is no distension.     Palpations: Abdomen is soft. There is no mass.     Tenderness: There is no abdominal tenderness. There is no guarding or rebound.     Hernia: No hernia is present.  Musculoskeletal:        General: No swelling, tenderness, deformity or signs of injury. Normal range of motion.     Cervical back: Normal range of motion and neck supple. No rigidity or tenderness.  Lymphadenopathy:     Cervical: No cervical adenopathy.  Skin:    General: Skin is warm and dry.     Capillary Refill: Capillary refill takes less than 2 seconds.  Neurological:     General: No focal deficit present.     Mental Status: She is alert and oriented to person, place, and time.  Psychiatric:        Mood and Affect: Mood normal.        Behavior: Behavior normal.        Thought Content: Thought content normal.        Judgment: Judgment  normal.        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  CT scan was recently did not show that as clearly.  I had an extensive discussion with her regarding her findings.  I do think that because there was evidence of polyp on endoscopy as well as potential mucocele on CT scan I think the safest thing to do is to proceed with laparoscopic appendectomy.  I have seen mucoceles and other neoplasm of the appendix countless times.  She is healthy and she will be a good operative candidate.  Without hesitation I recommend proceeding with appendectomy.    Caroleen Hamman, MD [facility] General Surgeon

## 2022-07-09 NOTE — Anesthesia Preprocedure Evaluation (Signed)
Anesthesia Evaluation  Patient identified by MRN, date of birth, ID band Patient awake    Reviewed: Allergy & Precautions, NPO status , Patient's Chart, lab work & pertinent test results  Airway Mallampati: III  TM Distance: >3 FB Neck ROM: full    Dental  (+) Chipped, Teeth Intact   Pulmonary neg shortness of breath, asthma , neg sleep apnea, neg COPD, neg recent URI,    Pulmonary exam normal        Cardiovascular hypertension, (-) angina(-) CAD, (-) Past MI and (-) CABG + dysrhythmias Atrial Fibrillation      Neuro/Psych PSYCHIATRIC DISORDERS negative neurological ROS     GI/Hepatic negative GI ROS, Neg liver ROS,   Endo/Other  negative endocrine ROS  Renal/GU      Musculoskeletal   Abdominal   Peds  Hematology negative hematology ROS (+)   Anesthesia Other Findings Past Medical History: No date: Abnormal stress test     Comment:  Dr. Mancel Parsons No date: Anxiety No date: Asthma     Comment:  inhaler No date: Chronic diarrhea     Comment:  history No date: Dysrhythmia No date: History of kidney stones No date: Hyperlipidemia No date: Hypertension No date: Migraines     Comment:  otc med prn No date: Seasonal allergies No date: SVD (spontaneous vaginal delivery)     Comment:  x 3 No date: Vitamin D deficiency  Past Surgical History: 03/30/2022: COLONOSCOPY; N/A     Comment:  Procedure: COLONOSCOPY;  Surgeon: Lucilla Lame, MD;                Location: [facility];  Service: Endoscopy;                Laterality: N/A; 12/12/2013: HYSTEROSCOPY WITH NOVASURE; N/A     Comment:  Procedure: HYSTEROSCOPY WITH NOVASURE;  Surgeon: Logan Bores, MD;  Location: [facility];  Service: Gynecology;               Laterality: N/A;  BMI    Body Mass Index: 25.44 kg/m      Reproductive/Obstetrics negative OB ROS                             Anesthesia  Physical Anesthesia Plan  ASA: 2  Anesthesia Plan: General ETT   Post-op Pain Management:    Induction: Intravenous  PONV Risk Score and Plan: 4 or greater and Ondansetron, Dexamethasone and Midazolam  Airway Management Planned: Oral ETT  Additional Equipment:   Intra-op Plan:   Post-operative Plan: Extubation in OR  Informed Consent: I have reviewed the patients History and Physical, chart, labs and discussed the procedure including the risks, benefits and alternatives for the proposed anesthesia with the patient or authorized representative who has indicated his/her understanding and acceptance.     Dental Advisory Given  Plan Discussed with: Anesthesiologist, CRNA and Surgeon  Anesthesia Plan Comments: (Patient consented for risks of anesthesia including but not limited to:  - adverse reactions to medications - damage to eyes, teeth, lips or other oral mucosa - nerve damage due to positioning  - sore throat or hoarseness - Damage to heart, brain, nerves, lungs, other parts of body or loss of life  Patient voiced understanding.)        Anesthesia Quick Evaluation

## 2022-07-09 NOTE — Op Note (Signed)
laparascopic partial cecectomy  Briana Klein Date of operation:  07/09/2022  Indications: The patient presented with a history of  appendiceal mass  Pre-operative Diagnosis: Appendiceal mass  Post-operative Diagnosis: Same  Surgeon: Caroleen Hamman, MD, FACS  Anesthesia: General with endotracheal tube  Findings: bilobulate appendical mass about 1 cm from the tip. No evidence of peritoneal seeding or metastatic disease  Estimated Blood Loss: 5         Specimens: appendix and cecum         Complications:  none  Procedure Details  The patient was seen again in the preop area. The options of surgery versus observation were reviewed with the patient and/or family. The risks of bleeding, infection, recurrence of symptoms, negative laparoscopy, potential for an open procedure, bowel injury, abscess or infection, were all reviewed as well. The patient was taken to Operating Room, identified as Briana Klein and the procedure verified as laparoscopic appendectomy. A Time Out was held and the above information confirmed.  The patient was placed in the supine position and general anesthesia was induced.  Antibiotic prophylaxis was administered and VT E prophylaxis was in place.   The abdomen was prepped and draped in a sterile fashion. An infraumbilical incision was made. A cutdown technique was used to enter the abdominal cavity. Two vicryl stitches were placed on the fascia and a Hasson trocar inserted. Pneumoperitoneum obtained. Two 5 mm ports were placed under direct visualization.   The appendix was identified and found to ave a lobulated lesion about 1 cms from its tip.The appendix was carefully dissected. The mesoappendix was divided withHarmonic scalpel. The base of the appendix was dissected. Given potential for malignancy I made sure I divided the cecum , performing a partial cecectomy with good generous margins from the base of the appendix. with multiple standard loads Endo GIA.The  appendix and cecum was placed in a Endo Catch bag and removed via the Hasson port. The right lower quadrant and pelvis was then irrigated with  normal saline which was aspirated. Inspection  failed to identify any additional bleeding and there were no signs of bowel injury. Again the right lower quadrant was inspected there was no sign of bleeding or bowel injury therefore pneumoperitoneum was released, all ports were removed.  The umbilical fascia was closed with 0 Vicryl interrupted sutures and the skin incisions were approximated with subcuticular 4-0 Monocryl. Dermabond was placed The patient tolerated the procedure well, there were no complications. The sponge lap and needle count were correct at the end of the procedure.  The patient was taken to the recovery room in stable condition to be admitted for continued care.    Caroleen Hamman, MD FACS

## 2022-07-09 NOTE — Discharge Instructions (Addendum)
Laparoscopic Appendectomy, Adult, Care After What can I expect after the procedure? After a laparoscopic appendectomy, it is common to have: Little energy for normal activities. Mild pain in the area where the cuts from surgery (incisions) were made. Trouble pooping (constipation). This may be caused by: Pain medicine. A lack of activity. Gas pain that can spread to your shoulders. Follow these instructions at home: Your doctor may give you more instructions. If you have problems, contact your doctor. Medicines Take over-the-counter and prescription medicines only as told by your doctor. Do not stop taking antibiotics even if you start to feel better. Take pain medicines before your pain gets very bad. If told, take steps to prevent problems with pooping (constipation). You may need to: Drink enough fluid to keep your pee (urine) pale yellow. Take medicines. You will be told what medicines to take. Eat foods that are high in fiber. These include beans, whole grains, and fresh fruits and vegetables. Limit foods that are high in fat and sugar. These include fried or sweet foods. Ask your doctor if you should avoid driving or using machines while you are taking your medicine. Incision care Two stitched incisions. One is normal. The other is red with pus and infected.   Follow instructions from your doctor about how to take care of your incisions. Make sure you: Wash your hands with soap and water for at least 20 seconds before and after you change your bandage. If you cannot use soap and water, use hand sanitizer. Change your bandage. Leave stitches, staples, or skin glue in place for at least 2 weeks. Leave tape strips alone unless you are told to take them off. You may trim the edges of the tape strips if they curl up. Check your incisions every day for signs of infection. Check for: Redness, swelling, or pain. Fluid or blood. Warmth. Pus or a bad smell. Bathing Do not take baths,  swim, or use a hot tub. Ask your doctor about taking showers or sponge baths. Keep your incisions clean and dry. Clean them as told by your doctor. To do this: Gently wash the incisions with soap and water. Rinse the incisions with water to get all the soap off. Pat the incisions dry with a clean towel. Do not rub the incisions. Activity A sign showing that a person should not lift anything heavy.   If you were given a sedative during your procedure, do not drive or use machines until your doctor says that it is safe. A sedative is a medicine that helps you relax. Rest as told by your doctor. Do not lift anything that is heavier than 10 lb (4.5 kg). Do not play contact sports until your doctor says that it is safe. Return to your normal activities when your doctor says that it is safe. General instructions If you were sent home with a drain, follow instructions from your doctor on how to care for it. Take deep breaths. This helps to keep your lungs from getting an infection (pneumonia). If you need to cough or sneeze, place a pillow or blanket on your belly before you do so. This will help you control the pain. Keep all follow-up visits. Contact a doctor if: You have any of these signs of infection in an incision: Redness, swelling, or pain. Fluid or blood. Warmth. Pus or a bad smell. Your incisions open after the stitches have been taken out. You lose your appetite. You feel as if you may vomit. You vomit. You  get watery poop (diarrhea), or you cannot control your poop. You get a rash. Get help right away if: You have a fever. You have trouble breathing. You have sharp pains in your chest. You have swelling or pain in your legs. You faint. These symptoms may be an emergency. Get help right away. Call 911. Do not wait to see if the symptoms will go away. Do not drive yourself to the hospital. Summary After the procedure, it is common to have low energy, mild pain, trouble  pooping, and gas pain. Follow your doctor's instructions about caring for yourself after the procedure. Rest after the procedure. Return to your normal activities as told by your doctor. Contact your doctor if you see signs of infection around your incisions. Get help right away if you have a fever, chest pain, or trouble breathing. This information is not intended to replace advice given to you by your health care provider. Make sure you discuss any questions you have with your health care provider. Document Revised: 09/11/2021 Document Reviewed: 09/11/2021 Elsevier Patient Education  [place]   The drugs that you were given will stay in your system until tomorrow so for the next 24 hours you should not:  Drive an automobile Make any legal decisions Drink any alcoholic beverage   You may resume regular meals tomorrow.  Today it is better to start with liquids and gradually work up to solid foods.  You may eat anything you prefer, but it is better to start with liquids, then soup and crackers, and gradually work up to solid foods.   Please notify your doctor immediately if you have any unusual bleeding, trouble breathing, redness and pain at the surgery site, drainage, fever, or pain not relieved by medication.    Your post-operative visit with Dr.                                       is: Date:                        Time:    Please call to schedule your post-operative visit.  Additional Instructions:

## 2022-07-09 NOTE — Anesthesia Postprocedure Evaluation (Signed)
Anesthesia Post Note  Patient: Briana Klein  Procedure(s) Performed: APPENDECTOMY LAPAROSCOPIC  Patient location during evaluation: PACU Anesthesia Type: General Level of consciousness: awake and alert Pain management: pain level controlled Vital Signs Assessment: post-procedure vital signs reviewed and stable Respiratory status: spontaneous breathing, nonlabored ventilation, respiratory function stable and patient connected to nasal cannula oxygen Cardiovascular status: blood pressure returned to baseline and stable Postop Assessment: no apparent nausea or vomiting Anesthetic complications: no   No notable events documented.   Last Vitals:  Vitals:   07/09/22 1200 07/09/22 1222  BP: 130/87 (!) 137/93  Pulse: 71 (!) 57  Resp: (!) 21 16  Temp: (!) 36.3 C (!) 36.1 C  SpO2: 98% 100%    Last Pain:  Vitals:   07/09/22 1222  TempSrc: Temporal  PainSc: [place]

## 2022-07-09 NOTE — Transfer of Care (Signed)
Immediate Anesthesia Transfer of Care Note  Patient: Briana Klein  Procedure(s) Performed: APPENDECTOMY LAPAROSCOPIC  Patient Location: PACU  Anesthesia Type:General  Level of Consciousness: awake  Airway & Oxygen Therapy: Patient Spontanous Breathing and Patient connected to face mask oxygen  Post-op Assessment: Report given to RN and Post -op Vital signs reviewed and stable  Post vital signs: Reviewed and stable  Last Vitals:  Vitals Value Taken Time  BP 134/93 07/09/22 1120  Temp 36.3 C 07/09/22 1118  Pulse 78 07/09/22 1120  Resp 12 07/09/22 1120  SpO2 100 % 07/09/22 1120  Vitals shown include unvalidated device data.  Last Pain:  Vitals:   07/09/22 1118  TempSrc:   PainSc: Asleep         Complications: No notable events documented.

## 2022-07-09 NOTE — Anesthesia Procedure Notes (Signed)
Procedure Name: Intubation Date/Time: 07/09/2022 10:20 AM  Performed by: Loletha Grayer, CRNAPre-anesthesia Checklist: Patient identified, Patient being monitored, Timeout performed, Emergency Drugs available and Suction available Patient Re-evaluated:Patient Re-evaluated prior to induction Oxygen Delivery Method: Circle system utilized Preoxygenation: Pre-oxygenation with 100% oxygen Induction Type: IV induction Ventilation: Mask ventilation without difficulty Laryngoscope Size: 3 and Miller Grade View: Grade I Tube type: Oral Tube size: 7.0 mm Number of attempts: 1 Airway Equipment and Method: Stylet Placement Confirmation: ETT inserted through vocal cords under direct vision, positive ETCO2 and breath sounds checked- equal and bilateral Secured at: 21 cm Tube secured with: Tape Dental Injury: Teeth and Oropharynx as per pre-operative assessment

## 2022-07-10 ENCOUNTER — Encounter: Payer: Self-pay | Admitting: Surgery

## 2022-07-10 LAB — SURGICAL PATHOLOGY

## 2022-07-22 ENCOUNTER — Encounter: Payer: Self-pay | Admitting: Surgery

## 2022-07-22 ENCOUNTER — Ambulatory Visit (INDEPENDENT_AMBULATORY_CARE_PROVIDER_SITE_OTHER): Payer: Managed Care, Other (non HMO) | Admitting: Surgery

## 2022-07-22 ENCOUNTER — Other Ambulatory Visit: Payer: Self-pay

## 2022-07-22 VITALS — BP 139/89 | HR 61 | Temp 97.8°F | Ht 66.5 in | Wt 159.8 lb

## 2022-07-22 DIAGNOSIS — Z09 Encounter for follow-up examination after completed treatment for conditions other than malignant neoplasm: Secondary | ICD-10-CM

## 2022-07-22 DIAGNOSIS — K388 Other specified diseases of appendix: Secondary | ICD-10-CM

## 2022-07-22 NOTE — Progress Notes (Signed)
S/p partail cecectomy , path d/w pt in detail. Mucocele, negative margins She is doing well, no fevers, taking po  PE NAD, alert Abd: soft, nt, incisions c/d/I  A/P Doing well RTC prn

## 2022-07-22 NOTE — Patient Instructions (Signed)

## 2022-08-18 ENCOUNTER — Other Ambulatory Visit: Payer: Self-pay | Admitting: *Deleted

## 2022-08-18 DIAGNOSIS — N200 Calculus of kidney: Secondary | ICD-10-CM

## 2022-08-18 DIAGNOSIS — R3129 Other microscopic hematuria: Secondary | ICD-10-CM

## 2022-08-22 ENCOUNTER — Other Ambulatory Visit: Payer: Self-pay | Admitting: Family

## 2022-08-22 DIAGNOSIS — G4700 Insomnia, unspecified: Secondary | ICD-10-CM

## 2022-08-23 ENCOUNTER — Other Ambulatory Visit: Payer: Self-pay | Admitting: Family

## 2022-08-23 DIAGNOSIS — I10 Essential (primary) hypertension: Secondary | ICD-10-CM

## 2022-09-15 ENCOUNTER — Ambulatory Visit: Payer: Managed Care, Other (non HMO) | Admitting: Dermatology

## 2022-09-15 DIAGNOSIS — D1801 Hemangioma of skin and subcutaneous tissue: Secondary | ICD-10-CM | POA: Diagnosis not present

## 2022-09-15 DIAGNOSIS — L814 Other melanin hyperpigmentation: Secondary | ICD-10-CM

## 2022-09-15 DIAGNOSIS — L578 Other skin changes due to chronic exposure to nonionizing radiation: Secondary | ICD-10-CM

## 2022-09-15 DIAGNOSIS — D2272 Melanocytic nevi of left lower limb, including hip: Secondary | ICD-10-CM

## 2022-09-15 DIAGNOSIS — D225 Melanocytic nevi of trunk: Secondary | ICD-10-CM | POA: Diagnosis not present

## 2022-09-15 DIAGNOSIS — D229 Melanocytic nevi, unspecified: Secondary | ICD-10-CM

## 2022-09-15 DIAGNOSIS — L821 Other seborrheic keratosis: Secondary | ICD-10-CM

## 2022-09-15 DIAGNOSIS — D2271 Melanocytic nevi of right lower limb, including hip: Secondary | ICD-10-CM | POA: Diagnosis not present

## 2022-09-15 NOTE — Patient Instructions (Signed)
     Melanoma ABCDEs  Melanoma is the most dangerous type of skin cancer, and is the leading cause of death from skin disease.  You are more likely to develop melanoma if you: Have light-colored skin, light-colored eyes, or red or blond hair Spend a lot of time in the sun Tan regularly, either outdoors or in a tanning bed Have had blistering sunburns, especially during childhood Have a close family member who has had a melanoma Have atypical moles or large birthmarks  Early detection of melanoma is key since treatment is typically straightforward and cure rates are extremely high if we catch it early.   The first sign of melanoma is often a change in a mole or a new dark spot.  The ABCDE system is a way of remembering the signs of melanoma.  A for asymmetry:  The two halves do not match. B for border:  The edges of the growth are irregular. C for color:  A mixture of colors are present instead of an even brown color. D for diameter:  Melanomas are usually (but not always) greater than 6mm - the size of a pencil eraser. E for evolution:  The spot keeps changing in size, shape, and color.  Please check your skin once per month between visits. You can use a small mirror in front and a large mirror behind you to keep an eye on the back side or your body.   If you see any new or changing lesions before your next follow-up, please call to schedule a visit.  Please continue daily skin protection including broad spectrum sunscreen SPF 30+ to sun-exposed areas, reapplying every 2 hours as needed when you're outdoors.   Staying in the shade or wearing long sleeves, sun glasses (UVA+UVB protection) and wide brim hats (4-inch brim around the entire circumference of the hat) are also recommended for sun protection.    Due to recent changes in healthcare laws, you may see results of your pathology and/or laboratory studies on MyChart before the doctors have had a chance to review them. We  understand that in some cases there may be results that are confusing or concerning to you. Please understand that not all results are received at the same time and often the doctors may need to interpret multiple results in order to provide you with the best plan of care or course of treatment. Therefore, we ask that you please give us 2 business days to thoroughly review all your results before contacting the office for clarification. Should we see a critical lab result, you will be contacted sooner.   If You Need Anything After Your Visit  If you have any questions or concerns for your doctor, please call our main line at [phone] and press option 4 to reach your doctor's medical assistant. If no one answers, please leave a voicemail as directed and we will return your call as soon as possible. Messages left after 4 pm will be answered the following business day.   You may also send us a message via MyChart. We typically respond to MyChart messages within 1-2 business days.  For prescription refills, please ask your pharmacy to contact our office. Our fax number is [phone].  If you have an urgent issue when the clinic is closed that cannot wait until the next business day, you can page your doctor at the number below.    Please note that while we do our best to be available for urgent issues   outside of office hours, we are not available 24/7.   If you have an urgent issue and are unable to reach us, you may choose to seek medical care at your doctor's office, retail clinic, urgent care center, or emergency room.  If you have a medical emergency, please immediately call 911 or go to the emergency department.  Pager Numbers  - Dr. Kowalski: [phone]  - Dr. Moye: [phone]  - Dr. Stewart: [phone]  In the event of inclement weather, please call our main line at [phone] for an update on the status of any delays or closures.  Dermatology Medication Tips: Please  keep the boxes that topical medications come in in order to help keep track of the instructions about where and how to use these. Pharmacies typically print the medication instructions only on the boxes and not directly on the medication tubes.   If your medication is too expensive, please contact our office at [phone] option 4 or send us a message through MyChart.   We are unable to tell what your co-pay for medications will be in advance as this is different depending on your insurance coverage. However, we may be able to find a substitute medication at lower cost or fill out paperwork to get insurance to cover a needed medication.   If a prior authorization is required to get your medication covered by your insurance company, please allow us 1-2 business days to complete this process.  Drug prices often vary depending on where the prescription is filled and some pharmacies may offer cheaper prices.  The website www.goodrx.com contains coupons for medications through different pharmacies. The prices here do not account for what the cost may be with help from insurance (it may be cheaper with your insurance), but the website can give you the price if you did not use any insurance.  - You can print the associated coupon and take it with your prescription to the pharmacy.  - You may also stop by our office during regular business hours and pick up a GoodRx coupon card.  - If you need your prescription sent electronically to a different pharmacy, notify our office through [facility] MyChart or by phone at [phone] option 4.     Si Usted Necesita Algo Despus de Su Visita  Tambin puede enviarnos un mensaje a travs de MyChart. Por lo general respondemos a los mensajes de MyChart en el transcurso de 1 a 2 das hbiles.  Para renovar recetas, por favor pida a su farmacia que se ponga en contacto con nuestra oficina. Nuestro nmero de fax es el [phone].  Si tiene un asunto urgente  cuando la clnica est cerrada y que no puede esperar hasta el siguiente da hbil, puede llamar/localizar a su doctor(a) al nmero que aparece a continuacin.   Por favor, tenga en cuenta que aunque hacemos todo lo posible para estar disponibles para asuntos urgentes fuera del horario de oficina, no estamos disponibles las 24 horas del da, los 7 das de la semana.   Si tiene un problema urgente y no puede comunicarse con nosotros, puede optar por buscar atencin mdica  en el consultorio de su doctor(a), en una clnica privada, en un centro de atencin urgente o en una sala de emergencias.  Si tiene una emergencia mdica, por favor llame inmediatamente al 911 o vaya a la sala de emergencias.  Nmeros de bper  - Dr. Kowalski: [phone]  - Dra. Moye: [phone]  - Dra. Stewart: [phone]  En caso   de inclemencias del tiempo, por favor llame a nuestra lnea principal al [phone] para una actualizacin sobre el estado de cualquier retraso o cierre.  Consejos para la medicacin en dermatologa: Por favor, guarde las cajas en las que vienen los medicamentos de uso tpico para ayudarle a seguir las instrucciones sobre dnde y cmo usarlos. Las farmacias generalmente imprimen las instrucciones del medicamento slo en las cajas y no directamente en los tubos del medicamento.   Si su medicamento es muy caro, por favor, pngase en contacto con nuestra oficina llamando al [phone] y presione la opcin 4 o envenos un mensaje a travs de MyChart.   No podemos decirle cul ser su copago por los medicamentos por adelantado ya que esto es diferente dependiendo de la cobertura de su seguro. Sin embargo, es posible que podamos encontrar un medicamento sustituto a menor costo o llenar un formulario para que el seguro cubra el medicamento que se considera necesario.   Si se requiere una autorizacin previa para que su compaa de seguros cubra su medicamento, por favor permtanos de 1 a 2  das hbiles para completar este proceso.  Los precios de los medicamentos varan con frecuencia dependiendo del lugar de dnde se surte la receta y alguna farmacias pueden ofrecer precios ms baratos.  El sitio web www.goodrx.com tiene cupones para medicamentos de diferentes farmacias. Los precios aqu no tienen en cuenta lo que podra costar con la ayuda del seguro (puede ser ms barato con su seguro), pero el sitio web puede darle el precio si no utiliz ningn seguro.  - Puede imprimir el cupn correspondiente y llevarlo con su receta a la farmacia.  - Tambin puede pasar por nuestra oficina durante el horario de atencin regular y recoger una tarjeta de cupones de GoodRx.  - Si necesita que su receta se enve electrnicamente a una farmacia diferente, informe a nuestra oficina a travs de MyChart de [facility] o por telfono llamando al [phone] y presione la opcin 4.  

## 2022-09-15 NOTE — Progress Notes (Signed)
   Follow-Up Visit   Subjective  Briana Klein is a 51 y.o. female who presents for the following: Annual Exam.  The patient presents for Total-Body Skin Exam (TBSE) for skin cancer screening and mole check.  The patient has spots, moles and lesions to be evaluated, some may be new or changing. No family history of melanoma or skin cancer.    The following portions of the chart were reviewed this encounter and updated as appropriate:       Review of Systems:  No other skin or systemic complaints except as noted in HPI or Assessment and Plan.  Objective  Well appearing patient in no apparent distress; mood and affect are within normal limits.  A full examination was performed including scalp, head, eyes, ears, nose, lips, neck, chest, axillae, abdomen, back, buttocks, bilateral upper extremities, bilateral lower extremities, hands, feet, fingers, toes, fingernails, and toenails. All findings within normal limits unless otherwise noted below.  Left Flank; right mid abdomen; right lateral calf; left medial ankle; right posterior flank; right anterior thigh; right lateral knee Left Flank 6 x 5 mm speckled brown macule    right mid abdomen 1.3 x 0.8 cm reg brown patch    right calf 4 mm med dark brown macule    left medial ankle 6 mm med dark brown macule    right posterior flank 8 x 5 mm brown macule with darker edge  right lateral upper knee/ant thigh 74m med-dark brown macule       Assessment & Plan  Nevus Left Flank; right mid abdomen; right lateral calf; left medial ankle; right posterior flank; right anterior thigh; right lateral knee  Benign-appearing, stable compared to previous photos 09/04/20 and 09/09/21. Observation.  Call clinic for new or changing moles.  Recommend daily use of broad spectrum spf 30+ sunscreen to sun-exposed areas.    Hemangiomas - Red papules - Discussed benign nature - Observe - Call for any changes   Lentigines - Scattered tan  macules - Due to sun exposure - Benign-appearing, observe - Recommend daily broad spectrum sunscreen SPF 30+ to sun-exposed areas, reapply every 2 hours as needed. - Call for any changes   Seborrheic Keratoses - Stuck-on, waxy, tan-brown papules and/or plaques  - Benign-appearing - Discussed benign etiology and prognosis. - Observe - Call for any changes  Melanocytic Nevi - Tan-brown and/or pink-flesh-colored symmetric macules and papules - Benign appearing on exam today - Observation - Call clinic for new or changing moles - Recommend daily use of broad spectrum spf 30+ sunscreen to sun-exposed areas.    Actinic Damage - chronic, secondary to cumulative UV radiation exposure/sun exposure over time - diffuse erythematous macules with underlying dyspigmentation - Recommend daily broad spectrum sunscreen SPF 30+ to sun-exposed areas, reapply every 2 hours as needed.  - Recommend staying in the shade or wearing long sleeves, sun glasses (UVA+UVB protection) and wide brim hats (4-inch brim around the entire circumference of the hat). - Call for new or changing lesions.   Return in about 1 year (around 09/16/2023) for TBSE.  Documentation: I have reviewed the above documentation for accuracy and completeness, and I agree with the above.  TBrendolyn PattyMD

## 2022-09-17 ENCOUNTER — Other Ambulatory Visit: Payer: Self-pay | Admitting: Urology

## 2022-09-17 ENCOUNTER — Ambulatory Visit
Admission: RE | Admit: 2022-09-17 | Discharge: 2022-09-17 | Disposition: A | Discharge status: Home / Self Care | Payer: Managed Care, Other (non HMO) | Attending: Urology | Admitting: Urology

## 2022-09-17 ENCOUNTER — Ambulatory Visit: Payer: Managed Care, Other (non HMO) | Admitting: Urology

## 2022-09-17 ENCOUNTER — Ambulatory Visit
Admission: RE | Admit: 2022-09-17 | Discharge: 2022-09-17 | Disposition: A | Discharge status: Home / Self Care | Payer: Managed Care, Other (non HMO) | Source: Ambulatory Visit | Attending: Urology | Admitting: Urology

## 2022-09-17 ENCOUNTER — Encounter: Payer: Self-pay | Admitting: Urology

## 2022-09-17 VITALS — BP 152/83 | HR 73 | Ht 66.0 in | Wt 160.0 lb

## 2022-09-17 DIAGNOSIS — N201 Calculus of ureter: Secondary | ICD-10-CM

## 2022-09-17 DIAGNOSIS — N200 Calculus of kidney: Secondary | ICD-10-CM | POA: Diagnosis not present

## 2022-09-17 LAB — URINALYSIS, COMPLETE
Bilirubin, UA: NEGATIVE
Glucose, UA: NEGATIVE
Ketones, UA: NEGATIVE
Leukocytes,UA: NEGATIVE
Nitrite, UA: NEGATIVE
Specific Gravity, UA: 1.03 (ref 1.005–1.030)
Urobilinogen, Ur: 0.2 mg/dL (ref 0.2–1.0)
pH, UA: 5 (ref 5.0–7.5)

## 2022-09-17 LAB — MICROSCOPIC EXAMINATION

## 2022-09-17 NOTE — Progress Notes (Signed)
Surgical Physician Order Form [facility] [place]  * Scheduling expectation : Next Available  *Length of Case: 75 minutes  *Clearance needed: no  *Anticoagulation Instructions: N/A  *Aspirin Instructions: Ok to continue Aspirin  *Post-op visit Date/Instructions:  1 week cysto stent removal  *Diagnosis: Left Ureteral Stone  *Procedure: Left Ureteroscopy w/laser lithotripsy & stent placement (14970)   Additional orders: N/A  -Admit type: OUTpatient  -Anesthesia: General  -VTE Prophylaxis Standing Order SCD's       Other:   -Standing Lab Orders Per Anesthesia    Lab other:  Urine pregnancy, urine culture ordered  -Standing Test orders EKG/Chest x-ray per Anesthesia       Test other:   - Medications:  Ancef 2gm IV  -Other orders:  N/A

## 2022-09-17 NOTE — H&P (View-Only) (Signed)
09/17/2022 3:02 PM   Kelsha D Mills 1970/12/24 [id number]  Referring provider: Burnard Hawthorne, FNP [address] [place],  [place] [address]  Chief Complaint  Patient presents with   Nephrolithiasis    HPI: 51 y.o. female presents for follow-up of nephrolithiasis.  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  Presently notes occasional left flank twinges and occasional burning sensation when voiding Denies fever, chills or gross hematuria   PMH: Past Medical History:  Diagnosis Date   Abnormal stress test    Dr. Mancel Parsons   Anxiety    Asthma    inhaler   Chronic diarrhea    history   Dysrhythmia    History of kidney stones    Hyperlipidemia    Hypertension    Migraines    otc med prn   Seasonal allergies    SVD (spontaneous vaginal delivery)    x 3   Vitamin D deficiency     Surgical History: Past Surgical History:  Procedure Laterality Date   COLONOSCOPY N/A 03/30/2022   Procedure: COLONOSCOPY;  Surgeon: Lucilla Lame, MD;  Location: [facility];  Service: Endoscopy;  Laterality: N/A;   HYSTEROSCOPY WITH NOVASURE N/A 12/12/2013   Procedure: HYSTEROSCOPY WITH NOVASURE;  Surgeon: Logan Bores, MD;  Location: [facility];  Service: Gynecology;  Laterality: N/A;   LAPAROSCOPIC APPENDECTOMY N/A 07/09/2022   Procedure: APPENDECTOMY LAPAROSCOPIC;  Surgeon: Jules Husbands, MD;  Location: [facility];  Service: General;  Laterality: N/A;  Provider is requesting 1 hour(60 minutes) for this case    Home Medications:  Allergies as of 09/17/2022       Reactions   Amoxicillin-pot Clavulanate    Passed out, has taken  penicillin and amoxicillin w/no problems        Medication List        Accurate as of September 17, 2022  3:02 PM. If you have any questions, ask your nurse or doctor.          Advair HFA 115-21 MCG/ACT inhaler Generic drug: fluticasone-salmeterol INHALE 2 PUFFS INTO THE LUNGS TWICE DAILY AS NEEDED   albuterol 108 (90 Base) MCG/ACT inhaler Commonly known as: Ventolin HFA INHALE 2 PUFFS BY MOUTH INTO THE LUNGS EVERY 6 HOURS AS NEEDED   ALPRAZolam 0.25 MG tablet Commonly known as: XANAX Take 1 tablet (0.25 mg total) by mouth daily as needed for anxiety.   amLODipine 5 MG tablet Commonly known as: NORVASC TAKE 1 TABLET(5 MG) BY MOUTH DAILY   aspirin-acetaminophen-caffeine 250-250-65 MG tablet Commonly known as: EXCEDRIN MIGRAINE Take by mouth every 6 (six) hours as needed for headache.   fexofenadine 180 MG tablet Commonly known as: ALLEGRA Take 180 mg by mouth as needed for allergies or rhinitis.   traZODone 50 MG tablet Commonly known as: DESYREL TAKE 1 TABLET(50 MG) BY MOUTH AT BEDTIME AS NEEDED FOR SLEEP        Allergies:  Allergies  Allergen Reactions   Amoxicillin-Pot Clavulanate     Passed out, has taken penicillin and amoxicillin w/no problems    Family History: Family History  Problem Relation Age of Onset   Hypertension Mother    Heart  disease Father    Diabetes Father    Stroke Father    Heart disease Paternal Grandfather    Breast cancer Maternal Aunt    Colon cancer Maternal Aunt 59   Breast cancer Paternal Grandmother    Diabetes Other    Melanoma Neg Hx     Social History:  reports that she has never smoked. She has never used smokeless tobacco. She reports current alcohol use of about 1.0 standard drink of alcohol per week. She reports that she does not use drugs.   Physical Exam: BP (!) 152/83   Pulse 73   Ht '5\' 6"'$  (1.676 m)   Wt 160 lb (72.6 kg)   BMI 25.82 kg/m   Constitutional:  Alert and oriented, No acute distress. HEENT:  [place] AT, moist mucus membranes.  Trachea midline, no masses. Cardiovascular: No clubbing, cyanosis, or edema. Respiratory: Normal respiratory effort, no increased work of breathing. Neurologic: Grossly intact, no focal deficits, moving all 4 extremities. Psychiatric: Normal mood and affect.  Laboratory Data:  Urinalysis Dipstick 2+ blood/1+ protein Microscopy 6-10 WBC/11-30 RBC   Pertinent Imaging: Images of a KUB performed today were personally reviewed and interpreted.  The previously noted renal calculi have migrated to the proximal ureter.  Stone density 1200-1800 HU   Assessment & Plan:    1.  Left proximal ureteral calculi We discussed various treatment options for urolithiasis including shockwave lithotripsy (SWL), ureteroscopy and laser lithotripsy with stent placement, and percutaneous nephrolithotomy.  Based on stone size spontaneous passage is unlikely We discussed that management is based on stone size, location, density, patient co-morbidities, and patient preference.  SWL has a lower stone free rate in a single procedure, but also a lower complication rate compared to ureteroscopy and avoids a stent and associated stent related symptoms. Possible complications include renal hematoma, steinstrasse, and need for additional treatment. Ureteroscopy with laser lithotripsy and stent placement has a higher stone free rate than SWL in a single procedure, however increased complication rate including possible infection, ureteral injury, bleeding, and stent related morbidity. Common stent related symptoms include dysuria, urgency/frequency, and flank pain. After a discussion of the risks and benefits of the above treatment options, the patient would like to proceed with ureteroscopic stone removal.  We did discuss and a low percentage of cases due to anatomy the stone is unable to be accessed with ureteroscope and treatment cannot be performed.  If this does occur a stent is placed with a  follow-up ureteroscopy after a 2-week period of passive stent dilation versus treatment changed to SWL Preoperative urine culture ordered   Abbie Sons, [place] [address], [place], [place] [address] [phone]

## 2022-09-17 NOTE — Progress Notes (Signed)
09/17/2022 3:02 PM   Briana Klein 07/02/1971 [id number]  Referring provider: Burnard Hawthorne, FNP [address] [place],  [place] [address]  Chief Complaint  Patient presents with   Nephrolithiasis    HPI: 51 y.o. female presents for follow-up of nephrolithiasis.  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  Presently notes occasional left flank twinges and occasional burning sensation when voiding Denies fever, chills or gross hematuria   PMH: Past Medical History:  Diagnosis Date   Abnormal stress test    Dr. Mancel Parsons   Anxiety    Asthma    inhaler   Chronic diarrhea    history   Dysrhythmia    History of kidney stones    Hyperlipidemia    Hypertension    Migraines    otc med prn   Seasonal allergies    SVD (spontaneous vaginal delivery)    x 3   Vitamin D deficiency     Surgical History: Past Surgical History:  Procedure Laterality Date   COLONOSCOPY N/A 03/30/2022   Procedure: COLONOSCOPY;  Surgeon: Lucilla Lame, MD;  Location: [facility];  Service: Endoscopy;  Laterality: N/A;   HYSTEROSCOPY WITH NOVASURE N/A 12/12/2013   Procedure: HYSTEROSCOPY WITH NOVASURE;  Surgeon: Logan Bores, MD;  Location: [facility];  Service: Gynecology;  Laterality: N/A;   LAPAROSCOPIC APPENDECTOMY N/A 07/09/2022   Procedure: APPENDECTOMY LAPAROSCOPIC;  Surgeon: Jules Husbands, MD;  Location: [facility];  Service: General;  Laterality: N/A;  Provider is requesting 1 hour(60 minutes) for this case    Home Medications:  Allergies as of 09/17/2022       Reactions   Amoxicillin-pot Clavulanate    Passed out, has taken  penicillin and amoxicillin w/no problems        Medication List        Accurate as of September 17, 2022  3:02 PM. If you have any questions, ask your nurse or doctor.          Advair HFA 115-21 MCG/ACT inhaler Generic drug: fluticasone-salmeterol INHALE 2 PUFFS INTO THE LUNGS TWICE DAILY AS NEEDED   albuterol 108 (90 Base) MCG/ACT inhaler Commonly known as: Ventolin HFA INHALE 2 PUFFS BY MOUTH INTO THE LUNGS EVERY 6 HOURS AS NEEDED   ALPRAZolam 0.25 MG tablet Commonly known as: XANAX Take 1 tablet (0.25 mg total) by mouth daily as needed for anxiety.   amLODipine 5 MG tablet Commonly known as: NORVASC TAKE 1 TABLET(5 MG) BY MOUTH DAILY   aspirin-acetaminophen-caffeine 250-250-65 MG tablet Commonly known as: EXCEDRIN MIGRAINE Take by mouth every 6 (six) hours as needed for headache.   fexofenadine 180 MG tablet Commonly known as: ALLEGRA Take 180 mg by mouth as needed for allergies or rhinitis.   traZODone 50 MG tablet Commonly known as: DESYREL TAKE 1 TABLET(50 MG) BY MOUTH AT BEDTIME AS NEEDED FOR SLEEP        Allergies:  Allergies  Allergen Reactions   Amoxicillin-Pot Clavulanate     Passed out, has taken penicillin and amoxicillin w/no problems    Family History: Family History  Problem Relation Age of Onset   Hypertension Mother    Heart  disease Father    Diabetes Father    Stroke Father    Heart disease Paternal Grandfather    Breast cancer Maternal Aunt    Colon cancer Maternal Aunt 73   Breast cancer Paternal Grandmother    Diabetes Other    Melanoma Neg Hx     Social History:  reports that she has never smoked. She has never used smokeless tobacco. She reports current alcohol use of about 1.0 standard drink of alcohol per week. She reports that she does not use drugs.   Physical Exam: BP (!) 152/83   Pulse 73   Ht '5\' 6"'$  (1.676 m)   Wt 160 lb (72.6 kg)   BMI 25.82 kg/m   Constitutional:  Alert and oriented, No acute distress. HEENT:  [place] AT, moist mucus membranes.  Trachea midline, no masses. Cardiovascular: No clubbing, cyanosis, or edema. Respiratory: Normal respiratory effort, no increased work of breathing. Neurologic: Grossly intact, no focal deficits, moving all 4 extremities. Psychiatric: Normal mood and affect.  Laboratory Data:  Urinalysis Dipstick 2+ blood/1+ protein Microscopy 6-10 WBC/11-30 RBC   Pertinent Imaging: Images of a KUB performed today were personally reviewed and interpreted.  The previously noted renal calculi have migrated to the proximal ureter.  Stone density 1200-1800 HU   Assessment & Plan:    1.  Left proximal ureteral calculi We discussed various treatment options for urolithiasis including shockwave lithotripsy (SWL), ureteroscopy and laser lithotripsy with stent placement, and percutaneous nephrolithotomy.  Based on stone size spontaneous passage is unlikely We discussed that management is based on stone size, location, density, patient co-morbidities, and patient preference.  SWL has a lower stone free rate in a single procedure, but also a lower complication rate compared to ureteroscopy and avoids a stent and associated stent related symptoms. Possible complications include renal hematoma, steinstrasse, and need for additional treatment. Ureteroscopy with laser lithotripsy and stent placement has a higher stone free rate than SWL in a single procedure, however increased complication rate including possible infection, ureteral injury, bleeding, and stent related morbidity. Common stent related symptoms include dysuria, urgency/frequency, and flank pain. After a discussion of the risks and benefits of the above treatment options, the patient would like to proceed with ureteroscopic stone removal.  We did discuss and a low percentage of cases due to anatomy the stone is unable to be accessed with ureteroscope and treatment cannot be performed.  If this does occur a stent is placed with a  follow-up ureteroscopy after a 2-week period of passive stent dilation versus treatment changed to SWL Preoperative urine culture ordered   Abbie Sons, [place] [address], [place], [place] [address] [phone]

## 2022-09-20 LAB — CULTURE, URINE COMPREHENSIVE

## 2022-09-22 ENCOUNTER — Telehealth: Payer: Self-pay

## 2022-09-22 NOTE — Progress Notes (Signed)
[facility] Posting Form   Surgery Date/Time: Date: 10/06/2022  Surgeon: Dr. John Giovanni, MD  Surgery Location: Day Surgery  Inpt ( No  )   Outpt (Yes)   Obs ( No  )   Diagnosis: N20.1 Left Ureteral Stone  -CPT: [phone]  Surgery: Left Ureteroscopy with Laser Lithotripsy and stent placement   Stop Anticoagulations: No, may continue ASA  Cardiac/Medical/Pulmonary Clearance needed: no  *Orders entered into EPIC  Date: 09/22/22   *Case booked in EPIC  Date: 09/17/2022  *Notified pt of Surgery: Date: 09/17/2022  PRE-OP UA & CX: yes, obtained in clinic on 09/17/2022, will obtain Urine Pregnancy before surgery  *Placed into Prior Authorization Work Fabio Bering Date: 09/22/22   Assistant/laser/rep:No

## 2022-09-22 NOTE — Telephone Encounter (Signed)
I spoke with Briana Klein. We have discussed possible surgery dates and Tuesday October 24th, 2023 was agreed upon by all parties. Patient given information about surgery date, what to expect pre-operatively and post operatively.  We discussed that a Pre-Admission Testing office will be calling to set up the pre-op visit that will take place prior to surgery, and that these appointments are typically done over the phone with a Pre-Admissions RN.  Informed patient that our office will communicate any additional care to be provided after surgery. Patients questions or concerns were discussed during our call. Advised to call our office should there be any additional information, questions or concerns that arise. Patient verbalized understanding.

## 2022-09-30 ENCOUNTER — Encounter
Admission: RE | Admit: 2022-09-30 | Discharge: 2022-09-30 | Disposition: A | Discharge status: Home / Self Care | Payer: Managed Care, Other (non HMO) | Source: Ambulatory Visit | Attending: Urology | Admitting: Urology

## 2022-09-30 ENCOUNTER — Other Ambulatory Visit: Payer: Self-pay

## 2022-09-30 DIAGNOSIS — Z01812 Encounter for preprocedural laboratory examination: Secondary | ICD-10-CM

## 2022-09-30 NOTE — Patient Instructions (Addendum)
Your procedure is scheduled MC:NOBSJGG 24, 2023 TUESDAY  Report to the Registration Desk on the 1st floor of the [facility]. To find out your arrival time, please call [phone] between [place] on: October 05, 2022 MONDAY If your arrival time is 6:00 am, do not arrive prior to that time as the [facility] entrance doors do not open until 6:00 am.  REMEMBER: Instructions that are not followed completely may result in serious medical risk, up to and including death; or upon the discretion of your surgeon and anesthesiologist your surgery may need to be rescheduled.  DO NOT EAT OR DRINK ANYTHING after midnight the night before surgery.  No gum chewing, lozengers or hard candies.    TAKE THESE MEDICATIONS THE MORNING OF SURGERY WITH A SIP OF WATER: NONE  Use inhalers on the day of surgery   One week prior to surgery: Stop Anti-inflammatories (NSAIDS) such as Advil, Aleve, Ibuprofen, Motrin, Naproxen, Naprosyn and Aspirin based products such as Excedrin, Goodys Powder, BC Powder. Stop ANY OVER THE COUNTER supplements until after surgery. You may however, continue to take Tylenol if needed for pain up until the day of surgery.  No Alcohol for 24 hours before or after surgery.  No Smoking including e-cigarettes for 24 hours prior to surgery.  No chewable tobacco products for at least 6 hours prior to surgery.  No nicotine patches on the day of surgery.  Do not use any "recreational" drugs for at least a week prior to your surgery.  Please be advised that the combination of cocaine and anesthesia may have negative outcomes, up to and including death. If you test positive for cocaine, your surgery will be cancelled.  On the morning of surgery brush your teeth with toothpaste and water, you may rinse your mouth with mouthwash if you wish. Do not swallow any toothpaste or mouthwash.  Use CHG Soap as directed on instruction sheet.  Do not wear jewelry, make-up, hairpins, clips or  nail polish.  Do not wear lotions, powders, or perfumes OR DEODORANT   Do not shave body from the neck down 48 hours prior to surgery just in case you cut yourself which could leave a site for infection.  Also, freshly shaved skin may become irritated if using the CHG soap.  Contact lenses, hearing aids and dentures may not be worn into surgery.  Do not bring valuables to the hospital. [facility] is not responsible for any missing/lost belongings or valuables.   Notify your doctor if there is any change in your medical condition (cold, fever, infection).  Wear comfortable clothing (specific to your surgery type) to the hospital.  After surgery, you can help prevent lung complications by doing breathing exercises.  Take deep breaths and cough every 1-2 hours. Your doctor may order a device called an Incentive Spirometer to help you take deep breaths. When coughing or sneezing, hold a pillow firmly against your incision with both hands. This is called "splinting." Doing this helps protect your incision. It also decreases belly discomfort.  If you are being discharged the day of surgery, you will not be allowed to drive home. You will need a responsible adult (18 years or older) to drive you home and stay with you that night.   If you are taking public transportation, you will need to have a responsible adult (18 years or older) with you. Please confirm with your physician that it is acceptable to use public transportation.   Please call the  Pre-admissions Testing Dept. at [phone] if you have any questions about these instructions.  Surgery Visitation Policy:  Patients undergoing a surgery or procedure may have two family members or support persons with them as long as the person is not COVID-19 positive or experiencing its symptoms.

## 2022-10-05 ENCOUNTER — Inpatient Hospital Stay: Admission: RE | Admit: 2022-10-05 | Payer: Managed Care, Other (non HMO) | Source: Ambulatory Visit

## 2022-10-06 ENCOUNTER — Ambulatory Visit: Payer: Managed Care, Other (non HMO)

## 2022-10-06 ENCOUNTER — Encounter: Payer: Self-pay | Admitting: Urology

## 2022-10-06 ENCOUNTER — Ambulatory Visit
Admission: RE | Admit: 2022-10-06 | Discharge: 2022-10-06 | Disposition: A | Discharge status: Home / Self Care | Payer: Managed Care, Other (non HMO) | Attending: Urology | Admitting: Urology

## 2022-10-06 ENCOUNTER — Ambulatory Visit: Payer: Managed Care, Other (non HMO) | Admitting: Urgent Care

## 2022-10-06 ENCOUNTER — Encounter
Admission: RE | Disposition: A | Discharge status: Home / Self Care | Payer: Self-pay | Source: Home / Self Care | Attending: Urology

## 2022-10-06 ENCOUNTER — Other Ambulatory Visit: Payer: Self-pay

## 2022-10-06 DIAGNOSIS — F419 Anxiety disorder, unspecified: Secondary | ICD-10-CM | POA: Insufficient documentation

## 2022-10-06 DIAGNOSIS — J45909 Unspecified asthma, uncomplicated: Secondary | ICD-10-CM | POA: Diagnosis not present

## 2022-10-06 DIAGNOSIS — N200 Calculus of kidney: Secondary | ICD-10-CM

## 2022-10-06 DIAGNOSIS — G43909 Migraine, unspecified, not intractable, without status migrainosus: Secondary | ICD-10-CM | POA: Diagnosis not present

## 2022-10-06 DIAGNOSIS — I10 Essential (primary) hypertension: Secondary | ICD-10-CM | POA: Insufficient documentation

## 2022-10-06 DIAGNOSIS — N201 Calculus of ureter: Secondary | ICD-10-CM | POA: Insufficient documentation

## 2022-10-06 DIAGNOSIS — Z01812 Encounter for preprocedural laboratory examination: Secondary | ICD-10-CM

## 2022-10-06 HISTORY — PX: CYSTOSCOPY/URETEROSCOPY/HOLMIUM LASER/STENT PLACEMENT: SHX6546

## 2022-10-06 LAB — POCT PREGNANCY, URINE: Preg Test, Ur: NEGATIVE

## 2022-10-06 SURGERY — CYSTOSCOPY/URETEROSCOPY/HOLMIUM LASER/STENT PLACEMENT
Anesthesia: General | Laterality: Left

## 2022-10-06 MED ORDER — DEXAMETHASONE SODIUM PHOSPHATE 10 MG/ML IJ SOLN
INTRAMUSCULAR | Status: DC | PRN
  Administered 2022-10-06: 10 mg via INTRAVENOUS

## 2022-10-06 MED ORDER — ACETAMINOPHEN 10 MG/ML IV SOLN
INTRAVENOUS | Status: AC
  Filled 2022-10-06: qty 100

## 2022-10-06 MED ORDER — LIDOCAINE HCL (PF) 2 % IJ SOLN
INTRAMUSCULAR | Status: AC
  Filled 2022-10-06: qty 5

## 2022-10-06 MED ORDER — FENTANYL CITRATE (PF) 100 MCG/2ML IJ SOLN
INTRAMUSCULAR | Status: DC | PRN
  Administered 2022-10-06 (×3): 25 ug via INTRAVENOUS

## 2022-10-06 MED ORDER — CHLORHEXIDINE GLUCONATE 0.12 % MT SOLN
OROMUCOSAL | Status: AC
  Administered 2022-10-06: 15 mL via OROMUCOSAL
  Filled 2022-10-06: qty 15

## 2022-10-06 MED ORDER — LACTATED RINGERS IV SOLN: INTRAVENOUS | Status: DC

## 2022-10-06 MED ORDER — ONDANSETRON HCL 4 MG/2ML IJ SOLN
INTRAMUSCULAR | Status: AC
  Filled 2022-10-06: qty 2

## 2022-10-06 MED ORDER — CEFAZOLIN SODIUM-DEXTROSE 2-4 GM/100ML-% IV SOLN
INTRAVENOUS | Status: AC
  Filled 2022-10-06: qty 100

## 2022-10-06 MED ORDER — ONDANSETRON HCL 4 MG/2ML IJ SOLN
INTRAMUSCULAR | Status: DC | PRN
  Administered 2022-10-06: 4 mg via INTRAVENOUS

## 2022-10-06 MED ORDER — CEFAZOLIN SODIUM-DEXTROSE 2-4 GM/100ML-% IV SOLN
2.0000 g | INTRAVENOUS | Status: AC
  Administered 2022-10-06: 2 g via INTRAVENOUS

## 2022-10-06 MED ORDER — TAMSULOSIN HCL 0.4 MG PO CAPS: 0.4000 mg | ORAL_CAPSULE | Freq: Every day | ORAL | 0 refills | Status: DC

## 2022-10-06 MED ORDER — FAMOTIDINE 20 MG PO TABS
20.0000 mg | ORAL_TABLET | Freq: Once | ORAL | Status: AC
  Administered 2022-10-06: 20 mg via ORAL

## 2022-10-06 MED ORDER — CHLORHEXIDINE GLUCONATE 0.12 % MT SOLN: 15.0000 mL | Freq: Once | OROMUCOSAL | Status: AC

## 2022-10-06 MED ORDER — ORAL CARE MOUTH RINSE: 15.0000 mL | Freq: Once | OROMUCOSAL | Status: AC

## 2022-10-06 MED ORDER — IOHEXOL 180 MG/ML  SOLN
INTRAMUSCULAR | Status: DC | PRN
  Administered 2022-10-06: 20 mL

## 2022-10-06 MED ORDER — MIDAZOLAM HCL 2 MG/2ML IJ SOLN
INTRAMUSCULAR | Status: AC
  Filled 2022-10-06: qty 2

## 2022-10-06 MED ORDER — DEXMEDETOMIDINE HCL IN NACL 80 MCG/20ML IV SOLN
INTRAVENOUS | Status: AC
  Filled 2022-10-06: qty 20

## 2022-10-06 MED ORDER — MIDAZOLAM HCL 2 MG/2ML IJ SOLN
INTRAMUSCULAR | Status: DC | PRN
  Administered 2022-10-06: 2 mg via INTRAVENOUS

## 2022-10-06 MED ORDER — LIDOCAINE HCL (CARDIAC) PF 100 MG/5ML IV SOSY
PREFILLED_SYRINGE | INTRAVENOUS | Status: DC | PRN
  Administered 2022-10-06: 80 mg via INTRAVENOUS

## 2022-10-06 MED ORDER — LACTATED RINGERS IV SOLN: INTRAVENOUS | Status: DC | PRN

## 2022-10-06 MED ORDER — PROPOFOL 10 MG/ML IV BOLUS
INTRAVENOUS | Status: AC
  Filled 2022-10-06: qty 20

## 2022-10-06 MED ORDER — ONDANSETRON HCL 4 MG/2ML IJ SOLN: 4.0000 mg | Freq: Once | INTRAMUSCULAR | Status: DC | PRN

## 2022-10-06 MED ORDER — PROPOFOL 10 MG/ML IV BOLUS
INTRAVENOUS | Status: DC | PRN
  Administered 2022-10-06: 50 mg via INTRAVENOUS
  Administered 2022-10-06: 150 mg via INTRAVENOUS

## 2022-10-06 MED ORDER — SODIUM CHLORIDE 0.9 % IR SOLN
Status: DC | PRN
  Administered 2022-10-06: 3000 mL

## 2022-10-06 MED ORDER — OXYBUTYNIN CHLORIDE 5 MG PO TABS: ORAL_TABLET | ORAL | 0 refills | Status: DC

## 2022-10-06 MED ORDER — DEXAMETHASONE SODIUM PHOSPHATE 10 MG/ML IJ SOLN
INTRAMUSCULAR | Status: AC
  Filled 2022-10-06: qty 1

## 2022-10-06 MED ORDER — ACETAMINOPHEN 10 MG/ML IV SOLN
INTRAVENOUS | Status: DC | PRN
  Administered 2022-10-06: 1000 mg via INTRAVENOUS

## 2022-10-06 MED ORDER — FENTANYL CITRATE (PF) 100 MCG/2ML IJ SOLN
INTRAMUSCULAR | Status: AC
  Filled 2022-10-06: qty 2

## 2022-10-06 MED ORDER — FENTANYL CITRATE (PF) 100 MCG/2ML IJ SOLN: 25.0000 ug | INTRAMUSCULAR | Status: DC | PRN

## 2022-10-06 MED ORDER — DIAZEPAM 10 MG PO TABS: ORAL_TABLET | ORAL | 0 refills | Status: DC

## 2022-10-06 MED ORDER — DEXMEDETOMIDINE HCL IN NACL 80 MCG/20ML IV SOLN
INTRAVENOUS | Status: DC | PRN
  Administered 2022-10-06 (×2): 4 ug via BUCCAL

## 2022-10-06 SURGICAL SUPPLY — 24 items
BAG DRAIN SIEMENS DORNER NS (MISCELLANEOUS) ×2 IMPLANT
BAG DRN NS LF (MISCELLANEOUS) ×1
BASKET ZERO TIP 1.9FR (BASKET) IMPLANT
BRUSH SCRUB EZ 1% IODOPHOR (MISCELLANEOUS) ×1 IMPLANT
BSKT STON RTRVL ZERO TP 1.9FR (BASKET) ×1
CNTNR SPEC 2.5X3XGRAD LEK (MISCELLANEOUS) ×1
CONT SPEC 4OZ STER OR WHT (MISCELLANEOUS) ×1
CONT SPEC 4OZ STRL OR WHT (MISCELLANEOUS) ×1
CONTAINER SPEC 2.5X3XGRAD LEK (MISCELLANEOUS) IMPLANT
DRAPE UTILITY 15X26 TOWEL STRL (DRAPES) ×1 IMPLANT
FIBER LASER MOSES 200 DFL (Laser) IMPLANT
GLOVE SURG UNDER POLY LF SZ7.5 (GLOVE) ×2 IMPLANT
GOWN STRL REUS W/ TWL LRG LVL3 (GOWN DISPOSABLE) ×1 IMPLANT
GOWN STRL REUS W/ TWL XL LVL3 (GOWN DISPOSABLE) ×1 IMPLANT
GOWN STRL REUS W/TWL LRG LVL3 (GOWN DISPOSABLE) ×1
GOWN STRL REUS W/TWL XL LVL3 (GOWN DISPOSABLE) ×1
GUIDEWIRE STR DUAL SENSOR (WIRE) ×1 IMPLANT
IV NS IRRIG 3000ML ARTHROMATIC (IV SOLUTION) ×1 IMPLANT
KIT TURNOVER CYSTO (KITS) ×1 IMPLANT
PACK CYSTO AR (MISCELLANEOUS) ×1 IMPLANT
SET CYSTO W/LG BORE CLAMP LF (SET/KITS/TRAYS/PACK) ×1 IMPLANT
STENT URET 6FRX24 CONTOUR (STENTS) IMPLANT
SURGILUBE 2OZ TUBE FLIPTOP (MISCELLANEOUS) ×1 IMPLANT
WATER STERILE IRR 500ML POUR (IV SOLUTION) ×1 IMPLANT

## 2022-10-06 NOTE — Op Note (Signed)
   Preoperative diagnosis: Left ureteral calculi  Postoperative diagnosis: Same  Procedure:  Cystoscopy Left ureteroscopy and stone removal Ureteroscopic laser lithotripsy Left ureteral stent placement (F/24 cm)  Left retrograde pyelography with interpretation Intraoperative fluoroscopy < 30 min  Surgeon: Nicki Reaper C. Giliana Vantil, M.D.  Anesthesia: General  Complications: None  Intraoperative findings:  Cystoscopy-bladder mucosa normal in appearance without erythema, solid or papillary lesions Ureteroscopy-ureteral calculi x2 left proximal ureter Left retrograde pyelography post procedure showed no filling defects, stone fragments or contrast extravasation  EBL: Minimal  Specimens: Calculus fragments for analysis   Indication: Briana Klein is a 51 y.o. female 6 and 8 mm left proximal ureteral calculi.  After reviewing the management options for treatment, the patient elected to proceed with the above surgical procedure(s). We have discussed the potential benefits and risks of the procedure, side effects of the proposed treatment, the likelihood of the patient achieving the goals of the procedure, and any potential problems that might occur during the procedure or recuperation. Informed consent has been obtained.  Description of procedure:  The patient was taken to the operating room and general anesthesia was induced.  The patient was placed in the dorsal lithotomy position, prepped and draped in the usual sterile fashion, and preoperative antibiotics were administered. A preoperative time-out was performed.   A 21 French cystoscope was lubricated and passed and passed per urethra.  Panendoscopy was performed with findings as described above.  Attention was directed to the left ureteral orifice and a 0.038 Sensor wire was then advanced up the ureter into the renal pelvis under fluoroscopic guidance.  The calculi were easily visualized on fluoroscopy.  A 4.5 Fr semirigid  ureteroscope was then advanced into the ureter next to the guidewire and advanced proximally where the 8 mm calculus was in the lower portion of the proximal ureter.  The stone was then dusted with a 200 m Moses holmium laser fiber on a setting of 0.3 J/40 Hz and subsequently increased to 0.3/80  All fragments were then removed from the ureter with a zero tip nitinol basket.  Reinspection of the ureter revealed no remaining visible stones or fragments.   Retrograde pyelogram was performed with findings as described above.  A 77F/24 cm Contour ureteral stent was placed under fluoroscopic guidance.  The wire was then removed with an adequate stent curl noted in the renal pelvis as well as in the bladder.  The bladder was then emptied and the procedure ended.  The patient appeared to tolerate the procedure well and without complications.  After anesthetic reversal the patient was transported to the PACU in stable condition.   Plan: She is scheduled for ureteral stent removal and office on 10/15/2022   John Giovanni, MD

## 2022-10-06 NOTE — Anesthesia Preprocedure Evaluation (Signed)
Anesthesia Evaluation  Patient identified by MRN, date of birth, ID band Patient awake    Reviewed: Allergy & Precautions, NPO status , Patient's Chart, lab work & pertinent test results  Airway Mallampati: II  TM Distance: >3 FB Neck ROM: Full    Dental  (+) Teeth Intact   Pulmonary neg pulmonary ROS, asthma ,    Pulmonary exam normal breath sounds clear to auscultation       Cardiovascular hypertension, negative cardio ROS Normal cardiovascular exam+ dysrhythmias Atrial Fibrillation  Rhythm:Regular Rate:Normal     Neuro/Psych  Headaches, Anxiety negative neurological ROS  negative psych ROS   GI/Hepatic negative GI ROS, Neg liver ROS,   Endo/Other  negative endocrine ROS  Renal/GU negative Renal ROS  negative genitourinary   Musculoskeletal negative musculoskeletal ROS (+)   Abdominal Normal abdominal exam  (+)   Peds negative pediatric ROS (+)  Hematology negative hematology ROS (+)   Anesthesia Other Findings Past Medical History: No date: Abnormal stress test     Comment:  Dr. Mancel Parsons No date: Anxiety No date: Asthma     Comment:  inhaler No date: Chronic diarrhea     Comment:  history No date: Dysrhythmia No date: History of kidney stones No date: Hyperlipidemia No date: Hypertension No date: Migraines     Comment:  otc med prn No date: Seasonal allergies No date: SVD (spontaneous vaginal delivery)     Comment:  x 3 No date: Vitamin D deficiency  Past Surgical History: 03/30/2022: COLONOSCOPY; N/A     Comment:  Procedure: COLONOSCOPY;  Surgeon: Lucilla Lame, MD;                Location: [facility];  Service: Endoscopy;                Laterality: N/A; 12/12/2013: HYSTEROSCOPY WITH NOVASURE; N/A     Comment:  Procedure: HYSTEROSCOPY WITH NOVASURE;  Surgeon: Logan Bores, MD;  Location: [facility];  Service: Gynecology;               Laterality: N/A; 07/09/2022:  LAPAROSCOPIC APPENDECTOMY; N/A     Comment:  Procedure: APPENDECTOMY LAPAROSCOPIC;  Surgeon: Jules Husbands, MD;  Location: [facility];  Service: General;                Laterality: N/A;  Provider is requesting 1 hour(60               minutes) for this case  BMI    Body Mass Index: 25.82 kg/m      Reproductive/Obstetrics negative OB ROS                             Anesthesia Physical Anesthesia Plan  ASA: 2  Anesthesia Plan: General   Post-op Pain Management:    Induction: Intravenous  PONV Risk Score and Plan: 1 and Ondansetron and Dexamethasone  Airway Management Planned: Oral ETT  Additional Equipment:   Intra-op Plan:   Post-operative Plan: Extubation in OR  Informed Consent: I have reviewed the patients History and Physical, chart, labs and discussed the procedure including the risks, benefits and alternatives for the proposed anesthesia with the patient or authorized representative who has indicated his/her understanding and acceptance.     Dental Advisory Given  Plan Discussed  with: CRNA and Surgeon  Anesthesia Plan Comments:         Anesthesia Quick Evaluation

## 2022-10-06 NOTE — Transfer of Care (Signed)
Immediate Anesthesia Transfer of Care Note  Patient: Briana Klein  Procedure(s) Performed: CYSTOSCOPY/URETEROSCOPY/HOLMIUM LASER/STENT PLACEMENT (Left)  Patient Location: PACU  Anesthesia Type:General  Level of Consciousness: drowsy  Airway & Oxygen Therapy: Patient Spontanous Breathing and Patient connected to face mask oxygen  Post-op Assessment: Report given to RN and Post -op Vital signs reviewed and stable  Post vital signs: Reviewed and stable  Last Vitals:  Vitals Value Taken Time  BP 109/66   Temp    Pulse 69 10/06/22 1010  Resp 13 10/06/22 1010  SpO2 99 % 10/06/22 1010  Vitals shown include unvalidated device data.  Last Pain:  Vitals:   10/06/22 0752  TempSrc: Temporal  PainSc: 0-No pain         Complications: No notable events documented.

## 2022-10-06 NOTE — Anesthesia Procedure Notes (Signed)
Procedure Name: LMA Insertion Date/Time: 10/06/2022 9:04 AM  Performed by: Jonna Clark, CRNAPre-anesthesia Checklist: Patient identified, Patient being monitored, Timeout performed, Emergency Drugs available and Suction available Patient Re-evaluated:Patient Re-evaluated prior to induction Oxygen Delivery Method: Circle system utilized Preoxygenation: Pre-oxygenation with 100% oxygen Induction Type: IV induction Ventilation: Mask ventilation without difficulty LMA: LMA inserted and LMA with gastric port inserted LMA Size: 4.0 Tube type: Oral Number of attempts: 1 Placement Confirmation: positive ETCO2 and breath sounds checked- equal and bilateral Tube secured with: Tape Dental Injury: Teeth and Oropharynx as per pre-operative assessment

## 2022-10-06 NOTE — Anesthesia Postprocedure Evaluation (Signed)
Anesthesia Post Note  Patient: Briana Klein  Procedure(s) Performed: CYSTOSCOPY/URETEROSCOPY/HOLMIUM LASER/STENT PLACEMENT (Left)  Patient location during evaluation: PACU Anesthesia Type: General Level of consciousness: awake and oriented Pain management: pain level controlled Vital Signs Assessment: post-procedure vital signs reviewed and stable Respiratory status: spontaneous breathing and respiratory function stable Cardiovascular status: stable Anesthetic complications: no   No notable events documented.   Last Vitals:  Vitals:   10/06/22 1045 10/06/22 1100  BP: 125/78 97/81  Pulse: (!) 55 62  Resp: (!) 8 16  Temp:  (!) 36.1 C  SpO2: 100% 100%    Last Pain:  Vitals:   10/06/22 1100  TempSrc:   PainSc: 0-No pain                 VAN STAVEREN,Marianela Mandrell

## 2022-10-06 NOTE — Discharge Instructions (Addendum)
AMBULATORY SURGERY  DISCHARGE INSTRUCTIONS   The drugs that you were given will stay in your system until tomorrow so for the next 24 hours you should not:  Drive an automobile Make any legal decisions Drink any alcoholic beverage   You may resume regular meals tomorrow.  Today it is better to start with liquids and gradually work up to solid foods.  You may eat anything you prefer, but it is better to start with liquids, then soup and crackers, and gradually work up to solid foods.   Please notify your doctor immediately if you have any unusual bleeding, trouble breathing, redness and pain at the surgery site, drainage, fever, or pain not relieved by medication.  Hospital Number [phone]     DISCHARGE INSTRUCTIONS FOR KIDNEY STONE/URETERAL STENT   MEDICATIONS:  1. Resume all your other meds from home.  2.  AZO (over-the-counter) can help with the burning/stinging when you urinate. 3.  Oxybutynin and tamsulosin are for stent/bladder irritation, Rxs were sent to your pharmacy.  ACTIVITY:  1. May resume regular activities in 24 hours. 2. No driving while on narcotic pain medications  3. Drink plenty of water  4. Continue to walk at home - you can still get blood clots when you are at home, so keep active, but don't over do it.  5. May return to work/school tomorrow or when you feel ready   BATHING:  1. You can shower. 2. You have a string coming from your urethra: The stent string is attached to your ureteral stent. Do not pull on this.   SIGNS/SYMPTOMS TO CALL:  Common postoperative symptoms include urinary frequency, urgency, bladder spasm and blood in the urine  Please call us if you have a fever greater than 101.5, uncontrolled nausea/vomiting, uncontrolled pain, dizziness, unable to urinate, excessively bloody urine, chest pain, shortness of breath, leg swelling, leg pain, or any other concerns or questions.   You can reach [place] at [phone].   FOLLOW-UP:  1.  You have a follow-up appointment scheduled 10/15/2022 for stent removal

## 2022-10-06 NOTE — Interval H&P Note (Signed)
History and Physical Interval Note:  CV:RRR Lungs:clear  10/06/2022 8:45 AM  Briana Klein  has presented today for surgery, with the diagnosis of Left Ureteral Stone.  The various methods of treatment have been discussed with the patient and family. After consideration of risks, benefits and other options for treatment, the patient has consented to  Procedure(s): CYSTOSCOPY/URETEROSCOPY/HOLMIUM LASER/STENT PLACEMENT (Left) as a surgical intervention.  The patient's history has been reviewed, patient examined, no change in status, stable for surgery.  I have reviewed the patient's chart and labs.  Questions were answered to the patient's satisfaction.     Agency

## 2022-10-06 NOTE — Interval H&P Note (Signed)
History and Physical Interval Note:  10/06/2022 8:44 AM  Briana Klein  has presented today for surgery, with the diagnosis of Left Ureteral Stone.  The various methods of treatment have been discussed with the patient and family. After consideration of risks, benefits and other options for treatment, the patient has consented to  Procedure(s): CYSTOSCOPY/URETEROSCOPY/HOLMIUM LASER/STENT PLACEMENT (Left) as a surgical intervention.  The patient's history has been reviewed, patient examined, no change in status, stable for surgery.  I have reviewed the patient's chart and labs.  Questions were answered to the patient's satisfaction.     [place]

## 2022-10-08 ENCOUNTER — Other Ambulatory Visit: Payer: Self-pay | Admitting: Urology

## 2022-10-08 ENCOUNTER — Other Ambulatory Visit
Admission: RE | Admit: 2022-10-08 | Discharge: 2022-10-08 | Disposition: A | Discharge status: Home / Self Care | Payer: Managed Care, Other (non HMO) | Source: Ambulatory Visit | Attending: Urology | Admitting: Urology

## 2022-10-08 DIAGNOSIS — N201 Calculus of ureter: Secondary | ICD-10-CM

## 2022-10-09 ENCOUNTER — Other Ambulatory Visit: Payer: Self-pay | Admitting: Urology

## 2022-10-13 ENCOUNTER — Other Ambulatory Visit: Payer: Self-pay | Admitting: Urology

## 2022-10-15 ENCOUNTER — Ambulatory Visit: Payer: Managed Care, Other (non HMO) | Admitting: Urology

## 2022-10-15 ENCOUNTER — Encounter: Payer: Self-pay | Admitting: Urology

## 2022-10-15 VITALS — BP 122/78 | HR 70 | Ht 66.0 in | Wt 160.0 lb

## 2022-10-15 DIAGNOSIS — N201 Calculus of ureter: Secondary | ICD-10-CM

## 2022-10-15 LAB — CALCULI, WITH PHOTOGRAPH (CLINICAL LAB)
Calcium Oxalate Dihydrate: 30 %
Calcium Oxalate Monohydrate: 70 %
Weight Calculi: 11 mg

## 2022-10-15 LAB — URINALYSIS, COMPLETE
Bilirubin, UA: NEGATIVE
Glucose, UA: NEGATIVE
Nitrite, UA: NEGATIVE
Specific Gravity, UA: 1.03 (ref 1.005–1.030)
Urobilinogen, Ur: 0.2 mg/dL (ref 0.2–1.0)
pH, UA: 5 (ref 5.0–7.5)

## 2022-10-15 LAB — MICROSCOPIC EXAMINATION: RBC, Urine: >30 /hpf — AB (ref 0–2)

## 2022-10-15 MED ORDER — CIPROFLOXACIN HCL 500 MG PO TABS
500.0000 mg | ORAL_TABLET | Freq: Once | ORAL | Status: AC
  Administered 2022-10-15: 500 mg via ORAL

## 2022-10-15 NOTE — Progress Notes (Signed)
   Indications: Patient is 51 y.o., who is s/p ureteroscopic removal of 6 and 8 mm left proximal ureteral calculi on 10/06/2022.  She had no postoperative problems.  The patient is presenting today for stent removal.  Procedure:  Flexible Cystoscopy with stent removal (70761)  Timeout was performed and the correct patient, procedure and participants were identified.    Description:  The patient was prepped and draped in the usual sterile fashion. Flexible cystosopy was performed.  The stent was visualized, grasped, and removed intact without difficulty. The patient tolerated the procedure well.  A single dose of oral antibiotics was given.  Complications:  None  Plan:  Instructed to call for fever/flank pain post removal Stone analysis pending Follow-up 1 month to discuss stone analysis and metabolic evaluation   John Giovanni, MD

## 2022-11-13 ENCOUNTER — Ambulatory Visit: Payer: Managed Care, Other (non HMO) | Admitting: Urology

## 2022-11-13 ENCOUNTER — Encounter: Payer: Self-pay | Admitting: Urology

## 2022-12-23 ENCOUNTER — Ambulatory Visit: Payer: Managed Care, Other (non HMO) | Admitting: Family

## 2022-12-23 ENCOUNTER — Encounter: Payer: Self-pay | Admitting: Family

## 2022-12-23 VITALS — BP 130/80 | HR 77 | Temp 98.3°F | Ht 66.0 in | Wt 159.2 lb

## 2022-12-23 DIAGNOSIS — R300 Dysuria: Secondary | ICD-10-CM | POA: Insufficient documentation

## 2022-12-23 MED ORDER — NITROFURANTOIN MONOHYD MACRO 100 MG PO CAPS: 100.0000 mg | ORAL_CAPSULE | Freq: Two times a day (BID) | ORAL | 0 refills | Status: DC

## 2022-12-23 NOTE — Patient Instructions (Signed)
Start Macrobid, antibiotic. Plenty of water    Please stay incredibly vigilant if you begin to feel unwell ,continue to have fevers, chills, please let me know as I would recommend a CT renal scan. Ensure to take probiotics while on antibiotics and also for 2 weeks after completion. This can either be by eating yogurt daily or taking a probiotic supplement over the counter such as Culturelle.It is important to re-colonize the gut with good bacteria and also to prevent any diarrheal infections associated with antibiotic use.   Urinary Tract Infection, Adult  A urinary tract infection (UTI) is an infection of any part of the urinary tract. The urinary tract includes the kidneys, ureters, bladder, and urethra. These organs make, store, and get rid of urine in the body. An upper UTI affects the ureters and kidneys. A lower UTI affects the bladder and urethra. What are the causes? Most urinary tract infections are caused by bacteria in your genital area around your urethra, where urine leaves your body. These bacteria grow and cause inflammation of your urinary tract. What increases the risk? You are more likely to develop this condition if: You have a urinary catheter that stays in place. You are not able to control when you urinate or have a bowel movement (incontinence). You are female and you: Use a spermicide or diaphragm for birth control. Have low estrogen levels. Are pregnant. You have certain genes that increase your risk. You are sexually active. You take antibiotic medicines. You have a condition that causes your flow of urine to slow down, such as: An enlarged prostate, if you are female. Blockage in your urethra. A kidney stone. A nerve condition that affects your bladder control (neurogenic bladder). Not getting enough to drink, or not urinating often. You have certain medical conditions, such as: Diabetes. A weak disease-fighting system (immunesystem). Sickle cell  disease. Gout. Spinal cord injury. What are the signs or symptoms? Symptoms of this condition include: Needing to urinate right away (urgency). Frequent urination. This may include small amounts of urine each time you urinate. Pain or burning with urination. Blood in the urine. Urine that smells bad or unusual. Trouble urinating. Cloudy urine. Vaginal discharge, if you are female. Pain in the abdomen or the lower back. You may also have: Vomiting or a decreased appetite. Confusion. Irritability or tiredness. A fever or chills. Diarrhea. The first symptom in older adults may be confusion. In some cases, they may not have any symptoms until the infection has worsened. How is this diagnosed? This condition is diagnosed based on your medical history and a physical exam. You may also have other tests, including: Urine tests. Blood tests. Tests for STIs (sexually transmitted infections). If you have had more than one UTI, a cystoscopy or imaging studies may be done to determine the cause of the infections. How is this treated? Treatment for this condition includes: Antibiotic medicine. Over-the-counter medicines to treat discomfort. Drinking enough water to stay hydrated. If you have frequent infections or have other conditions such as a kidney stone, you may need to see a health care provider who specializes in the urinary tract (urologist). In rare cases, urinary tract infections can cause sepsis. Sepsis is a life-threatening condition that occurs when the body responds to an infection. Sepsis is treated in the hospital with IV antibiotics, fluids, and other medicines. Follow these instructions at home:  Medicines Take over-the-counter and prescription medicines only as told by your health care provider. If you were prescribed an antibiotic medicine,  take it as told by your health care provider. Do not stop using the antibiotic even if you start to feel better. General  instructions Make sure you: Empty your bladder often and completely. Do not hold urine for long periods of time. Empty your bladder after sex. Wipe from front to back after urinating or having a bowel movement if you are female. Use each tissue only one time when you wipe. Drink enough fluid to keep your urine pale yellow. Keep all follow-up visits. This is important. Contact a health care provider if: Your symptoms do not get better after 1-2 days. Your symptoms go away and then return. Get help right away if: You have severe pain in your back or your lower abdomen. You have a fever or chills. You have nausea or vomiting. Summary A urinary tract infection (UTI) is an infection of any part of the urinary tract, which includes the kidneys, ureters, bladder, and urethra. Most urinary tract infections are caused by bacteria in your genital area. Treatment for this condition often includes antibiotic medicines. If you were prescribed an antibiotic medicine, take it as told by your health care provider. Do not stop using the antibiotic even if you start to feel better. Keep all follow-up visits. This is important. This information is not intended to replace advice given to you by your health care provider. Make sure you discuss any questions you have with your health care provider. Document Revised: 07/12/2020 Document Reviewed: 07/12/2020 Elsevier Patient Education  [employer].

## 2022-12-23 NOTE — Progress Notes (Signed)
Assessment & Plan:  Dysuria Assessment & Plan: Unable to obtain POC urine as patient wasn't able to urinate enough.  Based on symptoms and escalation thereof, we have opted to treat promptly with antibiotics.  She will start Macrobid, probiotics.  She will stay hypervigilant.  Long discussion with patient and husband in regards to fever and ordering CT renal to evaluate for pyelonephritis.  I doubt renal stone due to lack of flank pain.  She understands not treating pyelonephritis and risk thereof would like to start antibiotics first and stay vigilant for symptoms.  Baseline labs ordered. She will let me know how she is doing.  Orders: -     Nitrofurantoin Monohyd Macro; Take 1 capsule (100 mg total) by mouth 2 (two) times daily. Take with food.  Dispense: 10 capsule; Refill: 0 -     CBC with Differential/Platelet -     Comprehensive metabolic panel     Return precautions given.   Risks, benefits, and alternatives of the medications and treatment plan prescribed today were discussed, and patient expressed understanding.   Education regarding symptom management and diagnosis given to patient on AVS either electronically or printed.  No follow-ups on file.  Mable Paris, FNP  Subjective:    Patient ID: Briana Klein, female    DOB: [birth year], 52 y.o.   MRN: [id number]  CC: Briana Klein is a 52 y.o. female who presents today for an acute visit.    HPI: Accompanied by husband.    Complains of mild dysuria, with urine odor, cloudiness onset 6 days ago.  She has since developed severe dysuria , chills 2 days ago, She has had a fever Tmax 102.1  She had HA this morning and took excredrin.   No cough, sob, flank pain, pain, suprapubic pain  Drinking water.   H/o left sided renal stones, s/p lithotripsy 10/15/22   Allergies: Amoxicillin-pot clavulanate Current Outpatient Medications on File Prior to Visit  Medication Sig Dispense Refill   acetaminophen (TYLENOL) 325 MG  tablet Take 650 mg by mouth every 6 (six) hours as needed for moderate pain.     albuterol (VENTOLIN HFA) 108 (90 Base) MCG/ACT inhaler INHALE 2 PUFFS BY MOUTH INTO THE LUNGS EVERY 6 HOURS AS NEEDED 18 g 3   ALPRAZolam (XANAX) 0.25 MG tablet Take 1 tablet (0.25 mg total) by mouth daily as needed for anxiety. 15 tablet 0   amLODipine (NORVASC) 5 MG tablet TAKE 1 TABLET(5 MG) BY MOUTH DAILY 90 tablet 1   aspirin-acetaminophen-caffeine (EXCEDRIN MIGRAINE) 250-250-65 MG tablet Take by mouth every 6 (six) hours as needed for headache.     diazepam (VALIUM) 10 MG tablet 1 tab po 30 min prior to office procedure on 10/15/2022 1 tablet 0   fexofenadine (ALLEGRA) 180 MG tablet Take 180 mg by mouth as needed for allergies or rhinitis.     fluticasone-salmeterol (ADVAIR HFA) 115-21 MCG/ACT inhaler INHALE 2 PUFFS INTO THE LUNGS TWICE DAILY AS NEEDED 12 g 0   oxybutynin (DITROPAN) 5 MG tablet 1 tab tid prn frequency,urgency, bladder spasm 20 tablet 0   tamsulosin (FLOMAX) 0.4 MG CAPS capsule Take 1 capsule (0.4 mg total) by mouth daily after breakfast. 10 capsule 0   traZODone (DESYREL) 50 MG tablet TAKE 1 TABLET(50 MG) BY MOUTH AT BEDTIME AS NEEDED FOR SLEEP 90 tablet 1   No current facility-administered medications on file prior to visit.    Review of Systems  Constitutional:  Positive for chills and fever.  HENT:  Negative for congestion.   Respiratory:  Negative for cough and shortness of breath.   Cardiovascular:  Negative for chest pain and palpitations.  Gastrointestinal:  Negative for nausea and vomiting.  Genitourinary:  Positive for dysuria and frequency. Negative for hematuria.      Objective:    There were no vitals taken for this visit.  BP Readings from Last 3 Encounters:  10/15/22 122/78  10/06/22 (!) 155/84  09/17/22 (!) 152/83   Wt Readings from Last 3 Encounters:  10/15/22 160 lb (72.6 kg)  10/06/22 160 lb (72.6 kg)  09/17/22 160 lb (72.6 kg)    Physical Exam Vitals  reviewed.  Constitutional:      Appearance: Normal appearance. She is well-developed.  Eyes:     Conjunctiva/sclera: Conjunctivae normal.  Cardiovascular:     Rate and Rhythm: Normal rate and regular rhythm.     Pulses: Normal pulses.     Heart sounds: Normal heart sounds.  Pulmonary:     Effort: Pulmonary effort is normal.     Breath sounds: Normal breath sounds. No wheezing, rhonchi or rales.  Abdominal:     General: Bowel sounds are normal. There is no distension.     Palpations: Abdomen is soft. Abdomen is not rigid. There is no fluid wave or mass.     Tenderness: There is no abdominal tenderness. There is no guarding or rebound.  Skin:    General: Skin is warm and dry.  Neurological:     Mental Status: She is alert.  Psychiatric:        Speech: Speech normal.        Behavior: Behavior normal.        Thought Content: Thought content normal.

## 2022-12-23 NOTE — Assessment & Plan Note (Signed)
Unable to obtain POC urine as patient wasn't able to urinate enough.  Based on symptoms and escalation thereof, we have opted to treat promptly with antibiotics.  She will start Macrobid, probiotics.  She will stay hypervigilant.  Long discussion with patient and husband in regards to fever and ordering CT renal to evaluate for pyelonephritis.  I doubt renal stone due to lack of flank pain.  She understands not treating pyelonephritis and risk thereof would like to start antibiotics first and stay vigilant for symptoms.  Baseline labs ordered. She will let me know how she is doing.

## 2022-12-24 ENCOUNTER — Other Ambulatory Visit: Payer: Self-pay | Admitting: Family

## 2022-12-24 DIAGNOSIS — R300 Dysuria: Secondary | ICD-10-CM

## 2022-12-24 LAB — COMPREHENSIVE METABOLIC PANEL
ALT: 47 IU/L — ABNORMAL HIGH (ref 0–32)
AST: 43 IU/L — ABNORMAL HIGH (ref 0–40)
Albumin/Globulin Ratio: 1.6 (ref 1.2–2.2)
Albumin: 4.5 g/dL (ref 3.8–4.9)
Alkaline Phosphatase: 99 IU/L (ref 44–121)
BUN/Creatinine Ratio: 13 (ref 9–23)
BUN: 15 mg/dL (ref 6–24)
Bilirubin Total: 1.1 mg/dL (ref 0.0–1.2)
CO2: 21 mmol/L (ref 20–29)
Calcium: 9.6 mg/dL (ref 8.7–10.2)
Chloride: 99 mmol/L (ref 96–106)
Creatinine, Ser: 1.14 mg/dL — ABNORMAL HIGH (ref 0.57–1.00)
Globulin, Total: 2.8 g/dL (ref 1.5–4.5)
Glucose: 173 mg/dL — ABNORMAL HIGH (ref 70–99)
Potassium: 3.7 mmol/L (ref 3.5–5.2)
Sodium: 139 mmol/L (ref 134–144)
Total Protein: 7.3 g/dL (ref 6.0–8.5)
eGFR: 58 mL/min/{1.73_m2} — ABNORMAL LOW (ref >59)

## 2022-12-24 LAB — CBC WITH DIFFERENTIAL/PLATELET
Basophils Absolute: 0.1 10*3/uL (ref 0.0–0.2)
Basos: 1 %
EOS (ABSOLUTE): 0 10*3/uL (ref 0.0–0.4)
Eos: 0 %
Hematocrit: 37.3 % (ref 34.0–46.6)
Hemoglobin: 12.6 g/dL (ref 11.1–15.9)
Immature Grans (Abs): 0.1 10*3/uL (ref 0.0–0.1)
Immature Granulocytes: 1 %
Lymphocytes Absolute: 1.2 10*3/uL (ref 0.7–3.1)
Lymphs: 13 %
MCH: 30.7 pg (ref 26.6–33.0)
MCHC: 33.8 g/dL (ref 31.5–35.7)
MCV: 91 fL (ref 79–97)
Monocytes Absolute: 0.8 10*3/uL (ref 0.1–0.9)
Monocytes: 9 %
Neutrophils Absolute: 7 10*3/uL (ref 1.4–7.0)
Neutrophils: 76 %
Platelets: 233 10*3/uL (ref 150–450)
RBC: 4.11 x10E6/uL (ref 3.77–5.28)
RDW: 14.2 % (ref 11.7–15.4)
WBC: 9.1 10*3/uL (ref 3.4–10.8)

## 2022-12-25 ENCOUNTER — Other Ambulatory Visit: Payer: Self-pay | Admitting: Family

## 2022-12-25 ENCOUNTER — Telehealth: Payer: Self-pay

## 2022-12-25 DIAGNOSIS — R899 Unspecified abnormal finding in specimens from other organs, systems and tissues: Secondary | ICD-10-CM

## 2022-12-25 LAB — URINE CULTURE
MICRO NUMBER:: 14413327
SPECIMEN QUALITY:: ADEQUATE

## 2022-12-25 NOTE — Telephone Encounter (Signed)
LVM to call back to office  

## 2022-12-25 NOTE — Telephone Encounter (Signed)
Pt called in staying she's returning [place] phone call. I called [place], she's not at her desk. She will be available at [phone]

## 2022-12-27 ENCOUNTER — Encounter: Payer: Self-pay | Admitting: Family

## 2022-12-28 NOTE — Telephone Encounter (Signed)
LVM to call back.

## 2022-12-31 NOTE — Telephone Encounter (Signed)
LVM to call back to office  

## 2023-01-06 ENCOUNTER — Other Ambulatory Visit (INDEPENDENT_AMBULATORY_CARE_PROVIDER_SITE_OTHER): Payer: Managed Care, Other (non HMO)

## 2023-01-06 DIAGNOSIS — R899 Unspecified abnormal finding in specimens from other organs, systems and tissues: Secondary | ICD-10-CM

## 2023-01-07 LAB — COMPREHENSIVE METABOLIC PANEL
ALT: 15 IU/L (ref 0–32)
AST: 14 IU/L (ref 0–40)
Albumin/Globulin Ratio: 1.9 (ref 1.2–2.2)
Albumin: 4.3 g/dL (ref 3.8–4.9)
Alkaline Phosphatase: 82 IU/L (ref 44–121)
BUN/Creatinine Ratio: 18 (ref 9–23)
BUN: 16 mg/dL (ref 6–24)
Bilirubin Total: 0.4 mg/dL (ref 0.0–1.2)
CO2: 24 mmol/L (ref 20–29)
Calcium: 9.4 mg/dL (ref 8.7–10.2)
Chloride: 105 mmol/L (ref 96–106)
Creatinine, Ser: 0.88 mg/dL (ref 0.57–1.00)
Globulin, Total: 2.3 g/dL (ref 1.5–4.5)
Glucose: 150 mg/dL — ABNORMAL HIGH (ref 70–99)
Potassium: 3.8 mmol/L (ref 3.5–5.2)
Sodium: 144 mmol/L (ref 134–144)
Total Protein: 6.6 g/dL (ref 6.0–8.5)
eGFR: 80 mL/min/{1.73_m2} (ref >59)

## 2023-01-07 LAB — URINALYSIS, ROUTINE W REFLEX MICROSCOPIC
Bilirubin, UA: NEGATIVE
Glucose, UA: NEGATIVE
Ketones, UA: NEGATIVE
Leukocytes,UA: NEGATIVE
Nitrite, UA: NEGATIVE
Protein,UA: NEGATIVE
RBC, UA: NEGATIVE
Specific Gravity, UA: 1.028 (ref 1.005–1.030)
Urobilinogen, Ur: 0.2 mg/dL (ref 0.2–1.0)
pH, UA: 5.5 (ref 5.0–7.5)

## 2023-01-07 LAB — ACUTE HEP PANEL AND HEP B SURFACE AB
Hep A IgM: NEGATIVE
Hep B C IgM: NEGATIVE
Hep C Virus Ab: NONREACTIVE
Hepatitis B Surf Ab Quant: 4.4 m[IU]/mL — ABNORMAL LOW (ref >9.9)
Hepatitis B Surface Ag: NEGATIVE

## 2023-01-07 LAB — CBC WITH DIFFERENTIAL/PLATELET
Basophils Absolute: 0.1 10*3/uL (ref 0.0–0.2)
Basos: 2 %
EOS (ABSOLUTE): 0.2 10*3/uL (ref 0.0–0.4)
Eos: 4 %
Hematocrit: 35 % (ref 34.0–46.6)
Hemoglobin: 11.7 g/dL (ref 11.1–15.9)
Immature Grans (Abs): 0 10*3/uL (ref 0.0–0.1)
Immature Granulocytes: 0 %
Lymphocytes Absolute: 1.5 10*3/uL (ref 0.7–3.1)
Lymphs: 35 %
MCH: 29.8 pg (ref 26.6–33.0)
MCHC: 33.4 g/dL (ref 31.5–35.7)
MCV: 89 fL (ref 79–97)
Monocytes Absolute: 0.3 10*3/uL (ref 0.1–0.9)
Monocytes: 6 %
Neutrophils Absolute: 2.4 10*3/uL (ref 1.4–7.0)
Neutrophils: 53 %
Platelets: 277 10*3/uL (ref 150–450)
RBC: 3.93 x10E6/uL (ref 3.77–5.28)
RDW: 14.5 % (ref 11.7–15.4)
WBC: 4.5 10*3/uL (ref 3.4–10.8)

## 2023-01-07 LAB — ANA W/REFLEX IF POSITIVE: Anti Nuclear Antibody (ANA): NEGATIVE

## 2023-01-07 LAB — TSH: TSH: 2.84 u[IU]/mL (ref 0.450–4.500)

## 2023-01-07 LAB — CK: Total CK: 64 U/L (ref 32–182)

## 2023-01-07 LAB — IRON,TIBC AND FERRITIN PANEL
Ferritin: 286 ng/mL — ABNORMAL HIGH (ref 15–150)
Iron Saturation: 27 % (ref 15–55)
Iron: 65 ug/dL (ref 27–159)
Total Iron Binding Capacity: 244 ug/dL — ABNORMAL LOW (ref 250–450)
UIBC: 179 ug/dL (ref 131–425)

## 2023-01-08 ENCOUNTER — Other Ambulatory Visit: Payer: Self-pay | Admitting: Family

## 2023-01-08 DIAGNOSIS — R899 Unspecified abnormal finding in specimens from other organs, systems and tissues: Secondary | ICD-10-CM

## 2023-01-08 LAB — URINE CULTURE

## 2023-02-18 ENCOUNTER — Other Ambulatory Visit: Payer: Self-pay | Admitting: Family

## 2023-02-18 DIAGNOSIS — I10 Essential (primary) hypertension: Secondary | ICD-10-CM

## 2023-03-02 ENCOUNTER — Telehealth: Payer: Self-pay

## 2023-03-02 DIAGNOSIS — G4700 Insomnia, unspecified: Secondary | ICD-10-CM

## 2023-03-02 DIAGNOSIS — I10 Essential (primary) hypertension: Secondary | ICD-10-CM

## 2023-03-02 MED ORDER — AMLODIPINE BESYLATE 5 MG PO TABS: ORAL_TABLET | ORAL | 1 refills | Status: DC

## 2023-03-02 MED ORDER — TRAZODONE HCL 50 MG PO TABS: ORAL_TABLET | ORAL | 1 refills | Status: DC

## 2023-03-02 NOTE — Telephone Encounter (Signed)
Prescription Request  03/02/2023  LOV: Visit date not found  What is the name of the medication or equipment?  traZODone (DESYREL) 50 MG tablet and  amLODipine (NORVASC) 5 MG tablet    Have you contacted your pharmacy to request a refill? Yes   Which pharmacy would you like this sent to?  [facility] [place], [address] AT [address] [place] [address] Phone: [phone] Fax: [phone]    Patient notified that their request is being sent to the clinical staff for review and that they should receive a response within 2 business days.   Please advise at Mobile [phone] (mobile)  Patient states her pharmacy told her that her provider denied her refills for these medications.  Patient states she has at least a week left for each.  Patient has scheduled a physical with Mable Paris, FNP, for 04/14/2023 and will need enough medication to get through this appointment.

## 2023-03-02 NOTE — Telephone Encounter (Signed)
LVM to inform pt that rx's were sent in to pharmacy. They will notify pt when ready

## 2023-04-14 ENCOUNTER — Encounter: Payer: Self-pay | Admitting: Family

## 2023-04-14 ENCOUNTER — Ambulatory Visit (INDEPENDENT_AMBULATORY_CARE_PROVIDER_SITE_OTHER): Payer: Managed Care, Other (non HMO) | Admitting: Family

## 2023-04-14 VITALS — BP 118/78 | HR 74 | Temp 97.8°F | Ht 66.5 in | Wt 162.1 lb

## 2023-04-14 DIAGNOSIS — Z1231 Encounter for screening mammogram for malignant neoplasm of breast: Secondary | ICD-10-CM

## 2023-04-14 DIAGNOSIS — M545 Low back pain, unspecified: Secondary | ICD-10-CM

## 2023-04-14 DIAGNOSIS — N912 Amenorrhea, unspecified: Secondary | ICD-10-CM

## 2023-04-14 DIAGNOSIS — Z124 Encounter for screening for malignant neoplasm of cervix: Secondary | ICD-10-CM | POA: Diagnosis not present

## 2023-04-14 DIAGNOSIS — Z136 Encounter for screening for cardiovascular disorders: Secondary | ICD-10-CM

## 2023-04-14 DIAGNOSIS — Z0000 Encounter for general adult medical examination without abnormal findings: Secondary | ICD-10-CM

## 2023-04-14 DIAGNOSIS — Z13220 Encounter for screening for lipoid disorders: Secondary | ICD-10-CM

## 2023-04-14 DIAGNOSIS — E559 Vitamin D deficiency, unspecified: Secondary | ICD-10-CM

## 2023-04-14 NOTE — Patient Instructions (Signed)
Nice to see you today  Health Maintenance for Postmenopausal Women Menopause is a normal process in which your ability to get pregnant comes to an end. This process happens slowly over many months or years, usually between the ages of 32 and 52. Menopause is complete when you have missed your menstrual period for 12 months. It is important to talk with your health care provider about some of the most common conditions that affect women after menopause (postmenopausal women). These include heart disease, cancer, and bone loss (osteoporosis). Adopting a healthy lifestyle and getting preventive care can help to promote your health and wellness. The actions you take can also lower your chances of developing some of these common conditions. What are the signs and symptoms of menopause? During menopause, you may have the following symptoms: Hot flashes. These can be moderate or severe. Night sweats. Decrease in sex drive. Mood swings. Headaches. Tiredness (fatigue). Irritability. Memory problems. Problems falling asleep or staying asleep. Talk with your health care provider about treatment options for your symptoms. Do I need hormone replacement therapy? Hormone replacement therapy is effective in treating symptoms that are caused by menopause, such as hot flashes and night sweats. Hormone replacement carries certain risks, especially as you become older. If you are thinking about using estrogen or estrogen with progestin, discuss the benefits and risks with your health care provider. How can I reduce my risk for heart disease and stroke? The risk of heart disease, heart attack, and stroke increases as you age. One of the causes may be a change in the body's hormones during menopause. This can affect how your body uses dietary fats, triglycerides, and cholesterol. Heart attack and stroke are medical emergencies. There are many things that you can do to help prevent heart disease and stroke. Watch  your blood pressure High blood pressure causes heart disease and increases the risk of stroke. This is more likely to develop in people who have high blood pressure readings or are overweight. Have your blood pressure checked: Every 3-5 years if you are 78-33 years of age. Every year if you are 17 years old or older. Eat a healthy diet  Eat a diet that includes plenty of vegetables, fruits, low-fat dairy products, and lean protein. Do not eat a lot of foods that are high in solid fats, added sugars, or sodium. Get regular exercise Get regular exercise. This is one of the most important things you can do for your health. Most adults should: Try to exercise for at least 150 minutes each week. The exercise should increase your heart rate and make you sweat (moderate-intensity exercise). Try to do strengthening exercises at least twice each week. Do these in addition to the moderate-intensity exercise. Spend less time sitting. Even light physical activity can be beneficial. Other tips Work with your health care provider to achieve or maintain a healthy weight. Do not use any products that contain nicotine or tobacco. These products include cigarettes, chewing tobacco, and vaping devices, such as e-cigarettes. If you need help quitting, ask your health care provider. Know your numbers. Ask your health care provider to check your cholesterol and your blood sugar (glucose). Continue to have your blood tested as directed by your health care provider. Do I need screening for cancer? Depending on your health history and family history, you may need to have cancer screenings at different stages of your life. This may include screening for: Breast cancer. Cervical cancer. Lung cancer. Colorectal cancer. What is my risk for  osteoporosis? After menopause, you may be at increased risk for osteoporosis. Osteoporosis is a condition in which bone destruction happens more quickly than new bone creation. To  help prevent osteoporosis or the bone fractures that can happen because of osteoporosis, you may take the following actions: If you are 16-68 years old, get at least 1,000 mg of calcium and at least 600 international units (IU) of vitamin D per day. If you are older than age 9 but younger than age 74, get at least 1,200 mg of calcium and at least 600 international units (IU) of vitamin D per day. If you are older than age 20, get at least 1,200 mg of calcium and at least 800 international units (IU) of vitamin D per day. Smoking and drinking excessive alcohol increase the risk of osteoporosis. Eat foods that are rich in calcium and vitamin D, and do weight-bearing exercises several times each week as directed by your health care provider. How does menopause affect my mental health? Depression may occur at any age, but it is more common as you become older. Common symptoms of depression include: Feeling depressed. Changes in sleep patterns. Changes in appetite or eating patterns. Feeling an overall lack of motivation or enjoyment of activities that you previously enjoyed. Frequent crying spells. Talk with your health care provider if you think that you are experiencing any of these symptoms. General instructions See your health care provider for regular wellness exams and vaccines. This may include: Scheduling regular health, dental, and eye exams. Getting and maintaining your vaccines. These include: Influenza vaccine. Get this vaccine each year before the flu season begins. Pneumonia vaccine. Shingles vaccine. Tetanus, diphtheria, and pertussis (Tdap) booster vaccine. Your health care provider may also recommend other immunizations. Tell your health care provider if you have ever been abused or do not feel safe at home. Summary Menopause is a normal process in which your ability to get pregnant comes to an end. This condition causes hot flashes, night sweats, decreased interest in sex,  mood swings, headaches, or lack of sleep. Treatment for this condition may include hormone replacement therapy. Take actions to keep yourself healthy, including exercising regularly, eating a healthy diet, watching your weight, and checking your blood pressure and blood sugar levels. Get screened for cancer and depression. Make sure that you are up to date with all your vaccines. This information is not intended to replace advice given to you by your health care provider. Make sure you discuss any questions you have with your health care provider. Document Revised: 04/21/2021 Document Reviewed: 04/21/2021 Elsevier Patient Education  2023 [employer].

## 2023-04-14 NOTE — Assessment & Plan Note (Addendum)
CBE and pap performed today. Patient will schedule mammogram. Encouraged more formal exercise program including walking.

## 2023-04-14 NOTE — Progress Notes (Signed)
Assessment & Plan:  Routine general medical examination at a health care facility Assessment & Plan: CBE and pap performed today. Patient will schedule mammogram. Encouraged more formal exercise program including walking.   Orders: -     3D Screening Mammogram, Left and Right; Future  Screening for cervical cancer -     Pap LB (liquid-based) -     HPV Aptima  Encounter for screening mammogram for malignant neoplasm of breast -     3D Screening Mammogram, Left and Right; Future  Amenorrhea -     Follicle stimulating hormone  Vitamin D deficiency -     VITAMIN D 25 Hydroxy (Vit-D Deficiency, Fractures)  Acute bilateral low back pain, unspecified whether sciatica present -     Urinalysis, Routine w reflex microscopic -     Urine Culture     Return precautions given.   Risks, benefits, and alternatives of the medications and treatment plan prescribed today were discussed, and patient expressed understanding.   Education regarding symptom management and diagnosis given to patient on AVS either electronically or printed.  No follow-ups on file.  Rennie Plowman, FNP  Subjective:    Patient ID: Floria Raveling, female    DOB: Jan 21, 1971, 52 y.o.   MRN: [id number]  CC: Danaiya D Lusignan is a 52 y.o. female who presents today for physical exam.    HPI: She would like to have urine checked today. She has dull low back 'sensation' 2 weeks ago. It is not pain.  No hematuria, rash, fever, constipation, dysuria.  H/o renal stone and she has subtle symptoms in the past     No injury.   No menses since endometrial ablation.  No hot or she questions if she is in menopause  Colorectal Cancer Screening: UTD, Dr Servando Snare 03/30/22 Breast Cancer Screening: Mammogram due 09/04/20 at [facility] Cervical Cancer Screening: due, 03/27/20, NIL, negative HPV         Tetanus - UTD       Exercise: No regular exercise.   Alcohol use:  occassional Smoking/tobacco use: Nonsmoker.    Health  Maintenance  Topic Date Due   COVID-19 Vaccine (3 - Pfizer risk series) 03/18/2020   Mammogram  09/04/2021   Pap Smear  03/28/2023   Zoster (Shingles) Vaccine (1 of 2) 05/25/2023*   Flu Shot  07/15/2023   DTaP/Tdap/Td vaccine (3 - Td or Tdap) 03/27/2030   Colon Cancer Screening  03/30/2032   Hepatitis C Screening: USPSTF Recommendation to screen - Ages 18-79 yo.  Completed   HIV Screening  Completed   HPV Vaccine  Aged Out  *Topic was postponed. The date shown is not the original due date.    ALLERGIES: Amoxicillin-pot clavulanate  Current Outpatient Medications on File Prior to Visit  Medication Sig Dispense Refill   acetaminophen (TYLENOL) 325 MG tablet Take 650 mg by mouth every 6 (six) hours as needed for moderate pain.     albuterol (VENTOLIN HFA) 108 (90 Base) MCG/ACT inhaler INHALE 2 PUFFS BY MOUTH INTO THE LUNGS EVERY 6 HOURS AS NEEDED 18 g 3   ALPRAZolam (XANAX) 0.25 MG tablet Take 1 tablet (0.25 mg total) by mouth daily as needed for anxiety. 15 tablet 0   amLODipine (NORVASC) 5 MG tablet Take one 5 mg tablet by mouth daily 90 tablet 1   aspirin-acetaminophen-caffeine (EXCEDRIN MIGRAINE) 250-250-65 MG tablet Take by mouth every 6 (six) hours as needed for headache.     fexofenadine (ALLEGRA) 180 MG tablet Take 180  mg by mouth as needed for allergies or rhinitis.     fluticasone-salmeterol (ADVAIR HFA) 115-21 MCG/ACT inhaler INHALE 2 PUFFS INTO THE LUNGS TWICE DAILY AS NEEDED 12 g 0   traZODone (DESYREL) 50 MG tablet Take 1 tablet 50 mg by mouth @ bedtime as needed for sleep 90 tablet 1   No current facility-administered medications on file prior to visit.    Review of Systems  Constitutional:  Negative for chills, fever and unexpected weight change.  HENT:  Negative for congestion.   Respiratory:  Negative for cough.   Cardiovascular:  Negative for chest pain, palpitations and leg swelling.  Gastrointestinal:  Negative for nausea and vomiting.  Genitourinary:  Negative  for difficulty urinating and dysuria.  Musculoskeletal:  Negative for arthralgias and myalgias.  Skin:  Negative for rash.  Neurological:  Negative for headaches.  Hematological:  Negative for adenopathy.  Psychiatric/Behavioral:  Negative for confusion.       Objective:    BP 118/78   Pulse 74   Temp 97.8 F (36.6 C) (Oral)   Ht 5' 6.5" (1.689 m)   Wt 162 lb 1.6 oz (73.5 kg)   SpO2 99%   BMI 25.77 kg/m   BP Readings from Last 3 Encounters:  04/14/23 118/78  12/23/22 130/80  10/15/22 122/78   Wt Readings from Last 3 Encounters:  04/14/23 162 lb 1.6 oz (73.5 kg)  12/23/22 159 lb 3.2 oz (72.2 kg)  10/15/22 160 lb (72.6 kg)    Physical Exam Vitals reviewed.  Constitutional:      Appearance: Normal appearance. She is well-developed.  Eyes:     Conjunctiva/sclera: Conjunctivae normal.  Neck:     Thyroid: No thyroid mass or thyromegaly.  Cardiovascular:     Rate and Rhythm: Normal rate and regular rhythm.     Pulses: Normal pulses.     Heart sounds: Normal heart sounds.  Pulmonary:     Effort: Pulmonary effort is normal.     Breath sounds: Normal breath sounds. No wheezing, rhonchi or rales.  Chest:  Breasts:    Breasts are symmetrical.     Right: No inverted nipple, mass, nipple discharge, skin change or tenderness.     Left: No inverted nipple, mass, nipple discharge, skin change or tenderness.  Abdominal:     General: Bowel sounds are normal. There is no distension.     Palpations: Abdomen is soft. Abdomen is not rigid. There is no fluid wave or mass.     Tenderness: There is no abdominal tenderness. There is no guarding or rebound.  Genitourinary:    Cervix: No cervical motion tenderness, discharge or friability.     Uterus: Not enlarged, not fixed and not tender.      Adnexa:        Right: No mass, tenderness or fullness.         Left: No mass, tenderness or fullness.       Comments: Pap performed. No CMT. Unable to appreciated ovaries. Lymphadenopathy:      Head:     Right side of head: No submental, submandibular, tonsillar, preauricular, posterior auricular or occipital adenopathy.     Left side of head: No submental, submandibular, tonsillar, preauricular, posterior auricular or occipital adenopathy.     Cervical:     Right cervical: No superficial, deep or posterior cervical adenopathy.    Left cervical: No superficial, deep or posterior cervical adenopathy.     Upper Body:     Right upper body: No  pectoral adenopathy.     Left upper body: No pectoral adenopathy.  Skin:    General: Skin is warm and dry.  Neurological:     Mental Status: She is alert.  Psychiatric:        Speech: Speech normal.        Behavior: Behavior normal.        Thought Content: Thought content normal.

## 2023-04-15 LAB — URINALYSIS, ROUTINE W REFLEX MICROSCOPIC
Bilirubin, UA: NEGATIVE
Glucose, UA: NEGATIVE
Ketones, UA: NEGATIVE
Nitrite, UA: NEGATIVE
Protein,UA: NEGATIVE
RBC, UA: NEGATIVE
Specific Gravity, UA: 1.022 (ref 1.005–1.030)
Urobilinogen, Ur: 0.2 mg/dL (ref 0.2–1.0)
pH, UA: 6 (ref 5.0–7.5)

## 2023-04-15 LAB — MICROSCOPIC EXAMINATION: Casts: NONE SEEN /lpf

## 2023-04-15 LAB — VITAMIN D 25 HYDROXY (VIT D DEFICIENCY, FRACTURES): Vit D, 25-Hydroxy: 27.2 ng/mL — ABNORMAL LOW (ref 30.0–100.0)

## 2023-04-15 LAB — FOLLICLE STIMULATING HORMONE: FSH: 69.6 m[IU]/mL

## 2023-04-16 ENCOUNTER — Other Ambulatory Visit: Payer: Self-pay | Admitting: Family

## 2023-04-16 DIAGNOSIS — R319 Hematuria, unspecified: Secondary | ICD-10-CM

## 2023-04-16 LAB — URINE CULTURE

## 2023-04-19 LAB — PAP LB (LIQUID-BASED)

## 2023-04-19 LAB — HPV APTIMA: HPV Aptima: NEGATIVE

## 2023-06-23 ENCOUNTER — Encounter: Payer: Self-pay | Admitting: Family

## 2023-08-26 ENCOUNTER — Other Ambulatory Visit: Payer: Self-pay | Admitting: Family

## 2023-08-26 DIAGNOSIS — G4700 Insomnia, unspecified: Secondary | ICD-10-CM

## 2023-08-26 DIAGNOSIS — I10 Essential (primary) hypertension: Secondary | ICD-10-CM

## 2023-09-21 ENCOUNTER — Ambulatory Visit: Payer: Managed Care, Other (non HMO) | Admitting: Dermatology

## 2023-09-21 ENCOUNTER — Encounter: Payer: Self-pay | Admitting: Dermatology

## 2023-09-21 VITALS — BP 131/80 | HR 61

## 2023-09-21 DIAGNOSIS — Z1283 Encounter for screening for malignant neoplasm of skin: Secondary | ICD-10-CM

## 2023-09-21 DIAGNOSIS — W908XXA Exposure to other nonionizing radiation, initial encounter: Secondary | ICD-10-CM

## 2023-09-21 DIAGNOSIS — L814 Other melanin hyperpigmentation: Secondary | ICD-10-CM

## 2023-09-21 DIAGNOSIS — L821 Other seborrheic keratosis: Secondary | ICD-10-CM

## 2023-09-21 DIAGNOSIS — Z872 Personal history of diseases of the skin and subcutaneous tissue: Secondary | ICD-10-CM

## 2023-09-21 DIAGNOSIS — L820 Inflamed seborrheic keratosis: Secondary | ICD-10-CM

## 2023-09-21 DIAGNOSIS — D1801 Hemangioma of skin and subcutaneous tissue: Secondary | ICD-10-CM

## 2023-09-21 DIAGNOSIS — D225 Melanocytic nevi of trunk: Secondary | ICD-10-CM

## 2023-09-21 DIAGNOSIS — D2272 Melanocytic nevi of left lower limb, including hip: Secondary | ICD-10-CM

## 2023-09-21 DIAGNOSIS — L578 Other skin changes due to chronic exposure to nonionizing radiation: Secondary | ICD-10-CM | POA: Diagnosis not present

## 2023-09-21 DIAGNOSIS — D229 Melanocytic nevi, unspecified: Secondary | ICD-10-CM

## 2023-09-21 DIAGNOSIS — D2271 Melanocytic nevi of right lower limb, including hip: Secondary | ICD-10-CM

## 2023-09-21 NOTE — Progress Notes (Signed)
Follow-Up Visit   Subjective  Briana Klein is a 52 y.o. female who presents for the following: Skin Cancer Screening and Full Body Skin Exam  The patient presents for Total-Body Skin Exam (TBSE) for skin cancer screening and mole check. The patient has spots, moles and lesions to be evaluated, some may be new or changing. No history of skin cancer or abnormal moles. Hx AK of the glabella. Itchy spot on back, rubbed by bra.   The following portions of the chart were reviewed this encounter and updated as appropriate: medications, allergies, medical history  Review of Systems:  No other skin or systemic complaints except as noted in HPI or Assessment and Plan.  Objective  Well appearing patient in no apparent distress; mood and affect are within normal limits.  A full examination was performed including scalp, head, eyes, ears, nose, lips, neck, chest, axillae, abdomen, back, buttocks, bilateral upper extremities, bilateral lower extremities, hands, feet, fingers, toes, fingernails, and toenails. All findings within normal limits unless otherwise noted below.   Relevant physical exam findings are noted in the Assessment and Plan.  Left Mid Back Erythematous stuck-on, waxy papule or plaque    Assessment & Plan   SKIN CANCER SCREENING PERFORMED TODAY.  ACTINIC DAMAGE - Chronic condition, secondary to cumulative UV/sun exposure - diffuse scaly erythematous macules with underlying dyspigmentation - Recommend daily broad spectrum sunscreen SPF 30+ to sun-exposed areas, reapply every 2 hours as needed.  - Staying in the shade or wearing long sleeves, sun glasses (UVA+UVB protection) and wide brim hats (4-inch brim around the entire circumference of the hat) are also recommended for sun protection.  - Call for new or changing lesions.  LENTIGINES, SEBORRHEIC KERATOSES, HEMANGIOMAS - Benign normal skin lesions - Benign-appearing - Call for any changes  MELANOCYTIC NEVI - Tan-brown  and/or pink-flesh-colored symmetric macules and papules - left flank 6 x 5 mm speckled brown macule    - right mid abdomen 1.3 x 0.8 cm reg brown patch    - right calf 4 mm med brown macule    - left medial ankle 6 mm med dark brown macule    - Right posterior flank 8 x 5 mm brown macule with darker edge   - right lateral upper knee/ant thigh 4mm med-dark brown macule   - left mid lower back 1.2 x 0.6 cm brown macule  - Benign appearing on exam today. Photos compared from 09/04/2020, stable. - Observation - Call clinic for new or changing moles - Recommend daily use of broad spectrum spf 30+ sunscreen to sun-exposed areas.   HISTORY OF PRECANCEROUS ACTINIC KERATOSIS - site(s) of PreCancerous Actinic Keratosis clear today. - these may recur and new lesions may form requiring treatment to prevent transformation into skin cancer - observe for new or changing spots and contact [facility] for appointment if occur - photoprotection with sun protective clothing; sunglasses and broad spectrum sunscreen with SPF of at least 30 + and frequent self skin exams recommended - yearly exams by a dermatologist recommended for persons with history of PreCancerous Actinic Keratoses  Inflamed seborrheic keratosis Left Mid Back  Symptomatic, irritating, patient would like treated.  Destruction of lesion - Left Mid Back  Destruction method: cryotherapy   Informed consent: discussed and consent obtained   Lesion destroyed using liquid nitrogen: Yes   Region frozen until ice ball extended beyond lesion: Yes   Outcome: patient tolerated procedure well with no complications   Post-procedure details: wound care  instructions given   Additional details:  Prior to procedure, discussed risks of blister formation, small wound, skin dyspigmentation, or rare scar following cryotherapy. Recommend Vaseline ointment to treated areas while healing.    Return in about 1 year (around 09/20/2024)  for TBSE.  ICherlyn Labella, CMA, am acting as scribe for Willeen Niece, MD .   Documentation: I have reviewed the above documentation for accuracy and completeness, and I agree with the above.  Willeen Niece, MD

## 2023-09-21 NOTE — Patient Instructions (Signed)

## 2023-10-24 IMAGING — US US ABDOMEN LIMITED
1 series · 14 of 25 positions shown · non-contrast
Comparison: None, no prior abdominal CT available.

CLINICAL DATA: 50-year-old female with abdominal pain. "Liver
lesion seen on CT Abdomen and Pelvis 02/12/2022".

EXAM:
ULTRASOUND ABDOMEN LIMITED RIGHT UPPER QUADRANT

[Series 1: us abdomen limited · 0.23mm/px · 14 of 62 slices shown]
[im 1/62]
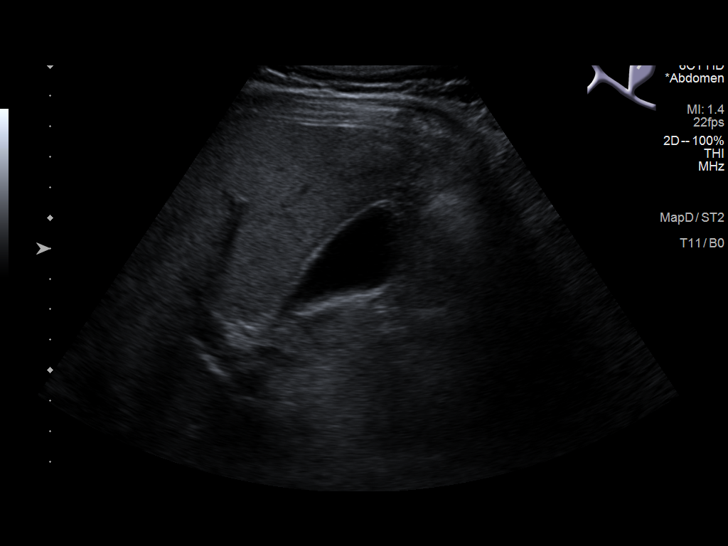
[im 6/62]
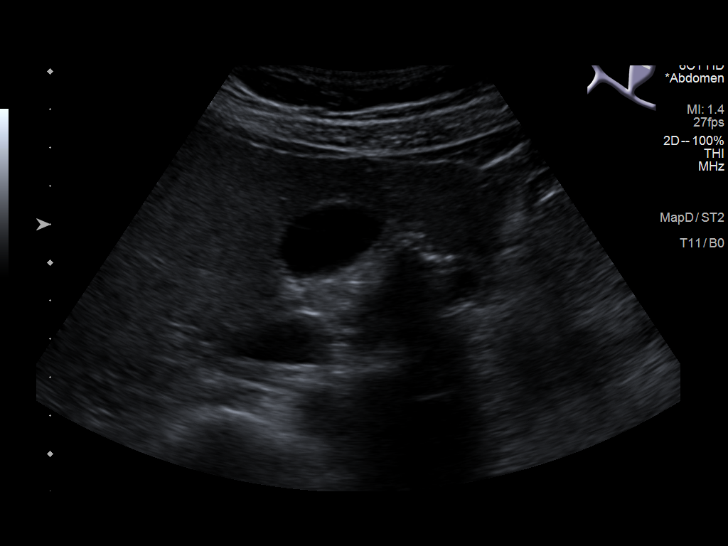
[im 11/62]
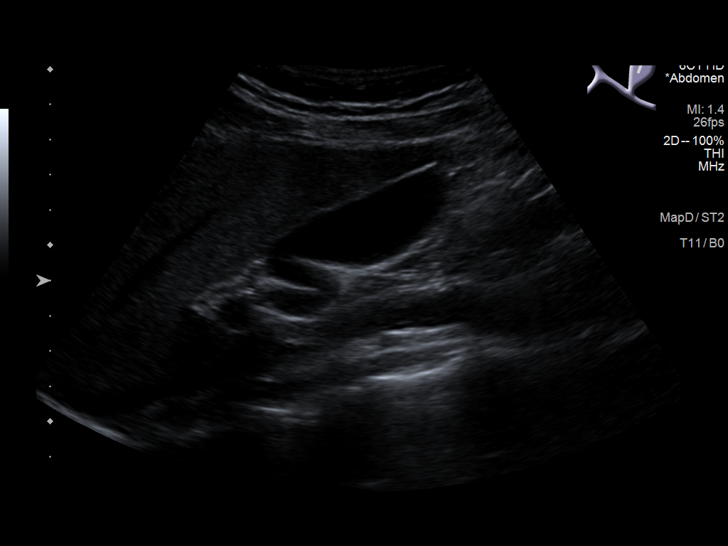
[im 16/62]
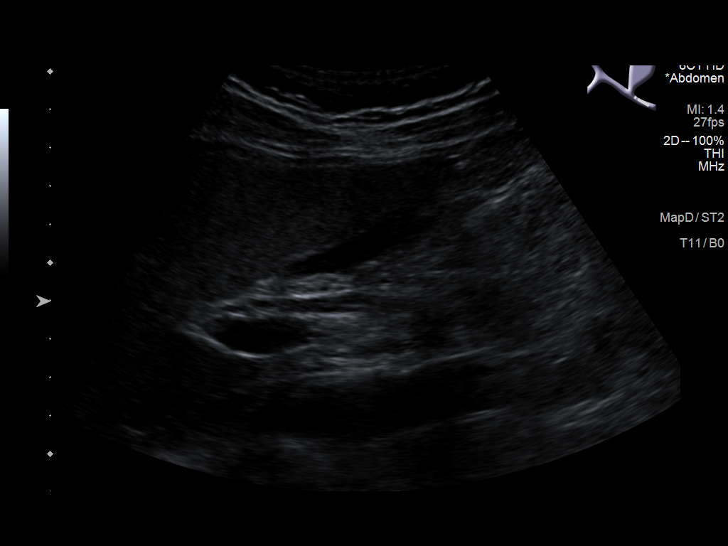
[im 21/62]
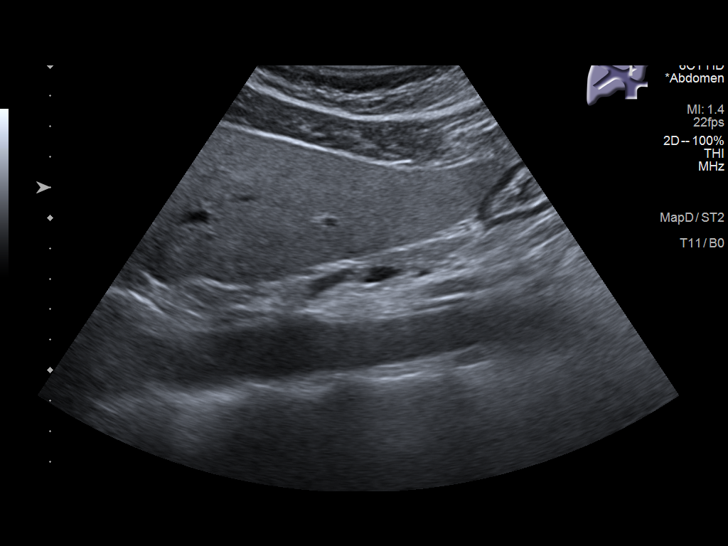
[im 23/62]
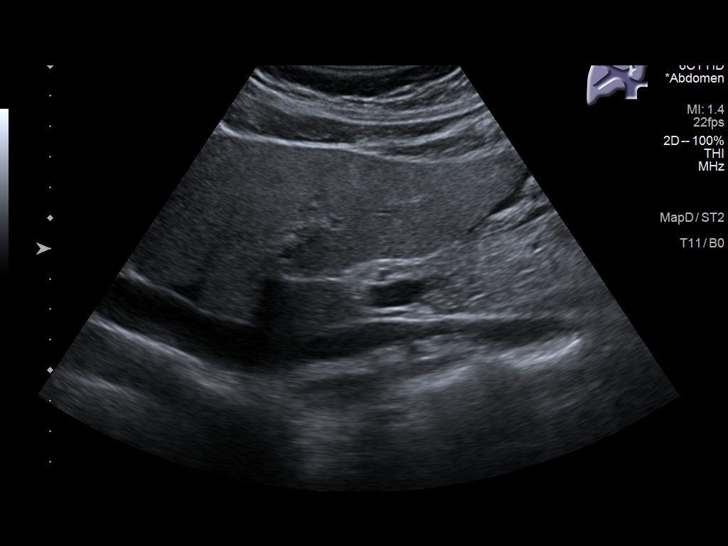
[im 28/62]
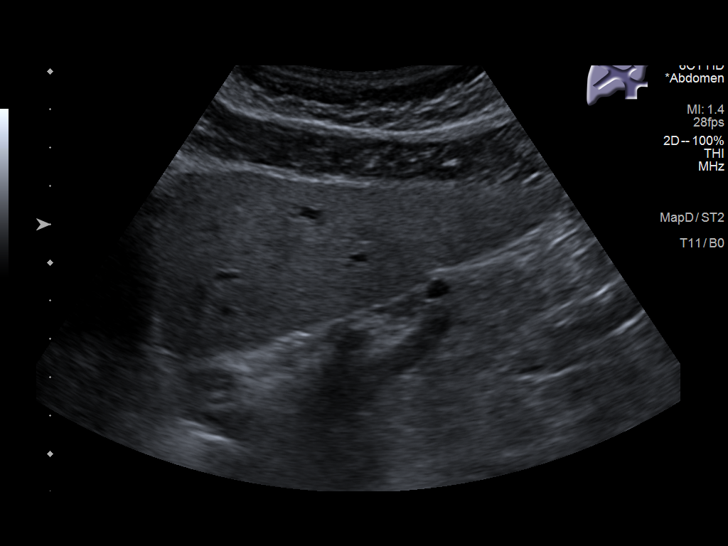
[im 34/62]
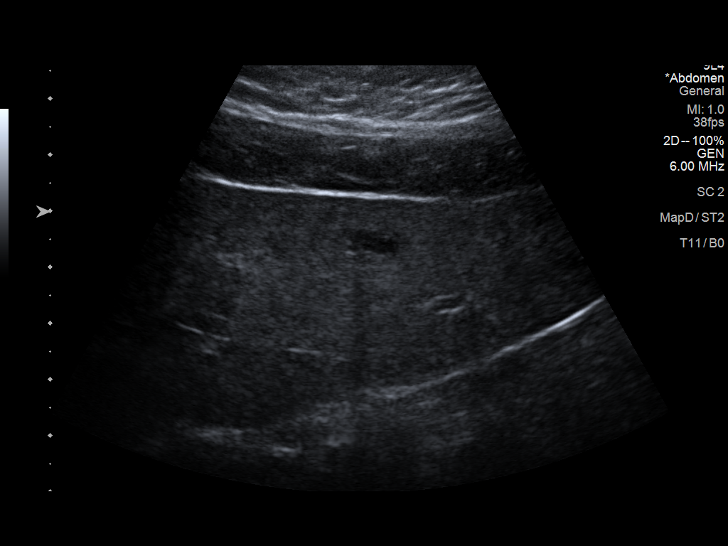
[im 39/62]
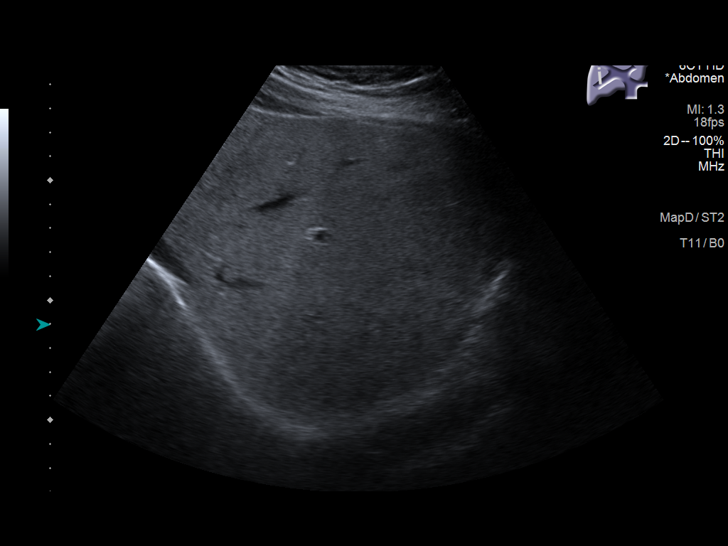
[im 41/62]
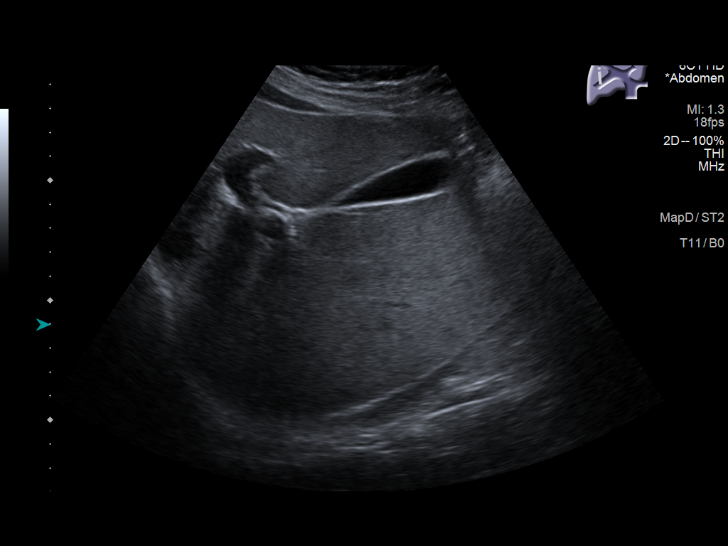
[im 46/62]
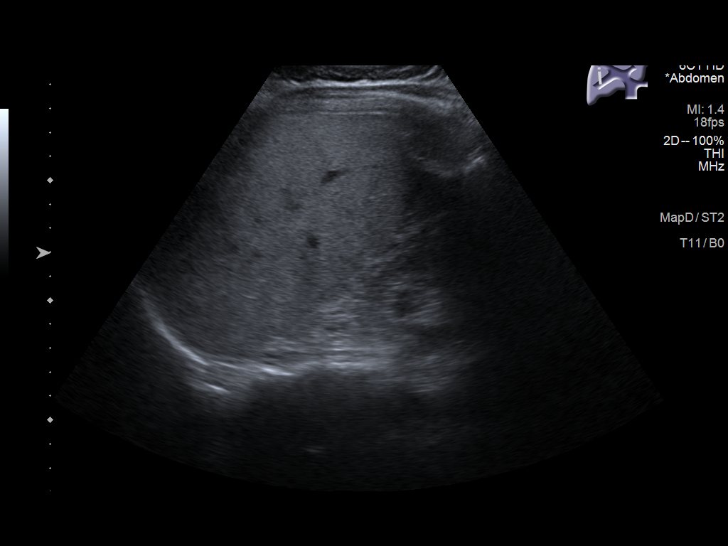
[im 51/62]
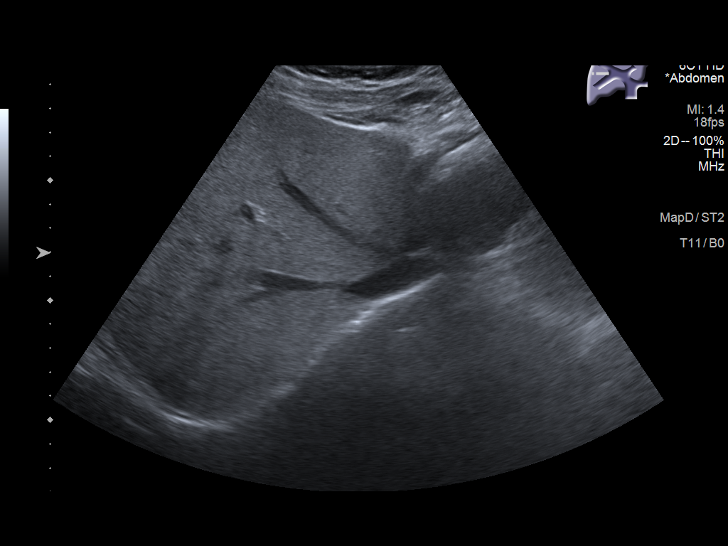
[im 56/62]
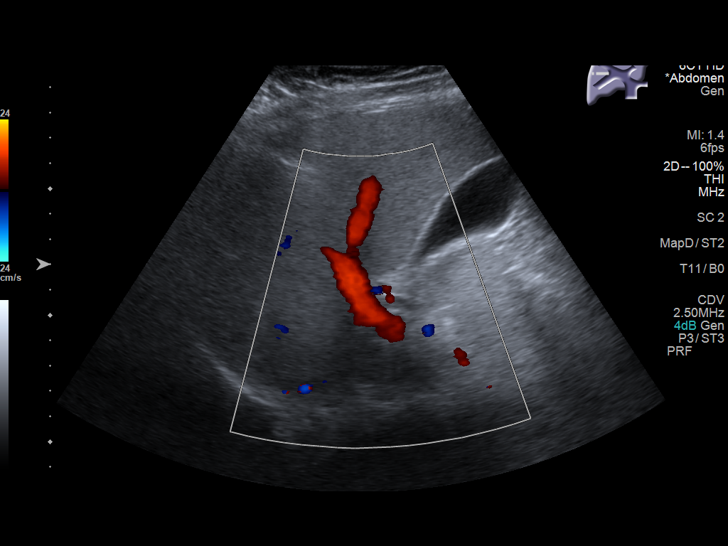
[im 62/62]
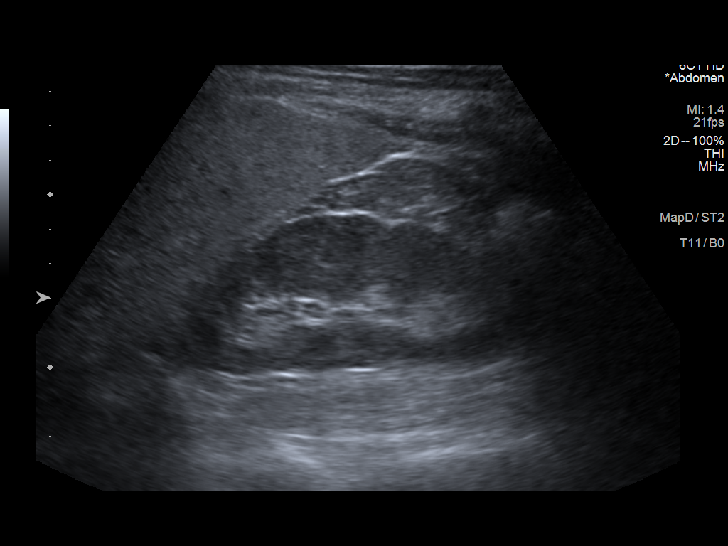

[14 of 25 positions shown; findings below may reference images not displayed]

FINDINGS: Gallbladder:

No gallstones or wall thickening visualized. No sonographic Murphy
sign noted by sonographer.

Common bile duct:

Diameter: 4-5 mm, normal.

Liver:

Echogenic liver (images 48, 63). Solitary small oval 7 x 8 mm lesion
in the left hepatic lobe (image 37) with increased through
transmission and no vascular elements on brief Doppler interrogation
(image 33) is compatible with a simple cyst. No other discrete liver
lesion. No intrahepatic biliary ductal dilatation. Portal vein is
patent on color Doppler imaging with normal direction of blood flow
towards the liver.

Other: Negative visible right kidney.  No free fluid.
IMPRESSION: 1. Evidence of hepatic steatosis.
2. Small benign 8 mm cyst in the left hepatic lobe. No other liver
lesion by ultrasound.
3. Normal gallbladder and bile ducts.

## 2023-11-15 IMAGING — CT CT ABD-PELV W/ CM
2 of 5 series · 16 of 46 positions shown, 18 images · IV contrast (agent unspecified)
Comparison: Outside priorCT abdomen and pelvis dated February 12, 2022

CLINICAL DATA: concern for appendix mass

EXAM:
CT ABDOMEN AND PELVIS WITH CONTRAST
TECHNIQUE: Multidetector CT imaging of the abdomen and pelvis was performed
using the standard protocol following bolus administration of
intravenous contrast.

[Series 2: abd pelvis 5.00 · axial · 0.74mm/px · z∈[-1554,-1104]mm · 13 of 102 slices shown, 15 images]
[im 6/102  soft-tissue]
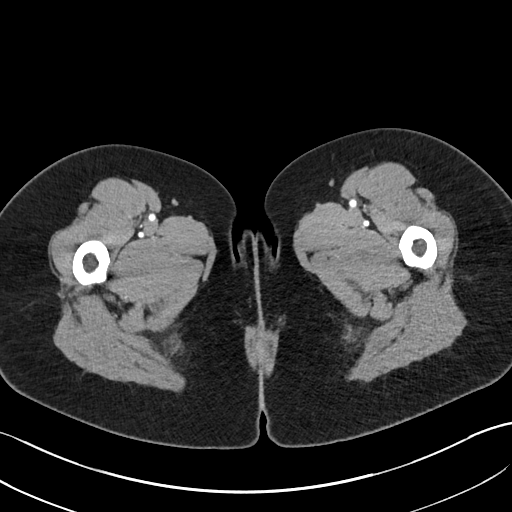
[im 6/102  bone]
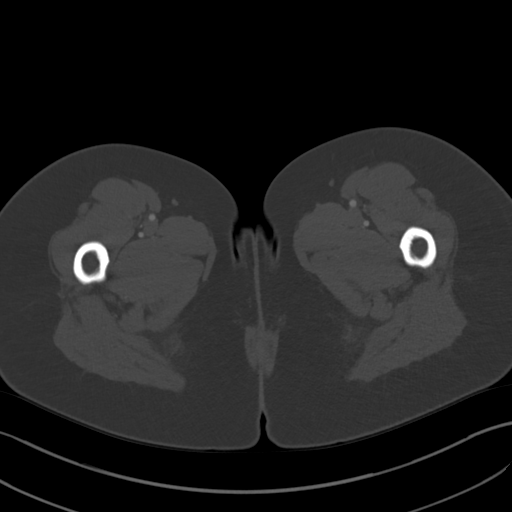
[im 16/102  soft-tissue]
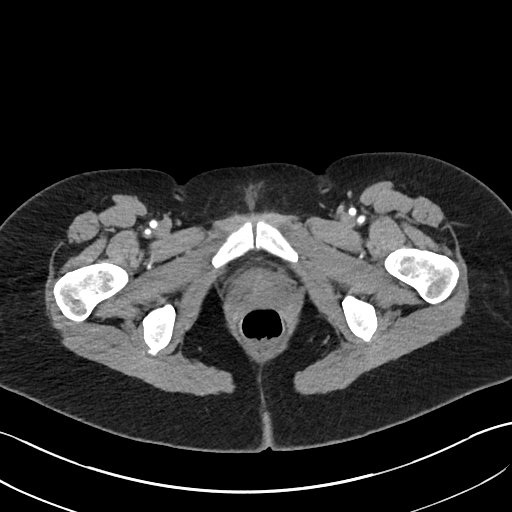
[im 22/102  soft-tissue]
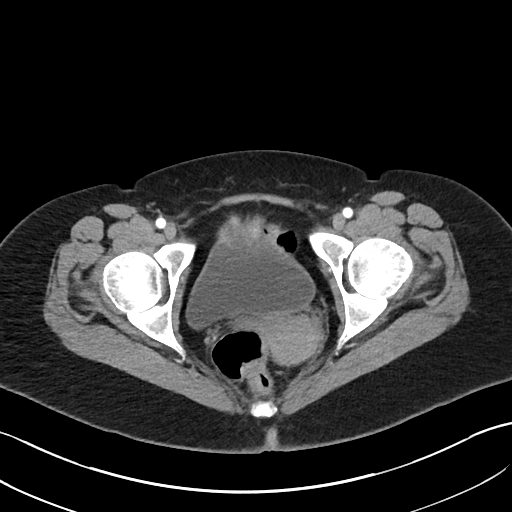
[im 27/102  soft-tissue]
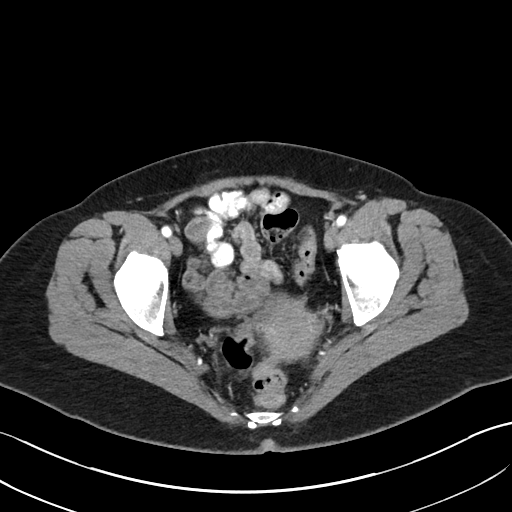
[im 38/102  soft-tissue]
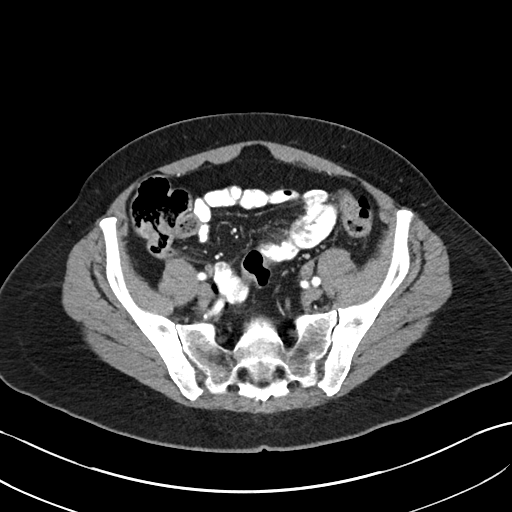
[im 43/102  soft-tissue]
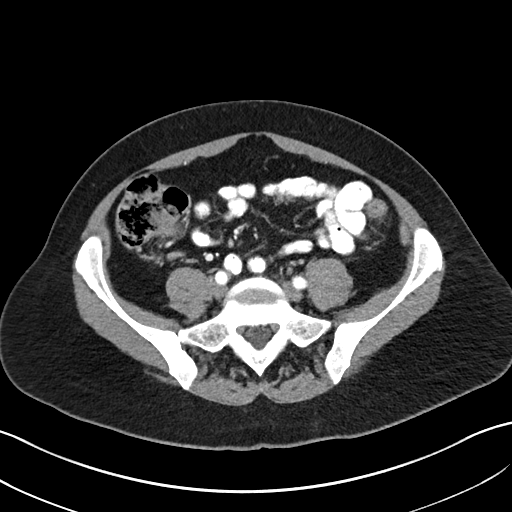
[im 54/102  soft-tissue]
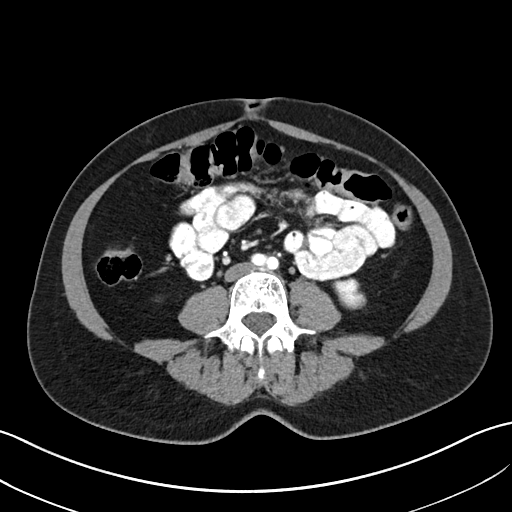
[im 59/102  soft-tissue]
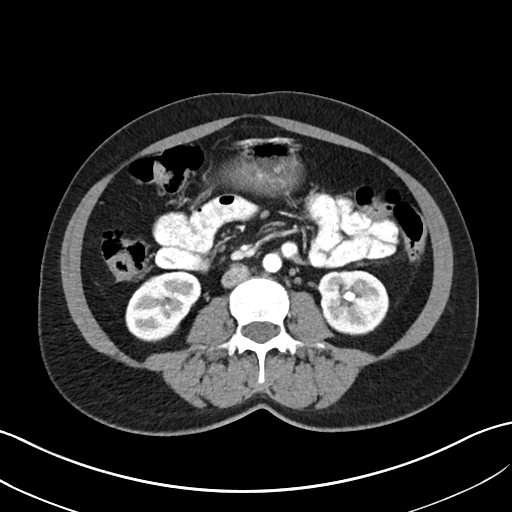
[im 64/102  soft-tissue]
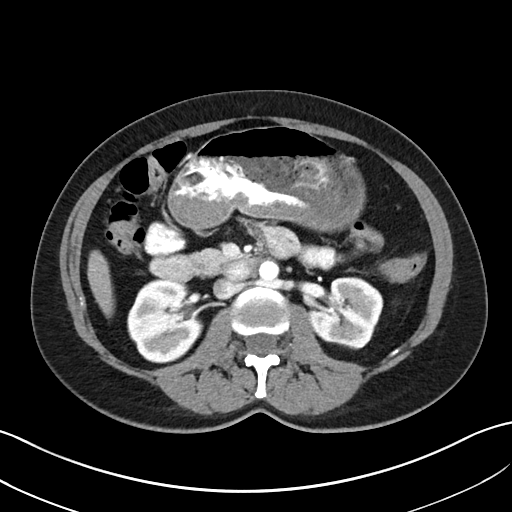
[im 64/102  bone]
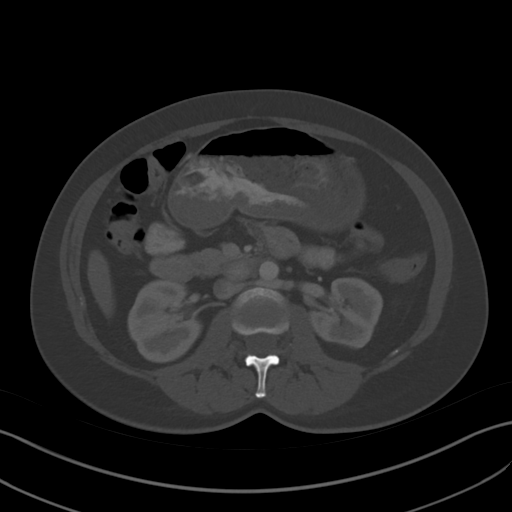
[im 75/102  soft-tissue]
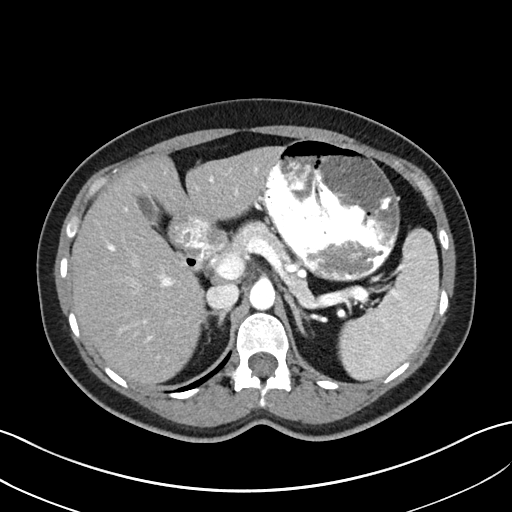
[im 80/102  soft-tissue]
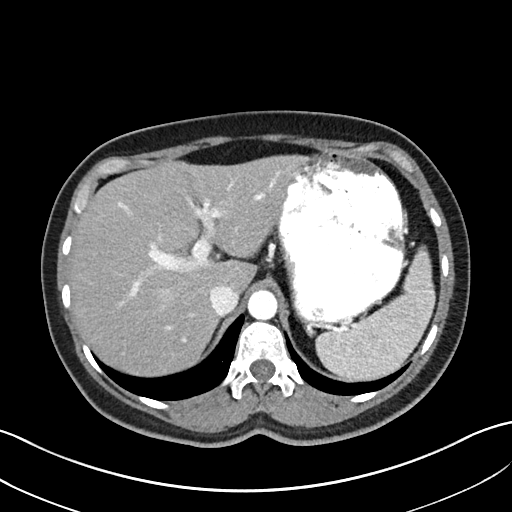
[im 86/102  soft-tissue]
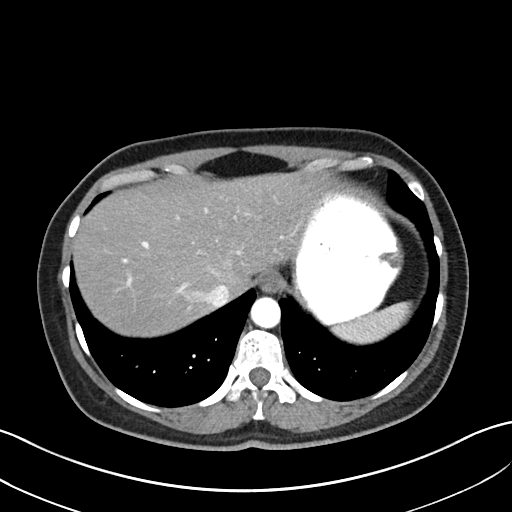
[im 96/102  soft-tissue]
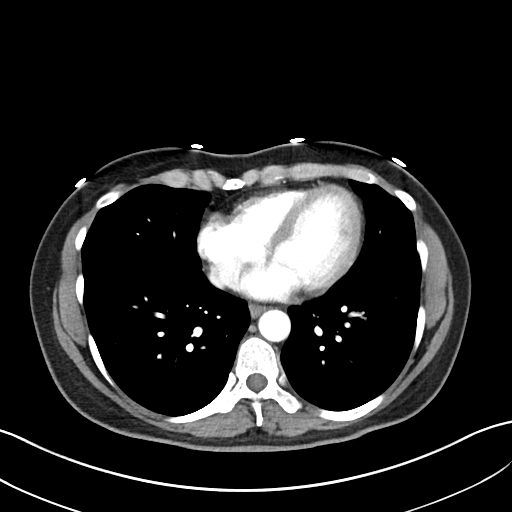

[Series 4: coronals abd pelvis 2.00 cor · coronal · 0.74mm/px · 3 of 136 slices shown]
[im 46/136  soft-tissue]
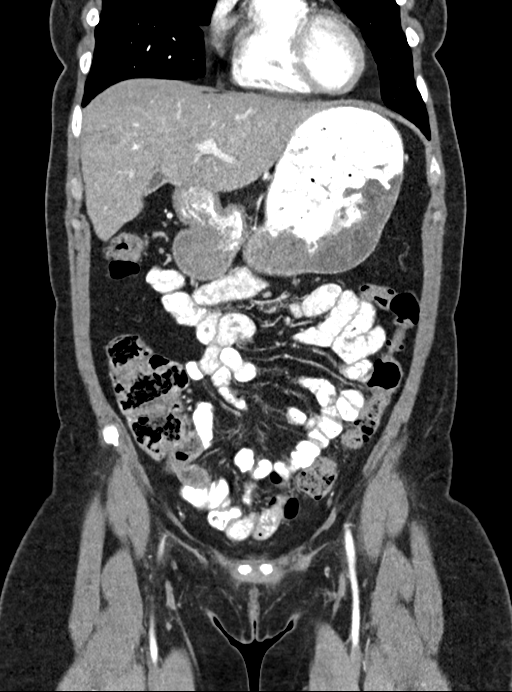
[im 61/136  soft-tissue]
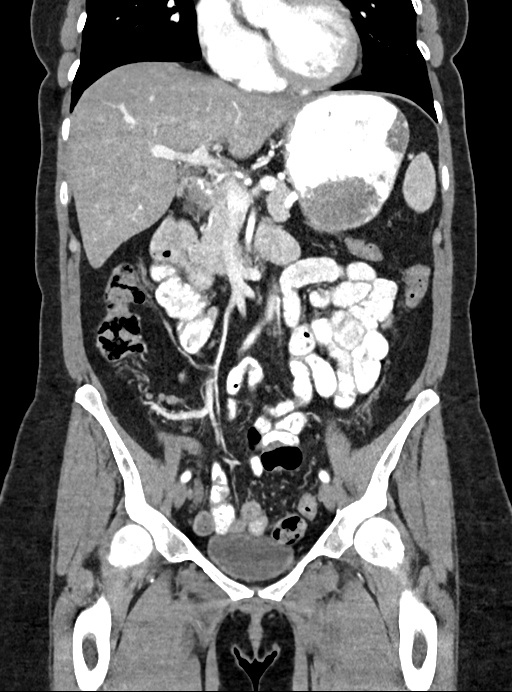
[im 76/136  soft-tissue]
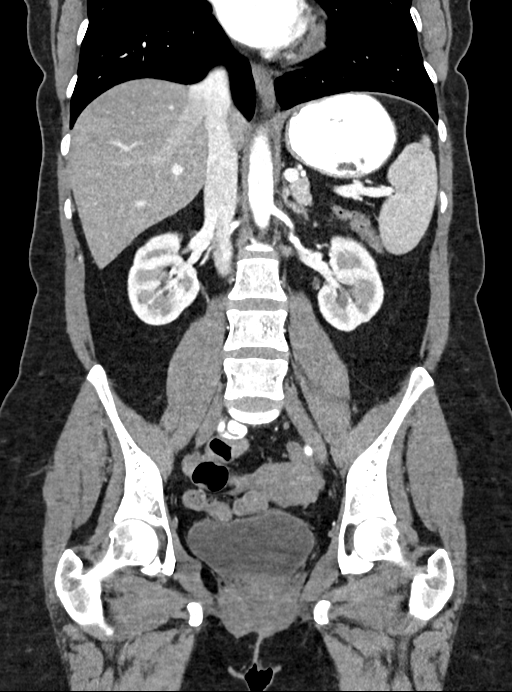

[16 of 46 positions shown; findings below may reference images not displayed]

RADIATION DOSE REDUCTION: This exam was performed according to the
departmental dose-optimization program which includes automated
exposure control, adjustment of the mA and/or kV according to
patient size and/or use of iterative reconstruction technique.

CONTRAST:  100mL OMNIPAQUE IOHEXOL 300 MG/ML  SOLN
FINDINGS: Lower chest: No acute abnormality.

Hepatobiliary: No focal liver abnormality is seen. No gallstones,
gallbladder wall thickening, or biliary dilatation.

Pancreas: Unremarkable. No pancreatic ductal dilatation or
surrounding inflammatory changes.

Spleen: Normal in size without focal abnormality.

Adrenals/Urinary Tract: The bilateral adrenal glands are
unremarkable. No hydronephrosis. Nonobstructing stones of the upper
and lower poles of the left kidney, unchanged when compared with
prior exam. Bladder is unremarkable.

Stomach/Bowel: Appendix demonstrates decreased wall thickening and
periappendiceal fat stranding when compared with prior exam. Stomach
is within normal limits. No evidence of bowel wall thickening,
distention, or inflammatory changes.

Vascular/Lymphatic: No significant vascular findings are present. No
enlarged abdominal or pelvic lymph nodes.

Reproductive: Uterus and bilateral adnexa are unremarkable.

Other: No abdominal wall hernia or abnormality. No abdominopelvic
ascites.

Musculoskeletal: No acute or significant osseous findings.
IMPRESSION: 1. Appendix demonstrates decreased wall thickening when compared
with February 12, 2022 prior exam, findings are likely due to resolved
acute appendicitis.
2. No evidence of appendiceal mass.
3. Nonobstructing left renal stones.

## 2024-01-26 IMAGING — CR DG ABDOMEN 1V
2 series · 2 of 2 positions shown · non-contrast
Comparison: CT abdomen and pelvis 03/17/2022

CLINICAL DATA: Kidney stones. Left flank/pelvic pain. Microscopic
hematuria.

EXAM:
ABDOMEN - 1 VIEW

[abdomen kub (1 of 2)]
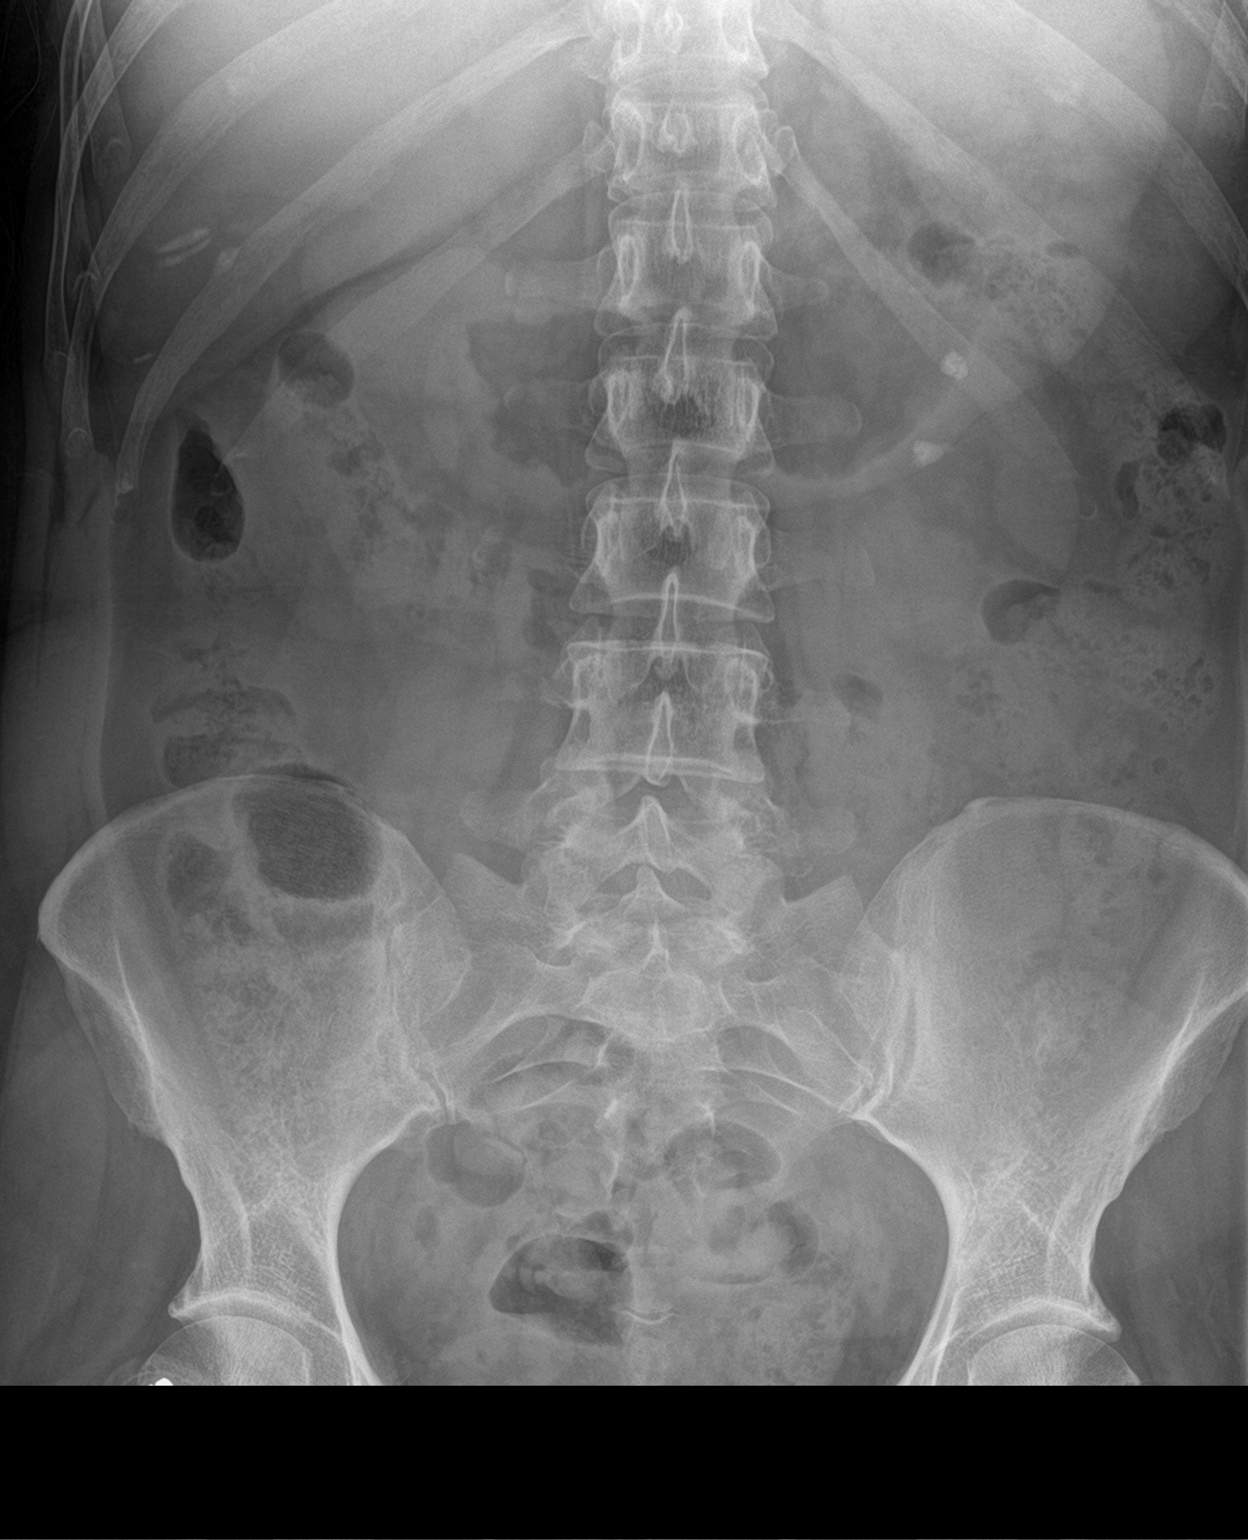

[abdomen kub (2 of 2)]
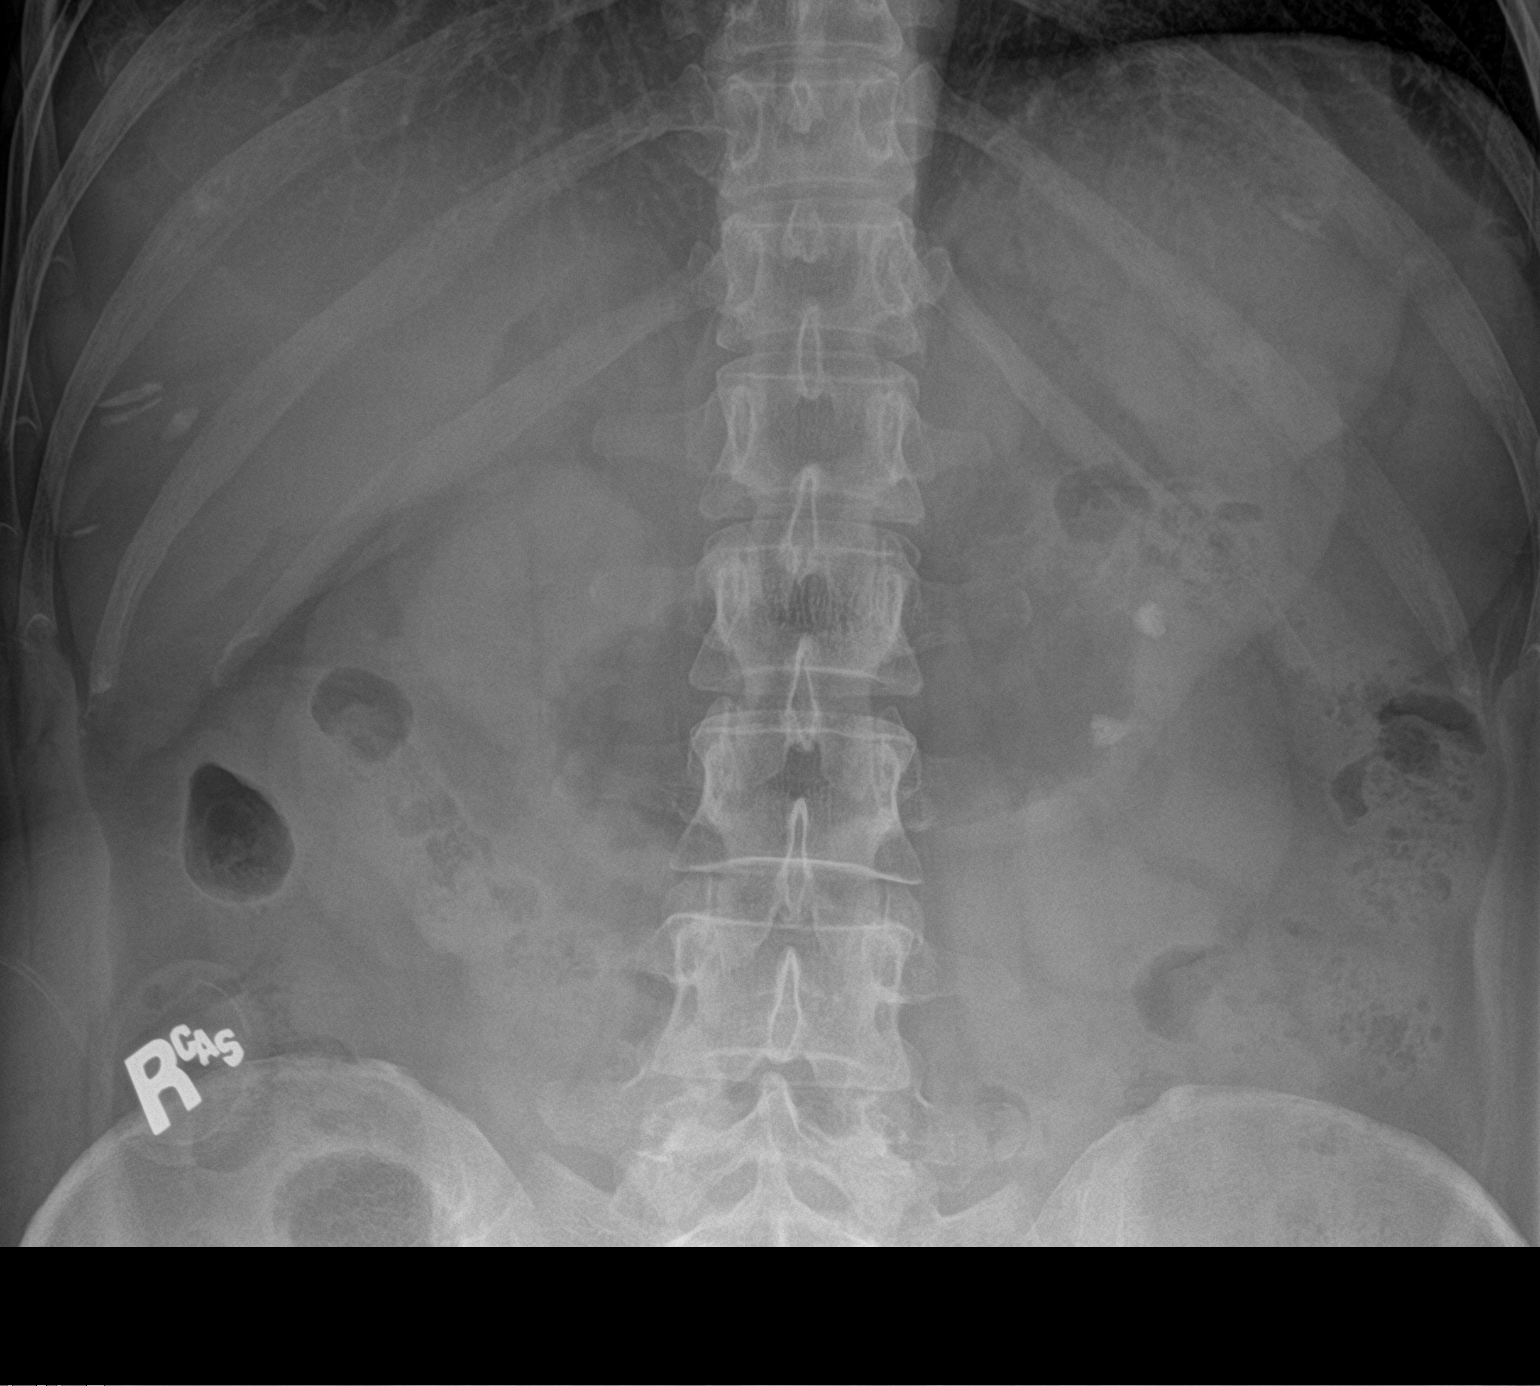

[2 of 2 positions shown; findings below may reference images not displayed]

FINDINGS: Two calculi projecting over the mid and lower aspects of the left
kidney measure approximately 8 mm each, unchanged from the prior CT.
No definite right renal calculi or ureteral calculi are identified.
A moderate amount of stool is noted in the colon. No dilated loops
of bowel are seen to suggest obstruction. No acute osseous
abnormality is identified.
IMPRESSION: Unchanged left renal calculi.

## 2024-02-11 ENCOUNTER — Telehealth: Payer: Managed Care, Other (non HMO) | Admitting: Family

## 2024-02-11 ENCOUNTER — Encounter: Payer: Self-pay | Admitting: Family

## 2024-02-11 DIAGNOSIS — F419 Anxiety disorder, unspecified: Secondary | ICD-10-CM | POA: Diagnosis not present

## 2024-02-11 MED ORDER — CLONAZEPAM 0.5 MG PO TABS: 0.5000 mg | ORAL_TABLET | Freq: Every evening | ORAL | 2 refills | Status: AC

## 2024-02-11 MED ORDER — FLUOXETINE HCL 20 MG PO CAPS: 20.0000 mg | ORAL_CAPSULE | Freq: Every morning | ORAL | 3 refills | Status: DC

## 2024-02-11 MED ORDER — BUSPIRONE HCL 5 MG PO TABS: 5.0000 mg | ORAL_TABLET | Freq: Two times a day (BID) | ORAL | 3 refills | Status: DC

## 2024-02-11 NOTE — Patient Instructions (Addendum)
 Wean off of trazodone 50mg  by taking every other night for 3 nights, then stop  Start prozac 20mg  every morning  After 2-3 weeks, if feeling better but not quite where you wantto feel, you can increase to prozac 40mg  in the morning.   Meanwhile, start buspar 5mg  twice daily.  Trial stop xanax.   Instead start klonopin   No alcohol use with klonopin or xanax.  Please let me know how

## 2024-02-11 NOTE — Assessment & Plan Note (Addendum)
 Uncontrolled.  Start Prozac 20 mg and titrate.  Start BuSpar 5 mg gram twice daily and titrate.  Change from Xanax to Klonopin 0.5 mg to see if more effective for sleeping, anxiety.  Plan to wean off of trazodone for now.  Close follow-up.  Patient will look into counseling through employer

## 2024-02-11 NOTE — Progress Notes (Signed)
 Virtual Visit via Video Note  I connected with Briana Klein on 02/11/24 at  8:30 AM EST by a video enabled telemedicine application and verified that I am speaking with the correct person using two identifiers. Location patient: home Location provider: work  Persons participating in the virtual visit: patient, provider  I discussed the limitations of evaluation and management by telemedicine and the availability of in person appointments. The patient expressed understanding and agreed to proceed.  HPI: She complains of increased anxiety after taking new position with employer over the past couple of weeks.  This week has been 'slightly' better as she is adjusting to new role.   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   She takes xanax 0.25mg  and trazodone 50mg  qpm. She tried trazodone 75-100mg  without relief.   Trouble falling and staying asleep. Endorses tearfulness, nausea and loss of appetite.   Denies SI/HI.   She is working for clinical trials, working remote.  Works for [employer]  She has tried buspar,prozac,zoloft   She has been on bystolic in the past.   H/o asthma  ROS: See pertinent positives and negatives per HPI.  EXAM:  VITALS per patient if applicable: There were no vitals taken for this visit. BP Readings from Last 3 Encounters:  09/21/23 131/80  04/14/23 118/78  12/23/22 130/80   Wt Readings from Last 3 Encounters:  04/14/23 162 lb 1.6 oz (73.5 kg)  12/23/22 159 lb 3.2 oz (72.2 kg)  10/15/22 160 lb (72.6 kg)      02/11/2024    8:22 AM 04/14/2023    3:12 PM 12/23/2022   11:46 AM  Depression screen PHQ 2/9  Decreased Interest 2 0 0  Down, Depressed, Hopeless 0 0 0  PHQ - 2 Score 2 0 0  Altered sleeping 3    Tired, decreased energy 3    Change in appetite 2    Feeling bad or failure about yourself  2    Trouble concentrating 2    Moving slowly or fidgety/restless 2    Suicidal thoughts 0    PHQ-9 Score 16    Difficult  doing work/chores Very difficult       GENERAL: alert, oriented, appears well and in no acute distress  HEENT: atraumatic, conjunttiva clear, no obvious abnormalities on inspection of external nose and ears  NECK: normal movements of the head and neck  LUNGS: on inspection no signs of respiratory distress, breathing rate appears normal, no obvious gross SOB, gasping or wheezing  CV: no obvious cyanosis  MS: moves all visible extremities without noticeable abnormality  PSYCH/NEURO: pleasant and cooperative, no obvious depression or anxiety, speech and thought processing grossly intact  ASSESSMENT AND PLAN: Anxiety Assessment & Plan: Uncontrolled.  Start Prozac 20 mg and titrate.  Start BuSpar 5 mg gram twice daily and titrate.  Change from Xanax to Klonopin 0.5 mg to see if more effective for sleeping, anxiety.  Plan to wean off of trazodone for now.  Close follow-up.  Patient will look into counseling through employer  Orders: -     FLUoxetine HCl; Take 1 capsule (20 mg total) by mouth every morning.  Dispense: 90 capsule; Refill: 3 -     busPIRone HCl; Take 1 tablet (5 mg total) by mouth 2 (two) times daily.  Dispense: 180 tablet; Refill: 3  Other orders -     clonazePAM; Take 1 tablet (0.5 mg total) by mouth every evening.  Dispense: 30 tablet; Refill: 2     -  we discussed possible serious and likely etiologies, options for evaluation and workup, limitations of telemedicine visit vs in person visit, treatment, treatment risks and precautions. Pt prefers to treat via telemedicine empirically rather then risking or undertaking an in person visit at this moment.    I discussed the assessment and treatment plan with the patient. The patient was provided an opportunity to ask questions and all were answered. The patient agreed with the plan and demonstrated an understanding of the instructions.   The patient was advised to call back or seek an in-person evaluation if the symptoms  worsen or if the condition fails to improve as anticipated.  Advised if desired AVS can be mailed or viewed via MyChart if Mychart user.   Rennie Plowman, FNP

## 2024-02-22 ENCOUNTER — Encounter: Payer: Self-pay | Admitting: Family

## 2024-02-22 ENCOUNTER — Telehealth: Payer: Self-pay | Admitting: Family

## 2024-02-22 NOTE — Telephone Encounter (Signed)
 April 11th appt with Arnett needs to be reschedule to next available slot/provider off. Left message to patient to call and reschedule appointment.

## 2024-02-25 ENCOUNTER — Encounter: Payer: Self-pay | Admitting: Family

## 2024-02-28 ENCOUNTER — Other Ambulatory Visit: Payer: Self-pay | Admitting: Family

## 2024-02-28 DIAGNOSIS — G4700 Insomnia, unspecified: Secondary | ICD-10-CM

## 2024-02-28 DIAGNOSIS — I10 Essential (primary) hypertension: Secondary | ICD-10-CM

## 2024-03-02 NOTE — Telephone Encounter (Signed)
 Faxed to [facility] on 03/02/2024

## 2024-03-03 ENCOUNTER — Other Ambulatory Visit: Payer: Self-pay | Admitting: Family

## 2024-03-03 DIAGNOSIS — I10 Essential (primary) hypertension: Secondary | ICD-10-CM

## 2024-03-03 MED ORDER — AMLODIPINE BESYLATE 5 MG PO TABS: ORAL_TABLET | ORAL | 0 refills | Status: DC

## 2024-03-03 NOTE — Telephone Encounter (Signed)
 Copied from CRM [id number]. Topic: Clinical - Medication Refill >> Mar 03, 2024  4:06 PM Adaysia C wrote: Most Recent Primary Care Visit:  Provider: Allegra Grana  Department: [facility]  Visit Type: MYCHART VIDEO VISIT  Date: 02/11/2024  Medication: amLODipine (NORVASC) 5 MG tablet  Has the patient contacted their pharmacy? Yes, patients pharmacy could not submit the RX refill; instructed patient to initiate RX refill through the provider (Agent: If no, request that the patient contact the pharmacy for the refill. If patient does not wish to contact the pharmacy document the reason why and proceed with request.) (Agent: If yes, when and what did the pharmacy advise?)  Is this the correct pharmacy for this prescription? Yes If no, delete pharmacy and type the correct one.  This is the patient's preferred pharmacy:  [id number], [place] - [address] AT [facility] OF [address] [place] [address] Phone: [phone] Fax: [phone]   Has the prescription been filled recently? No  Is the patient out of the medication? No, patient has 4 pills left  Has the patient been seen for an appointment in the last year OR does the patient have an upcoming appointment? Yes  Can we respond through MyChart? Yes  Agent: Please be advised that Rx refills may take up to 3 business days. We ask that you follow-up with your pharmacy.

## 2024-03-23 ENCOUNTER — Telehealth: Admitting: Family

## 2024-03-23 ENCOUNTER — Encounter: Payer: Self-pay | Admitting: Family

## 2024-03-23 VITALS — Ht 66.0 in | Wt 162.1 lb

## 2024-03-23 DIAGNOSIS — G4700 Insomnia, unspecified: Secondary | ICD-10-CM

## 2024-03-23 DIAGNOSIS — J302 Other seasonal allergic rhinitis: Secondary | ICD-10-CM | POA: Diagnosis not present

## 2024-03-23 DIAGNOSIS — F419 Anxiety disorder, unspecified: Secondary | ICD-10-CM

## 2024-03-23 MED ORDER — ALBUTEROL SULFATE HFA 108 (90 BASE) MCG/ACT IN AERS: INHALATION_SPRAY | RESPIRATORY_TRACT | 1 refills | Status: DC

## 2024-03-23 MED ORDER — TRAZODONE HCL 50 MG PO TABS: 50.0000 mg | ORAL_TABLET | Freq: Every evening | ORAL | 3 refills | Status: AC | PRN

## 2024-03-23 MED ORDER — FLUTICASONE-SALMETEROL 115-21 MCG/ACT IN AERO: INHALATION_SPRAY | RESPIRATORY_TRACT | 1 refills | Status: AC

## 2024-03-23 NOTE — Assessment & Plan Note (Signed)
 Chronic , improved. Continue Prozac 20 mg,BuSpar 5 mg twice daily, trazodone 50mg  qhs. Continue Klonopin 0.5 mg prn.

## 2024-03-23 NOTE — Patient Instructions (Signed)
Nice to see you!   

## 2024-03-23 NOTE — Progress Notes (Signed)
 Virtual Visit via Video Note  I connected with Briana Klein on 03/23/24 at  8:30 AM EDT by a video enabled telemedicine application and verified that I am speaking with the correct person using two identifiers. Location patient: home Location provider: work  Persons participating in the virtual visit: patient, provider  I discussed the limitations of evaluation and management by telemedicine and the availability of in person appointments. The patient expressed understanding and agreed to proceed.  HPI: Follow-up anxiety.    She is feeling much better. She is handling work much better in how she is responding in her new role with work.  She still endorses work stress however she is less tearful.   Compliant with Prozac 20 mg daily.  Compliant with BuSpar 5 mg twice daily.  She is taking Klonopin 0.5 mg as needed. She resumed  trazodone 50mg  which has been helpful for sleep.   She uses albuterol prn during seasonal allergies.   ROS: See pertinent positives and negatives per HPI.  EXAM:  VITALS per patient if applicable: Ht 5\' 6"  (1.676 m)   Wt 162 lb 1.6 oz (73.5 kg)   BMI 26.16 kg/m  BP Readings from Last 3 Encounters:  09/21/23 131/80  04/14/23 118/78  12/23/22 130/80   Wt Readings from Last 3 Encounters:  03/23/24 162 lb 1.6 oz (73.5 kg)  04/14/23 162 lb 1.6 oz (73.5 kg)  12/23/22 159 lb 3.2 oz (72.2 kg)    GENERAL: alert, oriented, appears well and in no acute distress  HEENT: atraumatic, conjunttiva clear, no obvious abnormalities on inspection of external nose and ears  NECK: normal movements of the head and neck  LUNGS: on inspection no signs of respiratory distress, breathing rate appears normal, no obvious gross SOB, gasping or wheezing  CV: no obvious cyanosis  MS: moves all visible extremities without noticeable abnormality  PSYCH/NEURO: pleasant and cooperative, no obvious depression or anxiety, speech and thought processing grossly  intact  ASSESSMENT AND PLAN: Anxiety Assessment & Plan: Chronic , improved. Continue Prozac 20 mg,BuSpar 5 mg twice daily, trazodone 50mg  qhs. Continue Klonopin 0.5 mg prn.     Insomnia, unspecified type -     traZODone HCl; Take 1 tablet (50 mg total) by mouth at bedtime as needed for sleep.  Dispense: 90 tablet; Refill: 3  Seasonal allergies -     Albuterol Sulfate HFA; INHALE 2 PUFFS BY MOUTH INTO THE LUNGS EVERY 6 HOURS AS NEEDED  Dispense: 18 g; Refill: 1 -     Fluticasone-Salmeterol; INHALE 2 PUFFS INTO THE LUNGS TWICE DAILY AS NEEDED  Dispense: 12 g; Refill: 1     -we discussed possible serious and likely etiologies, options for evaluation and workup, limitations of telemedicine visit vs in person visit, treatment, treatment risks and precautions. Pt prefers to treat via telemedicine empirically rather then risking or undertaking an in person visit at this moment.    I discussed the assessment and treatment plan with the patient. The patient was provided an opportunity to ask questions and all were answered. The patient agreed with the plan and demonstrated an understanding of the instructions.   The patient was advised to call back or seek an in-person evaluation if the symptoms worsen or if the condition fails to improve as anticipated.  Advised if desired AVS can be mailed or viewed via MyChart if Mychart user.   Rennie Plowman, FNP

## 2024-03-24 ENCOUNTER — Ambulatory Visit: Payer: Managed Care, Other (non HMO) | Admitting: Family

## 2024-04-17 ENCOUNTER — Encounter: Payer: Managed Care, Other (non HMO) | Admitting: Family

## 2024-05-26 ENCOUNTER — Other Ambulatory Visit: Payer: Self-pay | Admitting: Family

## 2024-05-26 DIAGNOSIS — J302 Other seasonal allergic rhinitis: Secondary | ICD-10-CM

## 2024-06-02 ENCOUNTER — Other Ambulatory Visit: Payer: Self-pay | Admitting: Family

## 2024-06-02 DIAGNOSIS — I10 Essential (primary) hypertension: Secondary | ICD-10-CM

## 2024-06-06 ENCOUNTER — Other Ambulatory Visit: Payer: Self-pay

## 2024-06-06 DIAGNOSIS — I10 Essential (primary) hypertension: Secondary | ICD-10-CM

## 2024-06-06 MED ORDER — AMLODIPINE BESYLATE 5 MG PO TABS
ORAL_TABLET | ORAL | 0 refills | Status: DC
Start: 2024-06-06 — End: 2024-08-28

## 2024-06-06 NOTE — Telephone Encounter (Signed)
 LVM to call back to schedule f/up appt in office, rx sent in to pharmacy

## 2024-08-28 ENCOUNTER — Other Ambulatory Visit: Payer: Self-pay | Admitting: Family

## 2024-08-28 DIAGNOSIS — I10 Essential (primary) hypertension: Secondary | ICD-10-CM

## 2024-09-13 ENCOUNTER — Ambulatory Visit: Admitting: Dermatology

## 2024-09-13 DIAGNOSIS — L820 Inflamed seborrheic keratosis: Secondary | ICD-10-CM

## 2024-09-13 DIAGNOSIS — Z1283 Encounter for screening for malignant neoplasm of skin: Secondary | ICD-10-CM | POA: Diagnosis not present

## 2024-09-13 DIAGNOSIS — D2372 Other benign neoplasm of skin of left lower limb, including hip: Secondary | ICD-10-CM

## 2024-09-13 DIAGNOSIS — L821 Other seborrheic keratosis: Secondary | ICD-10-CM

## 2024-09-13 DIAGNOSIS — L719 Rosacea, unspecified: Secondary | ICD-10-CM

## 2024-09-13 DIAGNOSIS — D485 Neoplasm of uncertain behavior of skin: Secondary | ICD-10-CM

## 2024-09-13 DIAGNOSIS — D229 Melanocytic nevi, unspecified: Secondary | ICD-10-CM

## 2024-09-13 DIAGNOSIS — L578 Other skin changes due to chronic exposure to nonionizing radiation: Secondary | ICD-10-CM | POA: Diagnosis not present

## 2024-09-13 DIAGNOSIS — W908XXA Exposure to other nonionizing radiation, initial encounter: Secondary | ICD-10-CM | POA: Diagnosis not present

## 2024-09-13 DIAGNOSIS — Z872 Personal history of diseases of the skin and subcutaneous tissue: Secondary | ICD-10-CM

## 2024-09-13 DIAGNOSIS — D1801 Hemangioma of skin and subcutaneous tissue: Secondary | ICD-10-CM | POA: Diagnosis not present

## 2024-09-13 DIAGNOSIS — D239 Other benign neoplasm of skin, unspecified: Secondary | ICD-10-CM

## 2024-09-13 DIAGNOSIS — L814 Other melanin hyperpigmentation: Secondary | ICD-10-CM

## 2024-09-13 DIAGNOSIS — L720 Epidermal cyst: Secondary | ICD-10-CM

## 2024-09-13 MED ORDER — METRONIDAZOLE 0.75 % EX CREA: TOPICAL_CREAM | CUTANEOUS | 11 refills | Status: AC

## 2024-09-13 NOTE — Progress Notes (Signed)
 Follow-Up Visit   Subjective  Briana Klein is a 53 y.o. female who presents for the following: Skin Cancer Screening and Full Body Skin Exam  The patient presents for Total-Body Skin Exam (TBSE) for skin cancer screening and mole check. The patient has spots, moles and lesions to be evaluated, some may be new or changing. She has a spot on her left neck that gets caught on necklaces. Also a white bump near her left eye. History of Aks.   The following portions of the chart were reviewed this encounter and updated as appropriate: medications, allergies, medical history  Review of Systems:  No other skin or systemic complaints except as noted in HPI or Assessment and Plan.  Objective  Well appearing patient in no apparent distress; mood and affect are within normal limits.  A full examination was performed including scalp, head, eyes, ears, nose, lips, neck, chest, axillae, abdomen, back, buttocks, bilateral upper extremities, bilateral lower extremities, hands, feet, fingers, toes, fingernails, and toenails. All findings within normal limits unless otherwise noted below.   Relevant physical exam findings are noted in the Assessment and Plan.  Left Neck 3.0 mm red papule  Assessment & Plan   SKIN CANCER SCREENING PERFORMED TODAY.  ACTINIC DAMAGE - Chronic condition, secondary to cumulative UV/sun exposure - diffuse scaly erythematous macules with underlying dyspigmentation - Recommend daily broad spectrum sunscreen SPF 30+ to sun-exposed areas, reapply every 2 hours as needed.  - Staying in the shade or wearing long sleeves, sun glasses (UVA+UVB protection) and wide brim hats (4-inch brim around the entire circumference of the hat) are also recommended for sun protection.  - Call for new or changing lesions.  LENTIGINES, SEBORRHEIC KERATOSES, HEMANGIOMAS - Benign normal skin lesions - Benign-appearing - Call for any changes  MELANOCYTIC NEVI - Tan-brown and/or  pink-flesh-colored symmetric macules and papules - left flank 6 mm x 2 speckled brown macule  - right mid abdomen 1.3 x 0.8 cm reg brown patch  - right calf  3 mm x 2 med brown macules  - left medial ankle 6 mm med dark brown macule  - right posterior flank 8 x 5 mm brown macule with darker edge - right lateral upper knee/ant thigh 4 mm med-dark brown macule  - left mid lower back 1.2 x 0.6 cm speckled brown macule - Benign appearing on exam today. Photos compared from 09/04/2020, stable.  - Observation - Call clinic for new or changing moles - Recommend daily use of broad spectrum spf 30+ sunscreen to sun-exposed areas.   HISTORY OF PRECANCEROUS ACTINIC KERATOSIS - site(s) of PreCancerous Actinic Keratosis clear today. - these may recur and new lesions may form requiring treatment to prevent transformation into skin cancer - observe for new or changing spots and contact [facility] for appointment if occur - photoprotection with sun protective clothing; sunglasses and broad spectrum sunscreen with SPF of at least 30 + and frequent self skin exams recommended - yearly exams by a dermatologist recommended for persons with history of PreCancerous Actinic Keratoses  DERMATOFIBROMA Exam: Firm pink/brown papulenodule with dimple sign at left knee.  Treatment Plan: A dermatofibroma is a benign growth possibly related to trauma, such as an insect bite, cut from shaving, or inflamed acne-type bump.  Treatment options to remove include shave or excision with resulting scar and risk of recurrence.  Since benign-appearing and not bothersome, will observe for now.   INFLAMED SEBORRHEIC KERATOSIS, RESOLVED Exam: Light pink smooth macule at left  upper pretibia.  Benign-appearing.  Call clinic for new or changing lesions.   Milia - tiny firm white papule at left nasal root - type of cyst - benign - sometimes these will clear with nightly OTC adapalene/Differin 0.1% gel or retinol. 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 Condition is bothersome/symptomatic for patient. Currently flared.   Rosacea is a chronic progressive skin condition usually affecting the face of adults, causing redness and/or acne bumps. It is treatable but not curable. It sometimes affects the eyes (ocular rosacea) as well. It may respond to topical and/or systemic medication and can flare with stress, sun exposure, alcohol, exercise, topical steroids (including hydrocortisone/cortisone 10) and some foods.  Daily application of broad spectrum spf 30+ sunscreen to face is recommended to reduce flares.  Treatment Plan Start metronidazole 0.75% cream apply 1-2 times a day dsp 45g 1 yr Rf.  Daily spf 30 to face in AM  NEOPLASM OF UNCERTAIN BEHAVIOR OF SKIN Left Neck Epidermal / dermal shaving  Lesion diameter (cm):  0.3 Informed consent: discussed and consent obtained   Patient was prepped and draped in usual sterile fashion: Area prepped with alcohol. Anesthesia: the lesion was anesthetized in a standard fashion   Anesthetic:  1% lidocaine  w/ epinephrine  1-100,000 buffered w/ 8.4% NaHCO3 Instrument used: flexible razor blade   Hemostasis achieved with: pressure, aluminum chloride and electrodesiccation   Outcome: patient tolerated procedure well   Post-procedure details: wound care instructions given   Post-procedure details comment:  Ointment and small bandage applied  Sent to [facility].  Irritated Hemangioma vs other Related Procedures Anatomic Pathology Report Return in about 1 year (around 09/13/2025) for TBSE, Hx AKs.  IAndrea Kerns, CMA, am acting as scribe for Rexene Rattler, MD .   Documentation: I have reviewed the above documentation for accuracy and completeness, and I agree with the above.  Rexene Rattler, MD

## 2024-09-13 NOTE — Patient Instructions (Addendum)
 Recommend OTC adapalene 0.1% gel pea sized amount to entire face nightly as tolerated.  This can be used to treat acne (whiteheads, blackheads) and milia (tiny firm white cysts).  It may cause dry irritated skin with initial use, and to minimize this, we recommend applying a light moisturizer to face before applying adapalene and/or applying it less frequently.  OTC brands include Differin 0.1% gel (Galderma), Adapalene 0.1% gel (Neutrogena), and Effaclar gel ( La Roche Posay).  They are found in the acne section on the pharmacy.   Wound Care Instructions  Cleanse wound gently with soap and water  once a day then pat dry with clean gauze. Apply a thin coat of Petrolatum (petroleum jelly, Vaseline) over the wound (unless you have an allergy to this). We recommend that you use a new, sterile tube of Vaseline. Do not pick or remove scabs. Do not remove the yellow or white healing tissue from the base of the wound.  Cover the wound with fresh, clean, nonstick gauze and secure with paper tape. You may use Band-Aids in place of gauze and tape if the wound is small enough, but would recommend trimming much of the tape off as there is often too much. Sometimes Band-Aids can irritate the skin.  You should call the office for your biopsy report after 1 week if you have not already been contacted.  If you experience any problems, such as abnormal amounts of bleeding, swelling, significant bruising, significant pain, or evidence of infection, please call the office immediately.  FOR ADULT SURGERY PATIENTS: If you need something for pain relief you may take 1 extra strength Tylenol  (acetaminophen ) AND 2 Ibuprofen (200mg  each) together every 4 hours as needed for pain. (do not take these if you are allergic to them or if you have a reason you should not take them.) Typically, you may only need pain medication for 1 to 3 days.     Rosacea  What is rosacea? Rosacea (say: ro-zay-sha) is a common skin disease  that usually begins as a trend of flushing or blushing easily.  As rosacea progresses, a persistent redness in the center of the face will develop and may gradually spread beyond the nose and cheeks to the forehead and chin.  In some cases, the ears, chest, and back could be affected.  Rosacea may appear as tiny blood vessels or small red bumps that occur in crops.  Frequently they can contain pus, and are called "pustules".  If the bumps do not contain pus, they are referred to as "papules".  Rarely, in prolonged, untreated cases of rosacea, the oil glands of the nose and cheeks may become permanently enlarged.  This is called rhinophyma, and is seen more frequently in men.  Signs and Risks In its beginning stages, rosacea tends to come and go, which makes it difficult to recognize.  It can start as intermittent flushing of the face.  Eventually, blood vessels may become permanently visible.  Pustules and papules can appear, but can be mistaken for adult acne.  People of all races, ages, genders and ethnic groups are at risk of developing rosacea.  However, it is more common in women (especially around menopause) and adults with fair skin between the ages of 98 and 43.  Treatment Dermatologists typically recommend a combination of treatments to effectively manage rosacea.  Treatment can improve symptoms and may stop the progression of the rosacea.  Treatment may involve both topical and oral medications.  The tetracycline antibiotics are often used  for their anti-inflammatory effect; however, because of the possibility of developing antibiotic resistance, they should not be used long term at full dose.  For dilated blood vessels the options include electrodessication (uses electric current through a small needle), laser treatment, and cosmetics to hide the redness.   With all forms of treatment, improvement is a slow process, and patients may not see any results for the first 3-4 weeks.  It is very important  to avoid the sun and other triggers.  Patients must wear sunscreen daily.  Skin Care Instructions: Cleanse the skin with a mild soap such as CeraVe cleanser, Cetaphil cleanser, or Dove soap once or twice daily as needed. Moisturize with Eucerin Redness Relief Daily Perfecting Lotion (has a subtle green tint), CeraVe Moisturizing Cream, or Oil of Olay Daily Moisturizer with sunscreen every morning and/or night as recommended. Makeup should be "non-comedogenic" (won't clog pores) and be labeled "for sensitive skin". Good choices for cosmetics are: Neutrogena, Almay, and Physician's Formula.  Any product with a green tint tends to offset a red complexion. If your eyes are dry and irritated, use artificial tears 2-3 times per day and cleanse the eyelids daily with baby shampoo.  Have your eyes examined at least every 2 years.  Be sure to tell your eye doctor that you have rosacea. Alcoholic beverages tend to cause flushing of the skin, and may make rosacea worse. Always wear sunscreen, protect your skin from extreme hot and cold temperatures, and avoid spicy foods, hot drinks, and mechanical irritation such as rubbing, scrubbing, or massaging the face.  Avoid harsh skin cleansers, cleansing masks, astringents, and exfoliation. If a particular product burns or makes your face feel tight, then it is likely to flare your rosacea. If you are having difficulty finding a sunscreen that you can tolerate, you may try switching to a chemical-free sunscreen.  These are ones whose active ingredient is zinc oxide or titanium dioxide only.  They should also be fragrance free, non-comedogenic, and labeled for sensitive skin. Rosacea triggers may vary from person to person.  There are a variety of foods that have been reported to trigger rosacea.  Some patients find that keeping a diary of what they were doing when they flared helps them avoid triggers.   Due to recent changes in healthcare laws, you may see results of  your pathology and/or laboratory studies on MyChart before the doctors have had a chance to review them. We understand that in some cases there may be results that are confusing or concerning to you. Please understand that not all results are received at the same time and often the doctors may need to interpret multiple results in order to provide you with the best plan of care or course of treatment. Therefore, we ask that you please give us  2 business days to thoroughly review all your results before contacting the office for clarification. Should we see a critical lab result, you will be contacted sooner.   If You Need Anything After Your Visit  If you have any questions or concerns for your doctor, please call our main line at [phone] and press option 4 to reach your doctor's medical assistant. If no one answers, please leave a voicemail as directed and we will return your call as soon as possible. Messages left after 4 pm will be answered the following business day.   You may also send us  a message via MyChart. We typically respond to MyChart messages within 1-2 business days.  For  prescription refills, please ask your pharmacy to contact our office. Our fax number is [phone].  If you have an urgent issue when the clinic is closed that cannot wait until the next business day, you can page your doctor at the number below.    Please note that while we do our best to be available for urgent issues outside of office hours, we are not available 24/7.   If you have an urgent issue and are unable to reach us , you may choose to seek medical care at your doctor's office, retail clinic, urgent care center, or emergency room.  If you have a medical emergency, please immediately call 911 or go to the emergency department.  Pager Numbers  - Dr. Hester: [phone]  - Dr. Jackquline: [phone]  - Dr. Claudene: [phone]   - Dr. Raymund: [phone]  In the event of inclement weather,  please call our main line at [phone] for an update on the status of any delays or closures.  Dermatology Medication Tips: Please keep the boxes that topical medications come in in order to help keep track of the instructions about where and how to use these. Pharmacies typically print the medication instructions only on the boxes and not directly on the medication tubes.   If your medication is too expensive, please contact our office at [phone] option 4 or send us  a message through MyChart.   We are unable to tell what your co-pay for medications will be in advance as this is different depending on your insurance coverage. However, we may be able to find a substitute medication at lower cost or fill out paperwork to get insurance to cover a needed medication.   If a prior authorization is required to get your medication covered by your insurance company, please allow us  1-2 business days to complete this process.  Drug prices often vary depending on where the prescription is filled and some pharmacies may offer cheaper prices.  The website www.goodrx.com contains coupons for medications through different pharmacies. The prices here do not account for what the cost may be with help from insurance (it may be cheaper with your insurance), but the website can give you the price if you did not use any insurance.  - You can print the associated coupon and take it with your prescription to the pharmacy.  - You may also stop by our office during regular business hours and pick up a GoodRx coupon card.  - If you need your prescription sent electronically to a different pharmacy, notify our office through [facility] or by phone at [phone] option 4.     Si Usted Necesita Algo Despus de Su Visita  Tambin puede enviarnos un mensaje a travs de Clinical cytogeneticist. Por lo general respondemos a los mensajes de MyChart en el transcurso de 1 a 2 das hbiles.  Para renovar recetas, por favor  pida a su farmacia que se ponga en contacto con nuestra oficina. Randi lakes de fax es [place] [phone].  Si tiene un asunto urgente cuando la clnica est cerrada y que no puede esperar hasta el siguiente da hbil, puede llamar/localizar a su doctor(a) al nmero que aparece a continuacin.   Por favor, tenga en cuenta que aunque hacemos todo lo posible para estar disponibles para asuntos urgentes fuera del horario de [place], no estamos disponibles las 24 horas del da, los 7 [address]  [address] de la [place].   Si tiene un problema urgente y no puede comunicarse con nosotros, puede optar por  buscar atencin mdica  en el consultorio de su doctor(a), en una clnica privada, en un centro de atencin urgente o en una sala de emergencias.  Si tiene Engineer, drilling, por favor llame inmediatamente al 911 o vaya a la sala de emergencias.  Nmeros de bper  - Dr. Hester: [phone]  - Dra. Jackquline: [phone]  - Dr. Claudene: [phone]  - Dra. Kitts: [phone]  En caso de inclemencias del [place], por favor llame a nuestra lnea principal al [phone] para una actualizacin sobre el estado de cualquier retraso o cierre.  Consejos para la medicacin en dermatologa: Por favor, guarde las cajas en las que vienen los medicamentos de uso tpico para ayudarle a seguir las instrucciones sobre dnde y cmo usarlos. Las farmacias generalmente imprimen las instrucciones del medicamento slo en las cajas y no directamente en los tubos del [place].   Si su medicamento es muy caro, por favor, pngase en contacto con landry rieger llamando al [phone] y presione la opcin 4 o envenos un mensaje a travs de Clinical cytogeneticist.   No podemos decirle cul ser su copago por los medicamentos por adelantado ya que esto es diferente dependiendo de la cobertura de su seguro. Sin embargo, es posible que podamos encontrar un medicamento sustituto a Audiological scientist un formulario para que el seguro cubra el  medicamento que se considera necesario.   Si se requiere una autorizacin previa para que su compaa de seguros malta su medicamento, por favor permtanos de 1 a 2 das hbiles para completar este proceso.  Los precios de los medicamentos varan con frecuencia dependiendo del Environmental consultant de dnde se surte la receta y alguna farmacias pueden ofrecer precios ms baratos.  El sitio web www.goodrx.com tiene cupones para medicamentos de Health and safety inspector. Los precios aqu no tienen en cuenta lo que podra costar con la ayuda del seguro (puede ser ms barato con su seguro), pero el sitio web puede darle el precio si no utiliz Tourist information centre manager.  - Puede imprimir el cupn correspondiente y llevarlo con su receta a la farmacia.  - Tambin puede pasar por nuestra oficina durante el horario de atencin regular y Education officer, museum una tarjeta de cupones de GoodRx.  - Si necesita que su receta se enve electrnicamente a una farmacia diferente, informe a nuestra oficina a travs de MyChart de [place] o por telfono llamando al [phone] y presione la opcin 4.

## 2024-09-20 ENCOUNTER — Ambulatory Visit: Payer: Self-pay | Admitting: Dermatology

## 2024-09-20 LAB — ANATOMIC PATHOLOGY REPORT

## 2024-09-20 NOTE — Telephone Encounter (Signed)
-----   Message from Rexene Rattler sent at 09/20/2024  8:25 AM EDT ----- left neck ,Skin Biopsy: BENIGN ANGIOMA.  - please call patient ----- Message ----- From: Rebecka Memos Lab Results In Sent: 09/20/2024   7:36 AM EDT To: Rexene Rattler, MD

## 2024-09-20 NOTE — Telephone Encounter (Signed)
 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 Patient to call with any questions or concerns.

## 2024-09-26 ENCOUNTER — Encounter: Payer: Managed Care, Other (non HMO) | Admitting: Dermatology

## 2024-11-26 ENCOUNTER — Other Ambulatory Visit: Payer: Self-pay | Admitting: Family

## 2024-11-26 DIAGNOSIS — I10 Essential (primary) hypertension: Secondary | ICD-10-CM

## 2024-11-27 ENCOUNTER — Telehealth: Payer: Self-pay

## 2024-11-27 NOTE — Telephone Encounter (Signed)
 2nd attempt to contact pt did not leave a 2nd vm

## 2024-11-27 NOTE — Telephone Encounter (Signed)
 Detailed vm left informing pt to CB to schedule an ain person office visit with pcp as last in person OV was 04-14-2023. Informed her that a  refill can be sent in once she has called and schedule an appt. (Only enough to cover until that scheduled appt)

## 2024-11-27 NOTE — Telephone Encounter (Signed)
 Detailed vm left informing pt to CB to schedule an ain person office visit with pcp as last in person OV was 04-14-2023. Informed her that a  refill can be sent in once she has called and schedule an appt. (Only enough to cover until that scheduled appt)    E2C2 PLEASE HAVE PT SCHEDULED AN OV AND RELAY MSG ABOVE

## 2024-11-28 NOTE — Telephone Encounter (Signed)
 LVM to call back to schedule f/up appt with Rollene

## 2024-12-28 ENCOUNTER — Ambulatory Visit: Admitting: Family

## 2024-12-28 ENCOUNTER — Encounter: Payer: Self-pay | Admitting: Family

## 2024-12-28 VITALS — BP 130/78 | HR 87 | Temp 97.7°F | Ht 66.0 in | Wt 161.4 lb

## 2024-12-28 DIAGNOSIS — Z1231 Encounter for screening mammogram for malignant neoplasm of breast: Secondary | ICD-10-CM | POA: Diagnosis not present

## 2024-12-28 DIAGNOSIS — Z0000 Encounter for general adult medical examination without abnormal findings: Secondary | ICD-10-CM | POA: Diagnosis not present

## 2024-12-28 DIAGNOSIS — I10 Essential (primary) hypertension: Secondary | ICD-10-CM | POA: Diagnosis not present

## 2024-12-28 DIAGNOSIS — F419 Anxiety disorder, unspecified: Secondary | ICD-10-CM

## 2024-12-28 DIAGNOSIS — Z23 Encounter for immunization: Secondary | ICD-10-CM | POA: Diagnosis not present

## 2024-12-28 MED ORDER — FLUOXETINE HCL 20 MG PO CAPS: 20.0000 mg | ORAL_CAPSULE | Freq: Every morning | ORAL | 3 refills | Status: AC

## 2024-12-28 MED ORDER — BUSPIRONE HCL 5 MG PO TABS: 5.0000 mg | ORAL_TABLET | Freq: Two times a day (BID) | ORAL | 3 refills | Status: AC

## 2024-12-28 MED ORDER — AMLODIPINE BESYLATE 5 MG PO TABS: ORAL_TABLET | ORAL | 3 refills | Status: AC

## 2024-12-28 NOTE — Progress Notes (Unsigned)
 "  Assessment & Plan:  Routine general medical examination at a health care facility Assessment & Plan: Discussed the importance of regular exercise and maintaining physical activity. Encouraged to engage in at least 150 minutes of moderate exercise per week.  Deferred pelvic exam in the absence of complaints and Pap smear is up-to-date.  Patient will schedule mammogram  Orders: -     FLUoxetine  HCl; Take 1 capsule (20 mg total) by mouth every morning.  Dispense: 90 capsule; Refill: 3 -     amLODIPine  Besylate; TAKE 1 TABLET(5 MG) BY MOUTH DAILY  Dispense: 90 tablet; Refill: 3 -     busPIRone  HCl; Take 1 tablet (5 mg total) by mouth 2 (two) times daily.  Dispense: 180 tablet; Refill: 3 -     VITAMIN D  25 Hydroxy (Vit-D Deficiency, Fractures) -     Hemoglobin A1c -     CBC with Differential/Platelet -     Comprehensive metabolic panel with GFR -     Lipid panel -     TSH  Encounter for screening mammogram for malignant neoplasm of breast -     3D Screening Mammogram, Left and Right; Future  Need for influenza vaccination  Anxiety Assessment & Plan: Generalized anxiety disorder   She experiences stress and anxiety related to new position at work, menopause and life changes. Previously on Prozac  20 mg, she discontinued it due to concerns about long-term use and potential side effects. Currently, she takes Buspar  5 mg twice daily. Considering restarting Prozac  to manage symptoms, especially with a new job and stressors. Discussed Prozac 's benefits in managing anxiety and mood, and its safety profile. Emphasized prioritizing mental health and the potential benefits of Prozac  in supplementing Buspar . Refilled Prozac  20 mg with a 90-day supply and three refills. Continue Buspar  5 mg twice daily.  Close follow-up   Orders: -     FLUoxetine  HCl; Take 1 capsule (20 mg total) by mouth every morning.  Dispense: 90 capsule; Refill: 3 -     busPIRone  HCl; Take 1 tablet (5 mg total) by mouth 2 (two)  times daily.  Dispense: 180 tablet; Refill: 3  Essential hypertension -     amLODIPine  Besylate; TAKE 1 TABLET(5 MG) BY MOUTH DAILY  Dispense: 90 tablet; Refill: 3     Return precautions given.   Risks, benefits, and alternatives of the medications and treatment plan prescribed today were discussed, and patient expressed understanding.   Education regarding symptom management and diagnosis given to patient on AVS either electronically or printed.  Return in about 6 weeks (around 02/08/2025).  Rollene Northern, FNP  Subjective:    Patient ID: Garen JONETTA Largo, female    DOB: 1971/02/07, 54 y.o.   MRN: [id number]  CC: Avrianna D Thies is a 54 y.o. female who presents today for physical exam and follow up anxiety and depression.    HPI: HPI Discussed the use of AI scribe software for clinical note transcription with the patient, who gave verbal consent to proceed.  History of Present Illness   Shivali D Lapidus is a 54 year old female who presents for an annual physical exam with concerns about depression.   She experiences symptoms she attributes to menopause, including feeling down, having no interest, and feeling blue. She is uncertain if these symptoms are also related to seasonal changes, as she has a history of seasonal depression. The past year has been very stressful, particularly due to a new job that she finds challenging and demanding.  She is currently taking Buspar  5 mg twice a day for anxiety and has discontinued Prozac  20 mg, which she previously used. She is concerned about the long-term use of Prozac , although she did not experience any adverse effects while on it. She is considering whether to resume Prozac  due to ongoing stress and anxiety related to her job and personal life.  Her husband's job instability adds to her stress. She is also concerned about the impact of menopause on her mental health and is exploring whether her symptoms are related to hormonal changes.  Her  current exercise routine includes occasional dog walks and using a pedal exerciser under her desk for one to two hours daily. She acknowledges a lack of interest in regular exercise but tries to stay active through these means.       Colorectal Cancer Screening: UTD , Dr Jinny, repeat in 10 yrs Breast Cancer Screening: Mammogram due Cervical Cancer Screening: UTD, 04/14/23 neg hpv, NILM          Tetanus - UTD         Exercise: Occasional dog walks.  Alcohol use:  occasioknal Smoking/tobacco use: Nonsmoker.            Health Maintenance  Topic Date Due   Pneumococcal Vaccine for age over 79 (1 of 2 - PCV) Never done   Hepatitis B Vaccine (1 of 3 - 19+ 3-dose series) Never done   Breast Cancer Screening  09/04/2021   COVID-19 Vaccine (4 - 2025-26 season) 08/14/2024   Flu Shot  03/13/2025*   Zoster (Shingles) Vaccine (1 of 2) 03/28/2025*   Pap with HPV screening  04/13/2028   DTaP/Tdap/Td vaccine (3 - Td or Tdap) 03/27/2030   Colon Cancer Screening  03/30/2032   HPV Vaccine (No Doses Required) Completed   Hepatitis C Screening  Completed   HIV Screening  Completed   Meningitis B Vaccine  Aged Out  *Topic was postponed. The date shown is not the original due date.    ALLERGIES: Amoxicillin-pot clavulanate  Medications Ordered Prior to Encounter[1]  Review of Systems  Constitutional:  Negative for chills, fever and unexpected weight change.  HENT:  Negative for congestion.   Respiratory:  Negative for cough.   Cardiovascular:  Negative for chest pain, palpitations and leg swelling.  Gastrointestinal:  Negative for nausea and vomiting.  Musculoskeletal:  Negative for arthralgias and myalgias.  Skin:  Negative for rash.  Neurological:  Negative for headaches.  Hematological:  Negative for adenopathy.  Psychiatric/Behavioral:  Negative for confusion and suicidal ideas.       Objective:    There were no vitals taken for this visit.  BP Readings from Last 3 Encounters:   09/21/23 131/80  04/14/23 118/78  12/23/22 130/80   Wt Readings from Last 3 Encounters:  03/23/24 162 lb 1.6 oz (73.5 kg)  04/14/23 162 lb 1.6 oz (73.5 kg)  12/23/22 159 lb 3.2 oz (72.2 kg)      12/28/2024    9:21 AM 03/23/2024    8:22 AM 03/23/2024    8:14 AM 02/11/2024    8:22 AM 04/14/2023    3:12 PM  Depression screen PHQ 2/9  Decreased Interest 2 0 0 2 0  Down, Depressed, Hopeless 2 0 0 0 0  PHQ - 2 Score 4 0 0 2 0  Altered sleeping 1 0 0 3   Tired, decreased energy 3 0 0 3   Change in appetite 0 0 0 2   Feeling bad  or failure about yourself  1 0 0 2   Trouble concentrating 1 0 0 2   Moving slowly or fidgety/restless 0 0 0 2   Suicidal thoughts 0 0 0 0   PHQ-9 Score 10 0  0  16    Difficult doing work/chores Somewhat difficult Not difficult at all Not difficult at all Very difficult      Data saved with a previous flowsheet row definition      12/28/2024    9:21 AM 03/23/2024    8:14 AM 02/11/2024    8:25 AM 02/18/2022    3:13 PM  GAD 7 : Generalized Anxiety Score  Nervous, Anxious, on Edge 2 0 3 0  Control/stop worrying 2 0 2 0  Worry too much - different things 3 0 1 0  Trouble relaxing 2 0 3 0  Restless 0 0 1 0  Easily annoyed or irritable 1 0 1 0  Afraid - awful might happen 0 0 1 0  Total GAD 7 Score 10 0 12 0  Anxiety Difficulty Somewhat difficult Not difficult at all Very difficult Not difficult at all      Physical Exam Vitals reviewed.  Constitutional:      Appearance: Normal appearance. She is well-developed.  Eyes:     Conjunctiva/sclera: Conjunctivae normal.  Neck:     Thyroid : No thyroid  mass or thyromegaly.  Cardiovascular:     Rate and Rhythm: Normal rate and regular rhythm.     Pulses: Normal pulses.     Heart sounds: Normal heart sounds.  Pulmonary:     Effort: Pulmonary effort is normal.     Breath sounds: Normal breath sounds. No wheezing, rhonchi or rales.  Chest:  Breasts:    Breasts are symmetrical.     Right: No inverted nipple,  mass, nipple discharge, skin change or tenderness.     Left: No inverted nipple, mass, nipple discharge, skin change or tenderness.  Abdominal:     General: Bowel sounds are normal. There is no distension.     Palpations: Abdomen is soft. Abdomen is not rigid. There is no fluid wave or mass.     Tenderness: There is no abdominal tenderness. There is no guarding or rebound.  Lymphadenopathy:     Head:     Right side of head: No submental, submandibular, tonsillar, preauricular, posterior auricular or occipital adenopathy.     Left side of head: No submental, submandibular, tonsillar, preauricular, posterior auricular or occipital adenopathy.     Cervical: No cervical adenopathy.     Right cervical: No superficial, deep or posterior cervical adenopathy.    Left cervical: No superficial, deep or posterior cervical adenopathy.  Skin:    General: Skin is warm and dry.  Neurological:     Mental Status: She is alert.  Psychiatric:        Speech: Speech normal.        Behavior: Behavior normal.        Thought Content: Thought content normal.            [1]  Current Outpatient Medications on File Prior to Visit  Medication Sig Dispense Refill   acetaminophen  (TYLENOL ) 325 MG tablet Take 650 mg by mouth every 6 (six) hours as needed for moderate pain.     albuterol  (VENTOLIN  HFA) 108 (90 Base) MCG/ACT inhaler INHALE 2 PUFFS BY MOUTH EVERY 6 HOURS AS NEEDED 18 g 1   aspirin-acetaminophen -caffeine (EXCEDRIN MIGRAINE) 250-250-65 MG tablet Take by mouth every  6 (six) hours as needed for headache.     clonazePAM  (KLONOPIN ) 0.5 MG tablet Take 1 tablet (0.5 mg total) by mouth every evening. 30 tablet 2   fexofenadine  (ALLEGRA ) 180 MG tablet Take 180 mg by mouth as needed for allergies or rhinitis.     fluticasone -salmeterol (ADVAIR  HFA) 115-21 MCG/ACT inhaler INHALE 2 PUFFS INTO THE LUNGS TWICE DAILY AS NEEDED 12 g 1   metroNIDAZOLE  (METROCREAM ) 0.75 % cream Apply to face one to two times a day  for rosacea. 45 g 11   traZODone  (DESYREL ) 50 MG tablet Take 1 tablet (50 mg total) by mouth at bedtime as needed for sleep. 90 tablet 3   No current facility-administered medications on file prior to visit.   "

## 2024-12-29 ENCOUNTER — Ambulatory Visit: Payer: Self-pay | Admitting: Family

## 2024-12-29 DIAGNOSIS — R7989 Other specified abnormal findings of blood chemistry: Secondary | ICD-10-CM

## 2024-12-29 LAB — CBC WITH DIFFERENTIAL/PLATELET
Basophils Absolute: 0.1 x10E3/uL (ref 0.0–0.2)
Basos: 2 %
EOS (ABSOLUTE): 0.1 x10E3/uL (ref 0.0–0.4)
Eos: 3 %
Hematocrit: 42.3 % (ref 34.0–46.6)
Hemoglobin: 14.2 g/dL (ref 11.1–15.9)
Immature Grans (Abs): 0 x10E3/uL (ref 0.0–0.1)
Immature Granulocytes: 0 %
Lymphocytes Absolute: 1.5 x10E3/uL (ref 0.7–3.1)
Lymphs: 29 %
MCH: 30.5 pg (ref 26.6–33.0)
MCHC: 33.6 g/dL (ref 31.5–35.7)
MCV: 91 fL (ref 79–97)
Monocytes Absolute: 0.4 x10E3/uL (ref 0.1–0.9)
Monocytes: 7 %
Neutrophils Absolute: 3.2 x10E3/uL (ref 1.4–7.0)
Neutrophils: 59 %
Platelets: 231 x10E3/uL (ref 150–450)
RBC: 4.65 x10E6/uL (ref 3.77–5.28)
RDW: 14.5 % (ref 11.7–15.4)
WBC: 5.3 x10E3/uL (ref 3.4–10.8)

## 2024-12-29 LAB — COMPREHENSIVE METABOLIC PANEL WITH GFR
ALT: 15 IU/L (ref 0–32)
AST: 18 IU/L (ref 0–40)
Albumin: 4.8 g/dL (ref 3.8–4.9)
Alkaline Phosphatase: 93 IU/L (ref 49–135)
BUN/Creatinine Ratio: 16 (ref 9–23)
BUN: 15 mg/dL (ref 6–24)
Bilirubin Total: 0.9 mg/dL (ref 0.0–1.2)
CO2: 23 mmol/L (ref 20–29)
Calcium: 9.8 mg/dL (ref 8.7–10.2)
Chloride: 105 mmol/L (ref 96–106)
Creatinine, Ser: 0.93 mg/dL (ref 0.57–1.00)
Globulin, Total: 2.9 g/dL (ref 1.5–4.5)
Glucose: 103 mg/dL — ABNORMAL HIGH (ref 70–99)
Potassium: 3.9 mmol/L (ref 3.5–5.2)
Sodium: 142 mmol/L (ref 134–144)
Total Protein: 7.7 g/dL (ref 6.0–8.5)
eGFR: 73 mL/min/1.73

## 2024-12-29 LAB — HEMOGLOBIN A1C
Est. average glucose Bld gHb Est-mCnc: 94 mg/dL
Hgb A1c MFr Bld: 4.9 % (ref 4.8–5.6)

## 2024-12-29 LAB — LIPID PANEL
Chol/HDL Ratio: 3.6 ratio (ref 0.0–4.4)
Cholesterol, Total: 221 mg/dL — ABNORMAL HIGH (ref 100–199)
HDL: 62 mg/dL
LDL Chol Calc (NIH): 138 mg/dL — ABNORMAL HIGH (ref 0–99)
Triglycerides: 120 mg/dL (ref 0–149)
VLDL Cholesterol Cal: 21 mg/dL (ref 5–40)

## 2024-12-29 LAB — TSH: TSH: 4.77 u[IU]/mL — ABNORMAL HIGH (ref 0.450–4.500)

## 2024-12-29 LAB — VITAMIN D 25 HYDROXY (VIT D DEFICIENCY, FRACTURES): Vit D, 25-Hydroxy: 23.5 ng/mL — ABNORMAL LOW (ref 30.0–100.0)

## 2024-12-29 NOTE — Patient Instructions (Signed)
 As discussed today, I would strongly encourage you to resume Prozac  20 mg daily.  I think that things may feel more steady for you overall, especially with your new position at work.  Please let me know how you are doing.  Health Maintenance for Postmenopausal Women Menopause is a normal process in which your ability to get pregnant comes to an end. This process happens slowly over many months or years, usually between the ages of 36 and 55. Menopause is complete when you have missed your menstrual period for 12 months. It is important to talk with your health care provider about some of the most common conditions that affect women after menopause (postmenopausal women). These include heart disease, cancer, and bone loss (osteoporosis). Adopting a healthy lifestyle and getting preventive care can help to promote your health and wellness. The actions you take can also lower your chances of developing some of these common conditions. What are the signs and symptoms of menopause? During menopause, you may have the following symptoms: Hot flashes. These can be moderate or severe. Night sweats. Decrease in sex drive. Mood swings. Headaches. Tiredness (fatigue). Irritability. Memory problems. Problems falling asleep or staying asleep. Talk with your health care provider about treatment options for your symptoms. Do I need hormone replacement therapy? Hormone replacement therapy is effective in treating symptoms that are caused by menopause, such as hot flashes and night sweats. Hormone replacement carries certain risks, especially as you become older. If you are thinking about using estrogen or estrogen with progestin, discuss the benefits and risks with your health care provider. How can I reduce my risk for heart disease and stroke? The risk of heart disease, heart attack, and stroke increases as you age. One of the causes may be a change in the body's hormones during menopause. This can affect how  your body uses dietary fats, triglycerides, and cholesterol. Heart attack and stroke are medical emergencies. There are many things that you can do to help prevent heart disease and stroke. Watch your blood pressure High blood pressure causes heart disease and increases the risk of stroke. This is more likely to develop in people who have high blood pressure readings or are overweight. Have your blood pressure checked: Every 3-5 years if you are 36-69 years of age. Every year if you are 25 years old or older. Eat a healthy diet  Eat a diet that includes plenty of vegetables, fruits, low-fat dairy products, and lean protein. Do not eat a lot of foods that are high in solid fats, added sugars, or sodium. Get regular exercise Get regular exercise. This is one of the most important things you can do for your health. Most adults should: Try to exercise for at least 150 minutes each week. The exercise should increase your heart rate and make you sweat (moderate-intensity exercise). Try to do strengthening exercises at least twice each week. Do these in addition to the moderate-intensity exercise. Spend less time sitting. Even light physical activity can be beneficial. Other tips Work with your health care provider to achieve or maintain a healthy weight. Do not use any products that contain nicotine or tobacco. These products include cigarettes, chewing tobacco, and vaping devices, such as e-cigarettes. If you need help quitting, ask your health care provider. Know your numbers. Ask your health care provider to check your cholesterol and your blood sugar (glucose). Continue to have your blood tested as directed by your health care provider. Do I need screening for cancer? Depending on  your health history and family history, you may need to have cancer screenings at different stages of your life. This may include screening for: Breast cancer. Cervical cancer. Lung cancer. Colorectal cancer. What is  my risk for osteoporosis? After menopause, you may be at increased risk for osteoporosis. Osteoporosis is a condition in which bone destruction happens more quickly than new bone creation. To help prevent osteoporosis or the bone fractures that can happen because of osteoporosis, you may take the following actions: If you are 65-76 years old, get at least 1,000 mg of calcium and at least 600 international units (IU) of vitamin D  per day. If you are older than age 58 but younger than age 54, get at least 1,200 mg of calcium and at least 600 international units (IU) of vitamin D  per day. If you are older than age 22, get at least 1,200 mg of calcium and at least 800 international units (IU) of vitamin D  per day. Smoking and drinking excessive alcohol increase the risk of osteoporosis. Eat foods that are rich in calcium and vitamin D , and do weight-bearing exercises several times each week as directed by your health care provider. How does menopause affect my mental health? Depression may occur at any age, but it is more common as you become older. Common symptoms of depression include: Feeling depressed. Changes in sleep patterns. Changes in appetite or eating patterns. Feeling an overall lack of motivation or enjoyment of activities that you previously enjoyed. Frequent crying spells. Talk with your health care provider if you think that you are experiencing any of these symptoms. General instructions See your health care provider for regular wellness exams and vaccines. This may include: Scheduling regular health, dental, and eye exams. Getting and maintaining your vaccines. These include: Influenza vaccine. Get this vaccine each year before the flu season begins. Pneumonia vaccine. Shingles vaccine. Tetanus, diphtheria, and pertussis (Tdap) booster vaccine. Your health care provider may also recommend other immunizations. Tell your health care provider if you have ever been abused or do not  feel safe at home. Summary Menopause is a normal process in which your ability to get pregnant comes to an end. This condition causes hot flashes, night sweats, decreased interest in sex, mood swings, headaches, or lack of sleep. Treatment for this condition may include hormone replacement therapy. Take actions to keep yourself healthy, including exercising regularly, eating a healthy diet, watching your weight, and checking your blood pressure and blood sugar levels. Get screened for cancer and depression. Make sure that you are up to date with all your vaccines. This information is not intended to replace advice given to you by your health care provider. Make sure you discuss any questions you have with your health care provider. Document Revised: 04/21/2021 Document Reviewed: 04/21/2021 Elsevier Patient Education  2024 [employer].

## 2024-12-29 NOTE — Assessment & Plan Note (Signed)
 Discussed the importance of regular exercise and maintaining physical activity. Encouraged to engage in at least 150 minutes of moderate exercise per week.  Deferred pelvic exam in the absence of complaints and Pap smear is up-to-date.  Patient will schedule mammogram

## 2024-12-29 NOTE — Assessment & Plan Note (Addendum)
 Generalized anxiety disorder   She experiences stress and anxiety related to new position at work, menopause and life changes. Previously on Prozac  20 mg, she discontinued it due to concerns about long-term use and potential side effects. Currently, she takes Buspar  5 mg twice daily. Considering restarting Prozac  to manage symptoms, especially with a new job and stressors. Discussed Prozac 's benefits in managing anxiety and mood, and its safety profile. Emphasized prioritizing mental health and the potential benefits of Prozac  in supplementing Buspar . Refilled Prozac  20 mg with a 90-day supply and three refills. Continue Buspar  5 mg twice daily.  Close follow-up

## 2024-12-31 ENCOUNTER — Other Ambulatory Visit: Payer: Self-pay | Admitting: Family

## 2024-12-31 DIAGNOSIS — Z0000 Encounter for general adult medical examination without abnormal findings: Secondary | ICD-10-CM

## 2024-12-31 DIAGNOSIS — I10 Essential (primary) hypertension: Secondary | ICD-10-CM

## 2025-02-08 ENCOUNTER — Ambulatory Visit: Admitting: Family

## 2025-08-13 ENCOUNTER — Ambulatory Visit: Admitting: Dermatology
